# Patient Record
Sex: Female | Born: 1950
Health system: Southern US, Community
[De-identification: ages and names within clinical notes are randomized; demographics above are authoritative.]

## PROBLEM LIST (undated history)

## (undated) DIAGNOSIS — Z8051 Family history of malignant neoplasm of kidney: Secondary | ICD-10-CM

## (undated) DIAGNOSIS — F329 Major depressive disorder, single episode, unspecified: Secondary | ICD-10-CM

## (undated) DIAGNOSIS — Z803 Family history of malignant neoplasm of breast: Secondary | ICD-10-CM

## (undated) DIAGNOSIS — R945 Abnormal results of liver function studies: Secondary | ICD-10-CM

## (undated) DIAGNOSIS — R600 Localized edema: Secondary | ICD-10-CM

## (undated) DIAGNOSIS — R7303 Prediabetes: Secondary | ICD-10-CM

## (undated) DIAGNOSIS — K829 Disease of gallbladder, unspecified: Secondary | ICD-10-CM

## (undated) DIAGNOSIS — F419 Anxiety disorder, unspecified: Secondary | ICD-10-CM

## (undated) DIAGNOSIS — I10 Essential (primary) hypertension: Secondary | ICD-10-CM

## (undated) DIAGNOSIS — E785 Hyperlipidemia, unspecified: Secondary | ICD-10-CM

## (undated) DIAGNOSIS — R519 Headache, unspecified: Secondary | ICD-10-CM

## (undated) DIAGNOSIS — F32A Depression, unspecified: Secondary | ICD-10-CM

## (undated) DIAGNOSIS — K5900 Constipation, unspecified: Secondary | ICD-10-CM

## (undated) DIAGNOSIS — R413 Other amnesia: Secondary | ICD-10-CM

## (undated) DIAGNOSIS — H269 Unspecified cataract: Secondary | ICD-10-CM

## (undated) DIAGNOSIS — M170 Bilateral primary osteoarthritis of knee: Secondary | ICD-10-CM

## (undated) DIAGNOSIS — Z8042 Family history of malignant neoplasm of prostate: Secondary | ICD-10-CM

## (undated) DIAGNOSIS — R7989 Other specified abnormal findings of blood chemistry: Secondary | ICD-10-CM

## (undated) DIAGNOSIS — Z87898 Personal history of other specified conditions: Secondary | ICD-10-CM

## (undated) DIAGNOSIS — M8580 Other specified disorders of bone density and structure, unspecified site: Secondary | ICD-10-CM

## (undated) DIAGNOSIS — E669 Obesity, unspecified: Secondary | ICD-10-CM

## (undated) HISTORY — DX: Other specified disorders of bone density and structure, unspecified site: M85.80

## (undated) HISTORY — DX: Family history of malignant neoplasm of breast: Z80.3

## (undated) HISTORY — DX: Abnormal results of liver function studies: R94.5

## (undated) HISTORY — DX: Essential (primary) hypertension: I10

## (undated) HISTORY — DX: Family history of malignant neoplasm of kidney: Z80.51

## (undated) HISTORY — DX: Bilateral primary osteoarthritis of knee: M17.0

## (undated) HISTORY — DX: Other specified abnormal findings of blood chemistry: R79.89

## (undated) HISTORY — DX: Family history of malignant neoplasm of prostate: Z80.42

## (undated) HISTORY — DX: Personal history of other specified conditions: Z87.898

## (undated) HISTORY — DX: Localized edema: R60.0

## (undated) HISTORY — DX: Hyperlipidemia, unspecified: E78.5

## (undated) HISTORY — DX: Constipation, unspecified: K59.00

## (undated) HISTORY — DX: Obesity, unspecified: E66.9

## (undated) HISTORY — DX: Prediabetes: R73.03

## (undated) HISTORY — DX: Disease of gallbladder, unspecified: K82.9

## (undated) HISTORY — DX: Depression, unspecified: F32.A

---
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
## 1983-09-27 HISTORY — PX: ABDOMINAL HYSTERECTOMY: SHX81

## 1983-09-27 HISTORY — PX: CHOLECYSTECTOMY: SHX55

## 1983-09-27 HISTORY — PX: APPENDECTOMY: SHX54

## 1998-05-18 ENCOUNTER — Encounter: Admission: RE | Admit: 1998-05-18 | Discharge: 1998-05-18 | Payer: Self-pay | Admitting: *Deleted

## 2000-11-08 ENCOUNTER — Emergency Department (HOSPITAL_COMMUNITY): Admission: EM | Admit: 2000-11-08 | Discharge: 2000-11-08 | Payer: Self-pay | Admitting: Emergency Medicine

## 2000-11-20 ENCOUNTER — Encounter: Admission: RE | Admit: 2000-11-20 | Discharge: 2000-11-20 | Payer: Self-pay | Admitting: Internal Medicine

## 2000-12-19 ENCOUNTER — Ambulatory Visit (HOSPITAL_COMMUNITY): Admission: RE | Admit: 2000-12-19 | Discharge: 2000-12-19 | Payer: Self-pay | Admitting: Orthopedic Surgery

## 2000-12-26 ENCOUNTER — Encounter: Admission: RE | Admit: 2000-12-26 | Discharge: 2000-12-26 | Payer: Self-pay | Admitting: Orthopedic Surgery

## 2003-01-13 ENCOUNTER — Ambulatory Visit (HOSPITAL_COMMUNITY): Admission: RE | Admit: 2003-01-13 | Discharge: 2003-01-13 | Payer: Self-pay | Admitting: Obstetrics and Gynecology

## 2003-01-13 ENCOUNTER — Encounter: Payer: Self-pay | Admitting: Obstetrics and Gynecology

## 2004-09-08 ENCOUNTER — Ambulatory Visit: Payer: Self-pay | Admitting: Internal Medicine

## 2004-09-23 ENCOUNTER — Ambulatory Visit: Payer: Self-pay | Admitting: Internal Medicine

## 2004-09-29 ENCOUNTER — Ambulatory Visit: Payer: Self-pay | Admitting: *Deleted

## 2004-10-05 ENCOUNTER — Ambulatory Visit: Payer: Self-pay | Admitting: Internal Medicine

## 2004-10-11 ENCOUNTER — Ambulatory Visit: Payer: Self-pay | Admitting: Internal Medicine

## 2004-10-14 ENCOUNTER — Ambulatory Visit (HOSPITAL_COMMUNITY): Admission: RE | Admit: 2004-10-14 | Discharge: 2004-10-14 | Payer: Self-pay | Admitting: Family Medicine

## 2005-08-22 ENCOUNTER — Ambulatory Visit: Payer: Self-pay | Admitting: Internal Medicine

## 2005-08-29 ENCOUNTER — Ambulatory Visit: Payer: Self-pay | Admitting: Internal Medicine

## 2005-09-12 ENCOUNTER — Ambulatory Visit: Payer: Self-pay | Admitting: Internal Medicine

## 2005-10-03 ENCOUNTER — Ambulatory Visit: Payer: Self-pay | Admitting: Internal Medicine

## 2005-12-05 ENCOUNTER — Ambulatory Visit: Payer: Self-pay | Admitting: Internal Medicine

## 2006-01-09 ENCOUNTER — Ambulatory Visit (HOSPITAL_COMMUNITY): Admission: RE | Admit: 2006-01-09 | Discharge: 2006-01-09 | Payer: Self-pay | Admitting: Internal Medicine

## 2006-03-02 ENCOUNTER — Ambulatory Visit: Payer: Self-pay | Admitting: Internal Medicine

## 2006-07-31 DIAGNOSIS — I10 Essential (primary) hypertension: Secondary | ICD-10-CM | POA: Insufficient documentation

## 2006-09-15 ENCOUNTER — Encounter (INDEPENDENT_AMBULATORY_CARE_PROVIDER_SITE_OTHER): Payer: Self-pay | Admitting: Ophthalmology

## 2006-09-15 ENCOUNTER — Ambulatory Visit: Payer: Self-pay | Admitting: Internal Medicine

## 2006-09-15 LAB — CONVERTED CEMR LAB
BUN: 14 mg/dL (ref 6–23)
Chloride: 102 meq/L (ref 96–112)
Creatinine, Ser: 0.82 mg/dL (ref 0.40–1.20)

## 2006-09-30 DIAGNOSIS — E669 Obesity, unspecified: Secondary | ICD-10-CM | POA: Insufficient documentation

## 2006-09-30 DIAGNOSIS — E785 Hyperlipidemia, unspecified: Secondary | ICD-10-CM | POA: Insufficient documentation

## 2006-10-18 ENCOUNTER — Telehealth (INDEPENDENT_AMBULATORY_CARE_PROVIDER_SITE_OTHER): Payer: Self-pay | Admitting: *Deleted

## 2006-11-21 ENCOUNTER — Encounter (INDEPENDENT_AMBULATORY_CARE_PROVIDER_SITE_OTHER): Payer: Self-pay | Admitting: Ophthalmology

## 2006-11-21 ENCOUNTER — Ambulatory Visit (HOSPITAL_COMMUNITY): Admission: RE | Admit: 2006-11-21 | Discharge: 2006-11-21 | Payer: Self-pay | Admitting: Hospitalist

## 2006-11-21 ENCOUNTER — Ambulatory Visit: Payer: Self-pay | Admitting: Hospitalist

## 2006-12-12 ENCOUNTER — Encounter (INDEPENDENT_AMBULATORY_CARE_PROVIDER_SITE_OTHER): Payer: Self-pay | Admitting: Ophthalmology

## 2006-12-12 ENCOUNTER — Ambulatory Visit: Payer: Self-pay | Admitting: Internal Medicine

## 2006-12-12 LAB — CONVERTED CEMR LAB
BUN: 13 mg/dL (ref 6–23)
Calcium: 9.8 mg/dL (ref 8.4–10.5)
Cholesterol: 191 mg/dL (ref 0–200)
Potassium: 3.8 meq/L (ref 3.5–5.3)
Sodium: 141 meq/L (ref 135–145)
Triglycerides: 112 mg/dL (ref <150)
VLDL: 22 mg/dL (ref 0–40)

## 2007-01-18 ENCOUNTER — Telehealth (INDEPENDENT_AMBULATORY_CARE_PROVIDER_SITE_OTHER): Payer: Self-pay | Admitting: *Deleted

## 2007-01-28 ENCOUNTER — Inpatient Hospital Stay (HOSPITAL_COMMUNITY): Admission: EM | Admit: 2007-01-28 | Discharge: 2007-01-29 | Payer: Self-pay | Admitting: Emergency Medicine

## 2007-01-28 ENCOUNTER — Ambulatory Visit: Payer: Self-pay | Admitting: Internal Medicine

## 2007-01-29 ENCOUNTER — Encounter (INDEPENDENT_AMBULATORY_CARE_PROVIDER_SITE_OTHER): Payer: Self-pay | Admitting: Cardiology

## 2007-01-30 ENCOUNTER — Encounter (INDEPENDENT_AMBULATORY_CARE_PROVIDER_SITE_OTHER): Payer: Self-pay | Admitting: Internal Medicine

## 2007-01-30 DIAGNOSIS — R945 Abnormal results of liver function studies: Secondary | ICD-10-CM | POA: Insufficient documentation

## 2007-02-12 ENCOUNTER — Encounter (INDEPENDENT_AMBULATORY_CARE_PROVIDER_SITE_OTHER): Payer: Self-pay | Admitting: Ophthalmology

## 2007-02-12 ENCOUNTER — Ambulatory Visit: Payer: Self-pay | Admitting: Hospitalist

## 2007-02-12 DIAGNOSIS — R079 Chest pain, unspecified: Secondary | ICD-10-CM | POA: Insufficient documentation

## 2007-02-12 DIAGNOSIS — R1013 Epigastric pain: Secondary | ICD-10-CM | POA: Insufficient documentation

## 2007-02-12 DIAGNOSIS — D649 Anemia, unspecified: Secondary | ICD-10-CM | POA: Insufficient documentation

## 2007-02-12 LAB — CONVERTED CEMR LAB
AST: 18 units/L (ref 0–37)
BUN: 17 mg/dL (ref 6–23)
CO2: 28 meq/L (ref 19–32)
Calcium: 9.2 mg/dL (ref 8.4–10.5)
Chloride: 101 meq/L (ref 96–112)
Creatinine, Ser: 0.9 mg/dL (ref 0.40–1.20)
Glucose, Bld: 96 mg/dL (ref 70–99)

## 2007-02-13 ENCOUNTER — Encounter (INDEPENDENT_AMBULATORY_CARE_PROVIDER_SITE_OTHER): Payer: Self-pay | Admitting: Ophthalmology

## 2007-05-30 ENCOUNTER — Emergency Department (HOSPITAL_COMMUNITY): Admission: EM | Admit: 2007-05-30 | Discharge: 2007-05-31 | Payer: Self-pay | Admitting: Emergency Medicine

## 2007-07-17 ENCOUNTER — Telehealth (INDEPENDENT_AMBULATORY_CARE_PROVIDER_SITE_OTHER): Payer: Self-pay | Admitting: *Deleted

## 2007-11-06 ENCOUNTER — Ambulatory Visit: Payer: Self-pay | Admitting: Internal Medicine

## 2007-11-20 ENCOUNTER — Ambulatory Visit (HOSPITAL_COMMUNITY): Admission: RE | Admit: 2007-11-20 | Discharge: 2007-11-20 | Payer: Self-pay | Admitting: *Deleted

## 2007-12-26 DIAGNOSIS — M170 Bilateral primary osteoarthritis of knee: Secondary | ICD-10-CM

## 2007-12-26 HISTORY — DX: Bilateral primary osteoarthritis of knee: M17.0

## 2008-02-13 ENCOUNTER — Ambulatory Visit: Payer: Self-pay | Admitting: *Deleted

## 2008-02-27 ENCOUNTER — Encounter: Admission: RE | Admit: 2008-02-27 | Discharge: 2008-03-16 | Payer: Self-pay | Admitting: *Deleted

## 2008-02-27 ENCOUNTER — Encounter (INDEPENDENT_AMBULATORY_CARE_PROVIDER_SITE_OTHER): Payer: Self-pay | Admitting: *Deleted

## 2008-02-27 ENCOUNTER — Ambulatory Visit: Payer: Self-pay | Admitting: *Deleted

## 2008-02-28 LAB — CONVERTED CEMR LAB
ALT: 11 units/L (ref 0–35)
AST: 14 units/L (ref 0–37)
Albumin: 4.4 g/dL (ref 3.5–5.2)
CO2: 26 meq/L (ref 19–32)
Calcium: 9.6 mg/dL (ref 8.4–10.5)
Chloride: 104 meq/L (ref 96–112)
Cholesterol: 193 mg/dL (ref 0–200)
Potassium: 4 meq/L (ref 3.5–5.3)
Sodium: 143 meq/L (ref 135–145)
Total Protein: 7.4 g/dL (ref 6.0–8.3)

## 2008-03-05 ENCOUNTER — Ambulatory Visit: Payer: Self-pay | Admitting: Internal Medicine

## 2008-03-05 ENCOUNTER — Encounter (INDEPENDENT_AMBULATORY_CARE_PROVIDER_SITE_OTHER): Payer: Self-pay | Admitting: *Deleted

## 2008-04-07 ENCOUNTER — Ambulatory Visit (HOSPITAL_COMMUNITY): Admission: RE | Admit: 2008-04-07 | Discharge: 2008-04-07 | Payer: Self-pay | Admitting: *Deleted

## 2008-04-07 LAB — HM MAMMOGRAPHY: HM Mammogram: NEGATIVE

## 2008-08-01 ENCOUNTER — Telehealth (INDEPENDENT_AMBULATORY_CARE_PROVIDER_SITE_OTHER): Payer: Self-pay | Admitting: *Deleted

## 2009-08-14 ENCOUNTER — Telehealth (INDEPENDENT_AMBULATORY_CARE_PROVIDER_SITE_OTHER): Payer: Self-pay | Admitting: *Deleted

## 2009-09-16 ENCOUNTER — Telehealth (INDEPENDENT_AMBULATORY_CARE_PROVIDER_SITE_OTHER): Payer: Self-pay | Admitting: *Deleted

## 2009-09-29 ENCOUNTER — Ambulatory Visit: Payer: Self-pay | Admitting: Internal Medicine

## 2009-09-29 DIAGNOSIS — B372 Candidiasis of skin and nail: Secondary | ICD-10-CM | POA: Insufficient documentation

## 2009-09-29 LAB — CONVERTED CEMR LAB
Albumin: 4.4 g/dL (ref 3.5–5.2)
Alkaline Phosphatase: 110 units/L (ref 39–117)
BUN: 13 mg/dL (ref 6–23)
CO2: 25 meq/L (ref 19–32)
Cholesterol: 209 mg/dL — ABNORMAL HIGH (ref 0–200)
Glucose, Bld: 90 mg/dL (ref 70–99)
HDL: 67 mg/dL (ref >39)
Hemoglobin: 12.4 g/dL (ref 12.0–15.0)
LDL Cholesterol: 118 mg/dL — ABNORMAL HIGH (ref 0–99)
MCHC: 30.7 g/dL (ref 30.0–36.0)
MCV: 86 fL (ref >=78.0)
RBC: 4.7 M/uL (ref 3.87–5.11)
Total Bilirubin: 0.4 mg/dL (ref 0.3–1.2)
Total Protein: 7.1 g/dL (ref 6.0–8.3)
Triglycerides: 120 mg/dL (ref <150)
VLDL: 24 mg/dL (ref 0–40)
WBC: 6 10*3/uL (ref 4.0–10.5)

## 2010-10-14 ENCOUNTER — Telehealth: Payer: Self-pay | Admitting: Internal Medicine

## 2010-10-17 ENCOUNTER — Encounter: Payer: Self-pay | Admitting: Dermatology

## 2010-10-21 ENCOUNTER — Ambulatory Visit: Admission: RE | Admit: 2010-10-21 | Discharge: 2010-10-21 | Payer: Self-pay | Source: Home / Self Care

## 2010-10-21 DIAGNOSIS — L659 Nonscarring hair loss, unspecified: Secondary | ICD-10-CM | POA: Insufficient documentation

## 2010-10-21 LAB — CONVERTED CEMR LAB
Albumin: 4.3 g/dL (ref 3.5–5.2)
Alkaline Phosphatase: 109 units/L (ref 39–117)
BUN: 18 mg/dL (ref 6–23)
Basophils Relative: 1 % (ref 0–1)
CO2: 27 meq/L (ref 19–32)
Glucose, Bld: 90 mg/dL (ref 70–99)
Hemoglobin: 12.7 g/dL (ref 12.0–15.0)
Lymphocytes Relative: 45 % (ref 12–46)
Lymphs Abs: 3.6 10*3/uL (ref 0.7–4.0)
Monocytes Absolute: 0.7 10*3/uL (ref 0.1–1.0)
Monocytes Relative: 9 % (ref 3–12)
Neutro Abs: 3.5 10*3/uL (ref 1.7–7.7)
Neutrophils Relative %: 44 % (ref 43–77)
Potassium: 3.9 meq/L (ref 3.5–5.3)
RBC: 4.75 M/uL (ref 3.87–5.11)
Sodium: 140 meq/L (ref 135–145)
Total Protein: 7.3 g/dL (ref 6.0–8.3)
WBC: 8 10*3/uL (ref 4.0–10.5)

## 2010-10-25 ENCOUNTER — Telehealth: Payer: Self-pay | Admitting: Internal Medicine

## 2010-10-26 ENCOUNTER — Other Ambulatory Visit: Payer: Self-pay | Admitting: Internal Medicine

## 2010-10-26 DIAGNOSIS — Z1231 Encounter for screening mammogram for malignant neoplasm of breast: Secondary | ICD-10-CM

## 2010-10-26 DIAGNOSIS — Z1239 Encounter for other screening for malignant neoplasm of breast: Secondary | ICD-10-CM
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
## 2010-10-28 NOTE — Assessment & Plan Note (Signed)
Summary: CHECKUP/SB.   Vital Signs:  Patient profile:   60 year old female Height:      60 inches Weight:      205.4 pounds BMI:     40.26 Temp:     97.2 degrees F oral Pulse rate:   78 / minute BP sitting:   132 / 72  (right arm)  Vitals Entered By: Filomena Jungling NT II (September 29, 2009 9:41 AM) CC: rash on back of legs, under breast Is Patient Diabetic? No Pain Assessment Patient in pain? no      Nutritional Status BMI of > 30 = obese  Have you ever been in a relationship where you felt threatened, hurt or afraid?No   Does patient need assistance? Functional Status Self care Ambulation Normal   Primary Care Provider:  Olene Craven MD  CC:  rash on back of legs and under breast.  History of Present Illness: 60 yo woman with PMH as listed below who presents for regular followup:   HTN: On HCTZ. No complaints. Taking daily as prescribed.  Hx increased LFTs: Patient had one episode in May 2008 where AST was 189 ALT 131. This was of unclear etiology and has subsequently been normal.  Obesity: Patient reports having a healthier lifestyle and losing weight.  Hx Normocytic Anemia: Patient had a mild normocytic anemia [Hb  ~11] in May 2008 during a hospitalization. At a subsequent clinic visit she was referred to GI but never made it. She has had no problems with anemia since then and did not even remember being told she was anemic.  Preventive Health: Is S/P hysterectomy. Would like mammo referral.  Thinks her last tetatnus was within last 10 years. Refuses flu vaccine.  Is fasting today.  Patient also complains of a rash under her right breast and behind both knees. It seems to be worse when the weather is hot. She has tried OTC hydrocortisone without resolution.   No other complaints today.   Preventive Screening-Counseling & Management  Alcohol-Tobacco     Smoking Status: never  Caffeine-Diet-Exercise     Does Patient Exercise: yes     Type of exercise:  walking     Times/week: 3  Problems Prior to Update: 1)  Osteoarthritis, Knees, Bilateral  (ICD-715.96) 2)  Abdominal Pain, Epigastric  (ICD-789.06) 3)  Health Screening  (ICD-V70.0) 4)  Chest Pain  (ICD-786.50) 5)  Anemia, Normocytic  (ICD-285.9) 6)  Liver Function Tests, Abnormal  (ICD-794.8) 7)  Obesity Nos  (ICD-278.00) 8)  Hyperlipidemia  (ICD-272.4) 9)  Hypertension  (ICD-401.9)  Current Medications (verified): 1)  Hydrochlorothiazide 25 Mg Tabs (Hydrochlorothiazide) .... Take One Tablet By Mouth Once Daily  Allergies (verified): 1)  ! Darvocet-N 100 2)  ! Codeine  Review of Systems  The patient denies anorexia, fever, weight gain, vision loss, decreased hearing, hoarseness, chest pain, syncope, dyspnea on exertion, peripheral edema, prolonged cough, headaches, hemoptysis, abdominal pain, melena, hematochezia, severe indigestion/heartburn, hematuria, incontinence, genital sores, muscle weakness, transient blindness, difficulty walking, depression, abnormal bleeding, enlarged lymph nodes, and breast masses.         Has had weight loss intentionally from diet. See HPI regarding rash.   Physical Exam  General:  alert, well-developed, well-nourished, and well-hydrated.   Head:  normocephalic and atraumatic.   Eyes:  vision grossly intact, pupils equal, pupils round, and pupils reactive to light.   Ears:  R ear normal and L ear normal.   Nose:  no external deformity.   Lungs:  normal respiratory effort, no intercostal retractions, normal breath sounds, no crackles, and no wheezes.   Heart:  normal rate, regular rhythm, and no murmur.   Abdomen:  soft, non-tender, and normal bowel sounds.   Neurologic:  alert & oriented X3, cranial nerves II-XII intact, and strength normal in all extremities.   Skin:  Patient has well-demarcated but slightly scaling erythematous rash under right breast and behind both knees.  Psych:  Oriented X3, memory intact for recent and remote, normally  interactive, good eye contact, not anxious appearing, and not depressed appearing.     Impression & Recommendations:  Problem # 1:  HYPERTENSION (ICD-401.9) At goal today. Continue current regimen. Checking Bmet.  **Bmet WNL  Her updated medication list for this problem includes:    Hydrochlorothiazide 25 Mg Tabs (Hydrochlorothiazide) .Marland Kitchen... Take one tablet by mouth once daily  Orders: T-Comprehensive Metabolic Panel ([id number])  Problem # 2:  ANEMIA, NORMOCYTIC (ICD-285.9) Patient had very mild normocytic anemia in 2008 for which she was referred to GI for colonoscopy. SHe never made it to GI. She has had no GI bleeding that she knows of. WIll check CBC today. Am also referring to GI as she is ready for regular screening colonoscopy.   **Of note, CBC shows that patient is not anemic today.  Orders: T-CBC No Diff ([id number])  Problem # 3:  LIVER FUNCTION TESTS, ABNORMAL (ICD-794.8) As per HPI did have episode of elevated LFTs in past of unclear etilogy that since normalized. Checking Cmet today.  **LFTs WNL.   Problem # 4:  OBESITY NOS (ICD-278.00) PAtient has lost  ~30 pounds since 2008 through diet intentionally. This is great and I have encouraged her to keep up the good work.   Problem # 5:  HYPERLIPIDEMIA (ICD-272.4) Fasting today. Checking lipid panel and LFTs. LDLwas 115 in 02/2008 and is not on any lipid lowering therapy currently.  **LDL 118, at goal for her. No therapy needed at this time.   Orders: T-Lipid Profile [id number]) T-Comprehensive Metabolic Panel [phone])  Problem # 6:  Preventive Health Care (ICD-V70.0) Is S/P hysterectomy. Pelvic exam from 2009 documents that she does not have cervix. Does not need yearly PAP smear anymore. Would like mammo referral. Referred. Referred for colonoscopy. Thinks her last tetatnus was within last 10 years.  Refuses flu vaccine.  Problem # 7:  CANDIDIASIS, SKIN (ICD-112.3) Patient has rash that appears to  be fungal in nature under crease of right breast and behind both knees. I have given 4 weeks of Lotrisone and she will followup if the rash does not resolve in this time.   Complete Medication List: 1)  Hydrochlorothiazide 25 Mg Tabs (Hydrochlorothiazide) .... Take one tablet by mouth once daily 2)  Lotrisone 1-0.05 % Crea (Clotrimazole-betamethasone) .... Please apply to areas of rash twice daily for a total of 4 weeks as needed.  Other Orders: Gastroenterology Referral (GI) Mammogram (Screening) (Mammo)  Patient Instructions: 1)  Please followup in 6 months or sooner as needed. 2)  Please being all your medications to your next appointment as you did today. 3)  Please use the cream we supplied for 2-4 weeks. If the rash does not go away in 4 weeks call the clinic to be seen again.  Prescriptions: HYDROCHLOROTHIAZIDE 25 MG TABS (HYDROCHLOROTHIAZIDE) Take one tablet by mouth once daily  #31 x 11   Entered and Authorized by:   Aris Lot MD   Signed by:   Aris Lot MD on 09/29/2009   Method used:  Print then Give to Patient   RxID:   [id number] LOTRISONE 1-0.05 % CREA (CLOTRIMAZOLE-BETAMETHASONE) Please apply to areas of rash twice daily for a total of 4 weeks as needed.  #30 grams x 3   Entered and Authorized by:   Aris Lot MD   Signed by:   Aris Lot MD on 09/29/2009   Method used:   Print then Give to Patient   RxID:   [id number]   Prevention & Chronic Care Immunizations   Influenza vaccine: Not documented   Influenza vaccine deferral: Refused  (09/29/2009)    Tetanus booster: Not documented   Td booster deferral: Deferred  (09/29/2009)    Pneumococcal vaccine: Not documented  Colorectal Screening   Hemoccult: Not documented   Hemoccult action/deferral: Deferred  (09/29/2009)    Colonoscopy: Not documented   Colonoscopy action/deferral: GI referral  (09/29/2009)  Other Screening   Pap smear:  Specimen Adequacy: Satisfactory for  evaluation.   Interpretation/Result:Negative for intraepithelial Lesion or Malignancy.   Location: [facility].    (03/05/2008)   Pap smear action/deferral: Not indicated S/P hysterectomy  (09/29/2009)    Mammogram: Scattered fibroglandular densities. No specific mammographic evidence of malignancy.  Assessment: BIRADS 1.   (04/07/2008)   Mammogram action/deferral: Ordered  (09/29/2009)   Smoking status: never  (09/29/2009)  Lipids   Total Cholesterol: 193  (02/27/2008)   Lipid panel action/deferral: Lipid Panel ordered   LDL: 115  (02/27/2008)   LDL Direct: Not documented   HDL: 57  (02/27/2008)   Triglycerides: 106  (02/27/2008)    SGOT (AST): 14  (02/27/2008)   SGPT (ALT): 11  (02/27/2008) CMP ordered    Alkaline phosphatase: 114  (02/27/2008)   Total bilirubin: 0.4  (02/27/2008)    Lipid flowsheet reviewed?: Yes   Progress toward LDL goal: Unchanged  Hypertension   Last Blood Pressure: 132 / 72  (09/29/2009)   Serum creatinine: 0.84  (02/27/2008)   Serum potassium 4.0  (02/27/2008) CMP ordered     Hypertension flowsheet reviewed?: Yes   Progress toward BP goal: At goal  Self-Management Support :    Patient will work on the following items until the next clinic visit to reach self-care goals:     Medications and monitoring: take my medicines every day, check my blood pressure, bring all of my medications to every visit  (09/29/2009)     Eating: drink diet soda or water instead of juice or soda, eat more vegetables, use fresh or frozen vegetables, eat foods that are low in salt, eat baked foods instead of fried foods, eat fruit for snacks and desserts  (09/29/2009)     Activity: take a 30 minute walk every day  (09/29/2009)    Hypertension self-management support: Written self-care plan  (09/29/2009)   Hypertension self-care plan printed.    Lipid self-management support: Written self-care plan  (09/29/2009)   Lipid self-care plan  printed.   Nursing Instructions: Schedule screening mammogram (see order)    Process Orders Check Orders Results:     [facility]: ABN not required for this insurance Tests Sent for requisitioning (September 30, 2009 1:39 PM):     09/29/2009: [facility] -- T-Lipid Profile [phone] (signed)     09/29/2009: [facility] -- T-Comprehensive Metabolic Panel [[id number]] (signed)     09/29/2009: [facility] -- T-CBC No Diff [[id number]] (signed)

## 2010-10-28 NOTE — Progress Notes (Signed)
Summary: refill/ hla  Phone Note Refill Request Message from:  Patient on October 14, 2010 5:03 PM  Refills Requested: Medication #1:  HYDROCHLOROTHIAZIDE 25 MG TABS Take one tablet by mouth once daily   Dosage confirmed as above?Dosage Confirmed pt has not been seen in 1 yr, needs appt, sent flag to [facility] for appt  Initial call taken by: Marin Roberts RN,  October 14, 2010 5:03 PM  Follow-up for Phone Call        Needs appt Follow-up by: Julaine Fusi  DO,  October 15, 2010 12:43 PM    Prescriptions: HYDROCHLOROTHIAZIDE 25 MG TABS (HYDROCHLOROTHIAZIDE) Take one tablet by mouth once daily  #31 x 0   Entered and Authorized by:   Julaine Fusi  DO   Signed by:   Julaine Fusi  DO on 10/15/2010   Method used:   Electronically to        [facility]  [phone]* (retail)       [address]       [place], [place]  [address]       Ph: [phone] or [phone]       Fax: [phone]   RxID:   [id number]

## 2010-11-02 ENCOUNTER — Ambulatory Visit (HOSPITAL_COMMUNITY): Payer: Self-pay

## 2010-11-03 NOTE — Progress Notes (Signed)
Summary: refill/ hla  Phone Note Refill Request Message from:  Patient on October 25, 2010 9:56 AM  Refills Requested: Medication #1:  HYDROCHLOROTHIAZIDE 25 MG TABS Take one tablet by mouth once daily   Dosage confirmed as above?Dosage Confirmed saw dr Arvilla Market 1/26, states she ask for refills at appt  Initial call taken by: Marin Roberts RN,  October 25, 2010 9:56 AM  Follow-up for Phone Call        Rx sent to pharmacy Follow-up by: Nelda Bucks DO,  October 25, 2010 7:38 PM    Prescriptions: HYDROCHLOROTHIAZIDE 25 MG TABS (HYDROCHLOROTHIAZIDE) Take one tablet by mouth once daily  #30 x 0   Entered and Authorized by:   Nelda Bucks DO   Signed by:   Nelda Bucks DO on 10/25/2010   Method used:   Electronically to        [facility]  [phone]* (retail)       [address]       [place], [place]  [address]       Ph: [phone] or [phone]       Fax: [phone]   RxID:   [id number]

## 2010-11-15 ENCOUNTER — Ambulatory Visit (HOSPITAL_COMMUNITY)
Admission: RE | Admit: 2010-11-15 | Payer: Self-pay | Source: Ambulatory Visit | Attending: *Deleted | Admitting: *Deleted

## 2010-11-23 ENCOUNTER — Ambulatory Visit (HOSPITAL_COMMUNITY): Payer: Self-pay | Attending: *Deleted

## 2010-12-05 ENCOUNTER — Encounter: Payer: Self-pay | Admitting: Internal Medicine

## 2010-12-28 ENCOUNTER — Other Ambulatory Visit: Payer: Self-pay | Admitting: *Deleted

## 2010-12-29 MED ORDER — HYDROCHLOROTHIAZIDE 25 MG PO TABS: 25.0000 mg | ORAL_TABLET | Freq: Every day | ORAL | Status: DC

## 2011-02-08 NOTE — Discharge Summary (Signed)
Darlene [name], Darlene [name]               ACCOUNT NO.:  [id number]   MEDICAL RECORD NO.:  [id number]          PATIENT TYPE:  INP   LOCATION:  5501                         FACILITY:  [facility]   PHYSICIAN:  Duncan Dull, M.D.     DATE OF BIRTH:  Mar 14, 1951   DATE OF ADMISSION:  01/29/2007  DATE OF DISCHARGE:  01/29/2007                               DISCHARGE SUMMARY   DISCHARGE DIAGNOSES:  1. Epigastric and chest pain, likely related to gastroesophageal      reflux disease.  2. Elevated liver enzymes of unknown etiology.  3. Hypertension.  4. Anemia normocytic, likely iron deficiency.  5. Obesity.   LIST OF MEDICATION AT DISCHARGE:  1. Hydrochlorothiazide 25 mg p.o. q.daily.  2. Nitroglycerin 0.4 mg sublingual q. every five minutes x3.  3. Prevacid 30 mg po q daily for 6 weeks and prn afterwards.   INSTRUCTIONS:  The patient will have an appointment with Dr. Randon Goldsmith in  the outpatient clinic on Feb 12, 2007 at 4:00 p.m.  Patient was  instructed to come an hour earlier for a blood draw for C-Met.  At the  time of the appointment, Dr. Randon Goldsmith should check her liver function tests  and he should also refer the patient for a colonoscopy since she is 60  years old and for cardiac stress test or Cardiolite since she has  several coronary artery disease risk factors.   CONDITION AT DISCHARGE:  Improved.    The patient was instructed to follow a low sodium, heart-healthy diet.   There were no consultants during this admission.   PROCEDURES:  1. CXR: no active disease, no obstruction or free air, moderate feces      throughout colon and also showed no active lung disease and      cholecystectomy.  2. Abdominal U/S: prior cholecystectomy, portions of the pancreas not      well seen due to overlying bowel gas, otherwise unremarkable exam.   HISTORY OF PRESENT ILLNESS:  Pt is a 60 year old African-American woman  with a past medical history significant for hypertension and obesity who  presented  with a one-day history of chest and epigastric pain.  It  started at 9:30 p.m. the day prior to admission.  It was sharp, 8/10,  located in the central chest and epigastric area.  It was non-radiating,  associated with nausea.  Denied vomiting, diarrhea, fever or chills or  shortness of breath and denies previous episodes of chest pain or  epigastric pain.  Of note, she had a heavy meal at a restaurant on the  day of admission.  She took Alka-Seltzer and Pepto-Bismol without relief  and presented to the ED. Pt received nitroglycerin and aspirin which  relieved the chest pain, but the epigastric pain persisted.  SHE IS ALLERGIC TO DARVOCET.   VITAL SIGNS:  Temperature 97, blood pressure 126/58, pulse 78,  respiration rate 16, oxygen saturation 96% on room air, weight 225  pounds.  GENERAL EXAM:  She was obese, alert in mild respiratory distress.  EYES:  PERRLA/EOMI.  ENT:  Clear.  Well hydrated.  NECK:  Supple.  No JVD.  RESPIRATION:  Clear to auscultation bilaterally.  CARDIOVASCULAR:  Regular rate and rhythm.  S1-S2 positive.  GI:  Bowel sounds positive.  Mild epigastric tenderness without rebound.  EXTREMITIES:  No edema.  SKIN:  Normal exam.  NEURO EXAM:  Cranial nerves II-XII intact.  Motor and sensory intact.  Cerebellar function intact.  PSYCH:  Alert and oriented x3.   LABS:  Sodium 138, potassium 3.2, chloride 103, bicarb 31, BUN 16,  creatinine 0.9 and glucose 120.  Anion gap 3, bilirubin 0.4, alk phos  114, SGOT 159, SGPT 89, protein 65, albumin 3.4.  White blood count 7.1  with an ANC of 5.2, hemoglobin 11.6, hematocrit 35, MCV 81.1,  thrombocytes 338.  Lipase 25, reticulocyte 1.2, iron 26, TIBC 313,  percent saturation 8, B12 799, folate 8, ferritin 49, hemoglobin A1c  6.3, TSH 1.74.  Cholesterol 180, triglycerides 76, HDL 51, LDL 114, VLDL  15.  Cardiac enzymes negative x3.  Acute hepatitis panel negative.  Alcohol level less than 5.   EKG showed nonspecific ST-T  changes.   ASSESSMENT/PLAN:  1. Chest pain/epigastric pain.  Patient has multiple risk factors for      CAD (hypertension, obesity, hyperlipidemia) and was therefore      admitted to a Telemetry Unit and ruled out for acute ischemia with      serial EKGs and cardiac enzymes.  Fasting lipid profile was normal.      She should have an outpatient stress test given her multiple risk      factors.  She had no evidence of pancreatitis, choledocholithiasis, or GI bleed by  exam, serology or ultrasound.  Her pain resolved with PPI therapy and  she was asymptomatic at time of discharge.   1. Transaminitis.  Etiology was unclear, and GGT was mildly elevated      at 67, but ALT/AST were trending down by day of discharge. Viral      hepatitis panel was negative.  We considered  hemochromatosis to be      unlikely given her ferritin of 49 .  The patient reported a history      of using valerian root, which has been reported to increase the      LFTs, but claims that she stopped it several months ago.  She      denied Tylenol or alcohol use.  SPEP was nonspecific in pattern.      If transaminases are still elevated at followup, workup for      autoimmune causes of hepatitis is recommended.  This workup can      include antinuclear antibodies (ANA) for autoimmune hepatitis,      antimitochondrial antibodies (AMA) for primary biliary cirrhosis.      Along these lines, we have checked an ESR before she left and it is      normal, 17.   Since the patient is completely asymptomatic, we discharged her today  with close followup in outpatient with her primary care physician.  At  that time, the patient will have a C-Met done to follow the trend in  LFTs.  Also, the patient will need outpatient referral for colonoscopy  and cardiac stress test  due to increased number of risk factors for  coronary artery disease.  1. Hypertension.  She was well controlled in the hospital and we will      continue the  HCTZ in outpatient.   1. Anemia.  This is normocytic (see anemia panel above).  The patient      needs colonoscopy since she is over 35 years old.   1. Prophylaxis.  The patient was here on Protonix, GI cocktail and      Lovenox.   LABS AT DISCHARGE:  T-max 97.8, systolic blood pressure 114, diastolic  blood pressure 68, pulse 81, respiration rate 16, oxygen saturation 96%  on room air.  White blood count 5.3, hemoglobin 11.7, hematocrit 36.5, thrombocytes  318, sodium 139, potassium 4.3, chloride 103, bicarb 28, BUN 11,  creatinine 0.8, glucose 93, total bilirubin 0.4, alk phos 125, AST 57,  ALT 85, protein 6.9, albumin 3.6, calcium 9.5. ESR=17.  SPEP:  nonspecific pattern (see full report).      Carlus Pavlov, M.D.  Electronically Signed      Duncan Dull, M.D.  Electronically Signed    CG/MEDQ  D:  01/29/2007  T:  01/29/2007  Job:  [id number]

## 2011-10-03 ENCOUNTER — Encounter: Payer: Self-pay | Admitting: Internal Medicine

## 2011-10-03 ENCOUNTER — Ambulatory Visit (INDEPENDENT_AMBULATORY_CARE_PROVIDER_SITE_OTHER): Payer: Self-pay | Admitting: Internal Medicine

## 2011-10-03 DIAGNOSIS — B372 Candidiasis of skin and nail: Secondary | ICD-10-CM

## 2011-10-03 DIAGNOSIS — I10 Essential (primary) hypertension: Secondary | ICD-10-CM

## 2011-10-03 DIAGNOSIS — Z23 Encounter for immunization: Secondary | ICD-10-CM

## 2011-10-03 DIAGNOSIS — IMO0002 Reserved for concepts with insufficient information to code with codable children: Secondary | ICD-10-CM

## 2011-10-03 DIAGNOSIS — Z299 Encounter for prophylactic measures, unspecified: Secondary | ICD-10-CM

## 2011-10-03 DIAGNOSIS — M1710 Unilateral primary osteoarthritis, unspecified knee: Secondary | ICD-10-CM

## 2011-10-03 DIAGNOSIS — E785 Hyperlipidemia, unspecified: Secondary | ICD-10-CM

## 2011-10-03 DIAGNOSIS — R945 Abnormal results of liver function studies: Secondary | ICD-10-CM

## 2011-10-03 LAB — GLUCOSE, CAPILLARY
Glucose-Capillary: 96 mg/dL (ref 70–99)
Glucose-Capillary: 96 mg/dL (ref 70–99)

## 2011-10-03 MED ORDER — HYDROCHLOROTHIAZIDE 25 MG PO TABS: 25.0000 mg | ORAL_TABLET | Freq: Every day | ORAL | Status: DC

## 2011-10-03 MED ORDER — CLOTRIMAZOLE-BETAMETHASONE 1-0.05 % EX CREA: TOPICAL_CREAM | Freq: Two times a day (BID) | CUTANEOUS | Status: DC

## 2011-10-04 DIAGNOSIS — Z0000 Encounter for general adult medical examination without abnormal findings: Secondary | ICD-10-CM | POA: Insufficient documentation

## 2011-10-04 LAB — COMPREHENSIVE METABOLIC PANEL
ALT: 22 U/L (ref 0–35)
AST: 25 U/L (ref 0–37)
Albumin: 4.3 g/dL (ref 3.5–5.2)
Alkaline Phosphatase: 117 U/L (ref 39–117)
BUN: 18 mg/dL (ref 6–23)
CO2: 27 mEq/L (ref 19–32)
Calcium: 9.6 mg/dL (ref 8.4–10.5)
Chloride: 100 mEq/L (ref 96–112)
Creat: 0.75 mg/dL (ref 0.50–1.10)
Glucose, Bld: 97 mg/dL (ref 70–99)
Potassium: 3.8 mEq/L (ref 3.5–5.3)
Sodium: 139 mEq/L (ref 135–145)
Total Bilirubin: 0.3 mg/dL (ref 0.3–1.2)
Total Protein: 7.1 g/dL (ref 6.0–8.3)

## 2011-10-04 LAB — LIPID PANEL
Cholesterol: 190 mg/dL (ref 0–200)
HDL: 65 mg/dL (ref >39)
LDL Cholesterol: 104 mg/dL — ABNORMAL HIGH (ref 0–99)
Total CHOL/HDL Ratio: 2.9 Ratio
Triglycerides: 104 mg/dL (ref <150)
VLDL: 21 mg/dL (ref 0–40)

## 2011-10-04 NOTE — Assessment & Plan Note (Addendum)
Lipid panel was checked in 2011. We will recheck lipid panel today and make changes in therapy accordingly.  Update: Lipids at goal with no history of CAD or Diabetes. Patient is in not need any statins at this point. Lab Results  Component Value Date   CHOL 190 10/03/2011   CHOL 209* 09/29/2009   CHOL 193 02/27/2008   Lab Results  Component Value Date   HDL 65 10/03/2011   HDL 67 09/29/2009   HDL 57 02/27/2008   Lab Results  Component Value Date   LDLCALC 104* 10/03/2011   LDLCALC 118* 09/29/2009   LDLCALC 115* 02/27/2008   Lab Results  Component Value Date   TRIG 104 10/03/2011   TRIG 120 09/29/2009   TRIG 106 02/27/2008   Lab Results  Component Value Date   CHOLHDL 2.9 10/03/2011   CHOLHDL 3.1 Ratio 09/29/2009   CHOLHDL 3.4 Ratio 02/27/2008   No results found for this basename: LDLDIRECT

## 2011-10-04 NOTE — Assessment & Plan Note (Signed)
Tetanus vaccination given today Flu vaccine patient declined today Patient is due for mammogram and colonoscopy has no insurance. Patient may be eligible for a scholarship to receive mammogram for free. Was referred this to the next office visit with PCP.

## 2011-10-04 NOTE — Assessment & Plan Note (Signed)
Patient has history of osteoarthritis in the past. It is the most likely worsening. Recommended weight reduction and exercise. Patient would benefit physical therapy the patient has no insurance. She is trying to discussed with Jaynee Eagles if she would be eligible for an orange card.

## 2011-10-04 NOTE — Assessment & Plan Note (Addendum)
Blood pressure well controlled. Continue current regimen at this point. For risk stratification Will obtain hemoglobin A1c and lipid panel. Furthermore we'll obtain renal function for possible changes in management. Emphasized patient the importance of exercise and diet control. Patient has gained 4 pounds since her last office visit.  BP Readings from Last 3 Encounters:  10/03/11 127/73  10/21/10 127/78  09/29/09 132/72   Update 10/04/11: Hemoglobin A1c 5.7. Lipid at goal. Renal function and electrolytes for within normal limits.

## 2011-10-04 NOTE — Progress Notes (Signed)
  Subjective:   Patient ID: Darlene [name] female   DOB: 23-Nov-1950 61 y.o.   MRN: [id number]  HPI: [name] is a 61 y.o. female with possible medical history significant as outlined below who presented to the clinic for rectal office visit and refill on her medication. She has not been seen in the clinic for more than one year. Patient reports that she has been experiencing left knee pain which has been present for a while but now is worsening in character. The pain is worse with ambulating and in the evening time. It does not keep her awake during the night. She does take occasionally nonsteroidal which relieves the pain. She denies any trauma, redness, lesions but reports about swelling.  Otherwise patient's feels fine. Has no complaints. No DD visits or hospitalizations since last office visit. She reports that she joined the gym and is trying to do some exercise.    Past Medical History  Diagnosis Date  . Hypertension   . Hyperlipidemia   . Obesity   . History of chest pain     rulre out MI in 12/2006.  Marland Kitchen Elevated LFTs     history of, normal abdominal ultrasound, LFT's normal 01/2007   Current Outpatient Prescriptions  Medication Sig Dispense Refill  . clotrimazole-betamethasone (LOTRISONE) cream Apply topically 2 (two) times daily. Please apply to the areas of rash for a total of 4 weeks.  30 g  1  . hydrochlorothiazide (HYDRODIURIL) 25 MG tablet Take 1 tablet (25 mg total) by mouth daily.  90 tablet  1  Review of Systems: Constitutional: Denies fever, chills, diaphoresis, appetite change and fatigue.  HEENT: Denies photophobia, eye pain, redness, hearing loss, ear pain, congestion, sore throat, rhinorrhea, sneezing, mouth sores, trouble swallowing, neck pain, neck stiffness and tinnitus.   Respiratory: Denies SOB, DOE, cough, chest tightness,  and wheezing.   Cardiovascular: Denies chest pain, palpitations and leg swelling.  Gastrointestinal: Denies nausea, vomiting,  abdominal pain, diarrhea, constipation, blood in stool and abdominal distention.  Genitourinary: Denies dysuria, urgency, frequency, hematuria, flank pain and difficulty urinating.   Skin: Denies pallor, rash and wound.  Neurological: Denies dizziness,weakness, light-headedness, numbness and headaches.  Hematological: Denies adenopathy. Easy bruising, personal or family bleeding history    Objective:  Physical Exam: Filed Vitals:   10/03/11 1528  BP: 127/73  Pulse: 80  Temp: 97.7 F (36.5 C)  TempSrc: Oral  Height: 5' (1.524 m)  Weight: 225 lb 6.4 oz (102.241 kg)   Constitutional: Vital signs reviewed.  Patient is a well-developed and well-nourished woman  acute distress and cooperative with exam. Alert and oriented x3.  Ear: TM normal bilaterally Mouth: no erythema or exudates, MMM Eyes: PERRL, EOMI, conjunctivae normal, No scleral icterus.  Neck: Supple,  Cardiovascular: RRR, S1 normal, S2 normal,  pulses symmetric and intact bilaterally Pulmonary/Chest: CTAB, no wheezes, rales, or rhonchi Abdominal: Soft. Non-tender, non-distended, bowel sounds are normal,  GU: no CVA tenderness Musculoskeletal: No joint deformities, erythema, or stiffness, ROM full but mild tenderness on palpation on the anterior aspect of the shin just below the patella.  Neuro: Alert and orientedx3,  no focal motor deficit, sensory intact to light touch bilaterally.  Skin: Warm, dry and intact. No rash, cyanosis, or clubbing.

## 2011-10-04 NOTE — Assessment & Plan Note (Signed)
History of abnormal liver enzymes. Last year it was within normal range. We will recheck today.  Update 10/03/10: Liver Function within normal limits.

## 2011-12-12 ENCOUNTER — Encounter: Payer: Self-pay | Admitting: Internal Medicine

## 2012-01-23 ENCOUNTER — Encounter: Payer: Self-pay | Admitting: Internal Medicine

## 2012-01-23 ENCOUNTER — Ambulatory Visit (INDEPENDENT_AMBULATORY_CARE_PROVIDER_SITE_OTHER): Payer: Self-pay | Admitting: Internal Medicine

## 2012-01-23 VITALS — BP 120/78 | HR 97 | Temp 98.5°F | Ht 60.0 in | Wt 221.3 lb

## 2012-01-23 DIAGNOSIS — M1710 Unilateral primary osteoarthritis, unspecified knee: Secondary | ICD-10-CM

## 2012-01-23 DIAGNOSIS — L309 Dermatitis, unspecified: Secondary | ICD-10-CM

## 2012-01-23 DIAGNOSIS — Z0000 Encounter for general adult medical examination without abnormal findings: Secondary | ICD-10-CM

## 2012-01-23 DIAGNOSIS — B372 Candidiasis of skin and nail: Secondary | ICD-10-CM

## 2012-01-23 DIAGNOSIS — IMO0002 Reserved for concepts with insufficient information to code with codable children: Secondary | ICD-10-CM

## 2012-01-23 DIAGNOSIS — I10 Essential (primary) hypertension: Secondary | ICD-10-CM

## 2012-01-23 DIAGNOSIS — Z299 Encounter for prophylactic measures, unspecified: Secondary | ICD-10-CM

## 2012-01-23 DIAGNOSIS — Z1231 Encounter for screening mammogram for malignant neoplasm of breast: Secondary | ICD-10-CM

## 2012-01-23 MED ORDER — HYDROCORTISONE 0.5 % EX OINT: TOPICAL_OINTMENT | Freq: Two times a day (BID) | CUTANEOUS | Status: AC

## 2012-01-23 NOTE — Patient Instructions (Signed)
Please schedule a follow up appointment in 3-4 months. Please bring your medication bottles with your next appointment. Please take your medicines as prescribed.  

## 2012-01-23 NOTE — Progress Notes (Signed)
Application for mammogram scholarship faxed to [facility] - pt aware. Stanton Kidney Kayden Amend RN 01/23/12 3:50PM

## 2012-01-23 NOTE — Progress Notes (Signed)
  Subjective:    Patient ID: Darlene [name], female    DOB: Feb 16, 1951, 61 y.o.   MRN: [id number]  HPI: 61 year old woman with past medical history significant for hypertension, osteoarthritis comes to the clinic for a follow up visit.  States that she has constant knee pain. She was anticipating that it would hurt with the rain but fortunately that got better! States that she has been trying to lose weight and participates in aerobic exercises.   She has some skin breakdown in the posterior aspect of her left knee between the skin folds  and states she has been prescribed a cream in the past that works pretty well for her but was very expensive. Was wondering if he could substitute it with them cheaper formulations.    Review of Systems  Constitutional: Negative for fever, chills and fatigue.  Respiratory: Negative for cough, choking and stridor.   Cardiovascular: Negative for chest pain, palpitations and leg swelling.  Gastrointestinal: Negative for abdominal pain, diarrhea and blood in stool.  Genitourinary: Negative for dysuria, urgency, frequency and dyspareunia.  Musculoskeletal: Positive for arthralgias.  Neurological: Negative for headaches.       Objective:   Physical Exam  Constitutional: She is oriented to person, place, and time. She appears well-developed and well-nourished. No distress.  HENT:  Head: Normocephalic and atraumatic.  Mouth/Throat: No oropharyngeal exudate.  Eyes: Conjunctivae and EOM are normal. Pupils are equal, round, and reactive to light.  Neck: Normal range of motion. Neck supple. No JVD present. No tracheal deviation present. No thyromegaly present.  Cardiovascular: Normal rate, regular rhythm, normal heart sounds and intact distal pulses.  Exam reveals no gallop and no friction rub.   No murmur heard. Pulmonary/Chest: Effort normal and breath sounds normal. No stridor. No respiratory distress. She has no wheezes. She has no rales.  Abdominal: Soft.  Bowel sounds are normal. She exhibits no distension. There is no tenderness. There is no rebound.  Musculoskeletal: Normal range of motion. She exhibits no edema and no tenderness.       B/l knee crepitus. Skin maceration on the posterior aspect of the left knee  Lymphadenopathy:    She has no cervical adenopathy.  Neurological: She is alert and oriented to person, place, and time. She has normal reflexes. No cranial nerve deficit. Coordination normal.  Skin: Skin is warm. She is not diaphoretic.          Assessment & Plan:

## 2012-01-28 NOTE — Assessment & Plan Note (Signed)
Encouraged weight reduction. Tylenol for pain relief.

## 2012-01-28 NOTE — Assessment & Plan Note (Signed)
She states that Lotrisone cream was expensive for her but was wondering if we could prescribe something cheaper for her. - Gave her prescription for generic hydrocortisone to see if that is cheaper. She was advised to call us back after discussing with her pharmacy if there are any other cheaper alternatives.

## 2012-01-28 NOTE — Assessment & Plan Note (Signed)
Lab Results  Component Value Date   NA 139 10/03/2011   K 3.8 10/03/2011   CL 100 10/03/2011   CO2 27 10/03/2011   BUN 18 10/03/2011   CREATININE 0.75 10/03/2011   CREATININE 0.89 10/21/2010    BP Readings from Last 3 Encounters:  01/23/12 120/78  10/03/11 127/73  10/21/10 127/78    Assessment: Hypertension control:  controlled  Progress toward goals:  at goal Barriers to meeting goals:  no barriers identified  Plan: Hypertension treatment:  continue current medications

## 2012-01-28 NOTE — Assessment & Plan Note (Signed)
Mammogram scholarship form was given.

## 2012-02-21 ENCOUNTER — Ambulatory Visit (HOSPITAL_COMMUNITY)
Admission: RE | Admit: 2012-02-21 | Discharge: 2012-02-21 | Disposition: A | Discharge status: Home / Self Care | Payer: No Typology Code available for payment source | Source: Ambulatory Visit | Attending: Internal Medicine | Admitting: Internal Medicine

## 2012-02-21 DIAGNOSIS — Z1231 Encounter for screening mammogram for malignant neoplasm of breast: Secondary | ICD-10-CM

## 2012-04-09 ENCOUNTER — Other Ambulatory Visit: Payer: Self-pay | Admitting: *Deleted

## 2012-04-09 DIAGNOSIS — I10 Essential (primary) hypertension: Secondary | ICD-10-CM

## 2012-04-09 MED ORDER — HYDROCHLOROTHIAZIDE 25 MG PO TABS: 25.0000 mg | ORAL_TABLET | Freq: Every day | ORAL | Status: DC

## 2012-05-19 ENCOUNTER — Emergency Department (HOSPITAL_COMMUNITY)
Admission: EM | Admit: 2012-05-19 | Discharge: 2012-05-19 | Disposition: A | Discharge status: Home / Self Care | Payer: No Typology Code available for payment source | Attending: Emergency Medicine | Admitting: Emergency Medicine

## 2012-05-19 ENCOUNTER — Emergency Department (HOSPITAL_COMMUNITY): Payer: No Typology Code available for payment source

## 2012-05-19 ENCOUNTER — Encounter (HOSPITAL_COMMUNITY): Payer: Self-pay | Admitting: Emergency Medicine

## 2012-05-19 DIAGNOSIS — I10 Essential (primary) hypertension: Secondary | ICD-10-CM | POA: Insufficient documentation

## 2012-05-19 DIAGNOSIS — E669 Obesity, unspecified: Secondary | ICD-10-CM | POA: Insufficient documentation

## 2012-05-19 DIAGNOSIS — E785 Hyperlipidemia, unspecified: Secondary | ICD-10-CM | POA: Insufficient documentation

## 2012-05-19 DIAGNOSIS — Y92410 Unspecified street and highway as the place of occurrence of the external cause: Secondary | ICD-10-CM | POA: Insufficient documentation

## 2012-05-19 DIAGNOSIS — R51 Headache: Secondary | ICD-10-CM | POA: Insufficient documentation

## 2012-05-19 DIAGNOSIS — S8010XA Contusion of unspecified lower leg, initial encounter: Secondary | ICD-10-CM | POA: Insufficient documentation

## 2012-05-19 DIAGNOSIS — M79609 Pain in unspecified limb: Secondary | ICD-10-CM | POA: Insufficient documentation

## 2012-05-19 DIAGNOSIS — S8012XA Contusion of left lower leg, initial encounter: Secondary | ICD-10-CM

## 2012-05-19 DIAGNOSIS — S161XXA Strain of muscle, fascia and tendon at neck level, initial encounter: Secondary | ICD-10-CM

## 2012-05-19 DIAGNOSIS — Z9089 Acquired absence of other organs: Secondary | ICD-10-CM | POA: Insufficient documentation

## 2012-05-19 DIAGNOSIS — S139XXA Sprain of joints and ligaments of unspecified parts of neck, initial encounter: Secondary | ICD-10-CM | POA: Insufficient documentation

## 2012-05-19 DIAGNOSIS — M542 Cervicalgia: Secondary | ICD-10-CM | POA: Insufficient documentation

## 2012-05-19 MED ORDER — OXYCODONE-ACETAMINOPHEN 5-325 MG PO TABS: 1.0000 | ORAL_TABLET | ORAL | Status: AC | PRN

## 2012-05-19 MED ORDER — KETOROLAC TROMETHAMINE 60 MG/2ML IM SOLN
60.0000 mg | Freq: Once | INTRAMUSCULAR | Status: AC
  Administered 2012-05-19: 60 mg via INTRAMUSCULAR
  Filled 2012-05-19: qty 2

## 2012-05-19 MED ORDER — MORPHINE SULFATE 4 MG/ML IJ SOLN
6.0000 mg | Freq: Once | INTRAMUSCULAR | Status: DC
  Filled 2012-05-19: qty 2

## 2012-05-19 NOTE — ED Notes (Signed)
Pt encouraged to lay at a 30 degree angle and has denied. Patient also informed not to take her c collar off.
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
## 2012-05-19 NOTE — ED Notes (Signed)
Patient given discharge instructions, information, prescriptions, and diet order. Patient states that they adequately understand discharge information given and to return to ED if symptoms return or worsen.     
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
## 2012-05-22 ENCOUNTER — Ambulatory Visit (INDEPENDENT_AMBULATORY_CARE_PROVIDER_SITE_OTHER): Payer: Self-pay | Admitting: Internal Medicine

## 2012-05-22 ENCOUNTER — Encounter: Payer: Self-pay | Admitting: Internal Medicine

## 2012-05-22 DIAGNOSIS — M542 Cervicalgia: Secondary | ICD-10-CM
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
## 2012-10-11 ENCOUNTER — Other Ambulatory Visit: Payer: Self-pay | Admitting: *Deleted

## 2012-10-11 DIAGNOSIS — I10 Essential (primary) hypertension: Secondary | ICD-10-CM

## 2012-10-11 MED ORDER — HYDROCHLOROTHIAZIDE 25 MG PO TABS: 25.0000 mg | ORAL_TABLET | Freq: Every day | ORAL | Status: DC

## 2012-11-01 ENCOUNTER — Encounter: Payer: Self-pay | Admitting: Internal Medicine

## 2012-11-07 ENCOUNTER — Encounter: Payer: Self-pay | Admitting: Internal Medicine

## 2012-11-22 ENCOUNTER — Encounter: Payer: Self-pay | Admitting: Internal Medicine

## 2012-11-22 ENCOUNTER — Ambulatory Visit (INDEPENDENT_AMBULATORY_CARE_PROVIDER_SITE_OTHER): Payer: Self-pay | Admitting: Internal Medicine

## 2012-11-22 VITALS — BP 130/78 | HR 77 | Temp 97.0°F | Ht 60.0 in | Wt 218.7 lb

## 2012-11-22 DIAGNOSIS — M1710 Unilateral primary osteoarthritis, unspecified knee: Secondary | ICD-10-CM

## 2012-11-22 DIAGNOSIS — I10 Essential (primary) hypertension: Secondary | ICD-10-CM

## 2012-11-22 DIAGNOSIS — M1990 Unspecified osteoarthritis, unspecified site: Secondary | ICD-10-CM

## 2012-11-22 DIAGNOSIS — Z0000 Encounter for general adult medical examination without abnormal findings: Secondary | ICD-10-CM

## 2012-11-22 DIAGNOSIS — Z299 Encounter for prophylactic measures, unspecified: Secondary | ICD-10-CM

## 2012-11-22 DIAGNOSIS — IMO0002 Reserved for concepts with insufficient information to code with codable children: Secondary | ICD-10-CM

## 2012-11-22 LAB — LIPID PANEL
Cholesterol: 213 mg/dL — ABNORMAL HIGH (ref 0–200)
HDL: 71 mg/dL (ref >39)
LDL Cholesterol: 122 mg/dL — ABNORMAL HIGH (ref 0–99)
Triglycerides: 101 mg/dL (ref <150)

## 2012-11-22 LAB — CBC
MCHC: 33.2 g/dL (ref 30.0–36.0)
Platelets: 370 10*3/uL (ref 150–400)
RDW: 14.7 % (ref 11.5–15.5)
WBC: 7.4 10*3/uL (ref 4.0–10.5)

## 2012-11-22 MED ORDER — HYDROCHLOROTHIAZIDE 25 MG PO TABS: 25.0000 mg | ORAL_TABLET | Freq: Every day | ORAL | Status: DC

## 2012-11-22 NOTE — Assessment & Plan Note (Signed)
She does not have any insurance and is not interested in colonoscopy at this time. Would give her stool cards.

## 2012-11-22 NOTE — Assessment & Plan Note (Signed)
Patient has chronic knee pain but the left knee pain has been worse for last few weeks. On exam she had left knee crepitus but no swelling, redness or warmth. I think her pain is likely secondary to osteoarthritis. -Encouraged her on weight reduction. -Refer her to physical therapy. -She was educated that stretching exercises and water aerobics could be helpful for her. Encouraged her to enroll herself into some exercise program. -Tylenol/NSAIDs for pain relief

## 2012-11-22 NOTE — Patient Instructions (Addendum)
General Instructions: Please schedule a follow up appointment in 6 months or sooner if needed . Please bring your medication bottles with your next appointment. Please take your medicines as prescribed. I will call you with your lab results if anything will be abnormal.    Treatment Goals:  Goals (1 Years of Data) as of 11/22/12         As of Today 05/22/12 05/22/12 05/22/12 05/22/12     Blood Pressure    . Blood Pressure < 140/90  130/78 140/89 124/77 130/76 132/81      Progress Toward Treatment Goals:  Treatment Goal 11/22/2012  Blood pressure at goal    Self Care Goals & Plans:  Self Care Goal 11/22/2012  Manage my medications take my medicines as prescribed; bring my medications to every visit; refill my medications on time; follow the sick day instructions if I am sick  Monitor my health keep track of my blood pressure; keep track of my weight  Eat healthy foods eat more vegetables; eat fruit for snacks and desserts; eat smaller portions; eat baked foods instead of fried foods; drink diet soda or water instead of juice or soda  Be physically active take a walk every day; find an activity I enjoy       Care Management & Community Referrals:  Referral 11/22/2012  Referrals made for care management support none needed

## 2012-11-22 NOTE — Progress Notes (Signed)
Subjective:   Patient ID: Darlene [name] female   DOB: 31-Aug-1951 62 y.o.   MRN: [id number]  HPI: 62 year old woman with past medical history significant for hypertension, morbid obesity, osteoarthritis presents to the clinic for a followup visit.  Patient reports that she has been having increased pain in her left knee for last few weeks. She currently rates her pain 7/10, describes it as throbbing pain,  that gets worse when she has to stand up from sitting position( joint gets really stiff). Not associated with any swelling or redness. She states that she has mentioned about this pain to different healthcare providers at every visit but nobody pays attention to it.     Past Medical History  Diagnosis Date  . Hypertension   . Hyperlipidemia   . Obesity   . History of chest pain     rulre out MI in 12/2006.  Marland Kitchen Elevated LFTs     history of, normal abdominal ultrasound, LFT's normal 01/2007   No family history on file. History   Social History  . Marital Status: Single    Spouse Name: N/A    Number of Children: N/A  . Years of Education: N/A   Occupational History  . Not on file.   Social History Main Topics  . Smoking status: Never Smoker   . Smokeless tobacco: Not on file  . Alcohol Use: No  . Drug Use: No  . Sexually Active: Not on file   Other Topics Concern  . Not on file   Social History Narrative   Lives by herself.   Review of Systems: General: Denies fever, chills, diaphoresis, appetite change and fatigue. HEENT: Denies photophobia, eye pain, redness, hearing loss, ear pain, congestion, sore throat, rhinorrhea, sneezing, mouth sores, trouble swallowing, neck pain, neck stiffness and tinnitus. Respiratory: Denies SOB, DOE, cough, chest tightness, and wheezing. Cardiovascular: Denies to chest pain, palpitations and leg swelling. Gastrointestinal: Denies nausea, vomiting, abdominal pain, diarrhea, constipation, blood in stool and abdominal  distention. Genitourinary: Denies dysuria, urgency, frequency, hematuria, flank pain and difficulty urinating. Musculoskeletal: Denies myalgias, back pain, joint swelling, arthralgias and gait problem.  Skin: Denies pallor, rash and wound. Neurological: Denies dizziness, seizures, syncope, weakness, light-headedness, numbness and headaches. Hematological: Denies adenopathy, easy bruising, personal or family bleeding history. Psychiatric/Behavioral: Denies suicidal ideation, mood changes, confusion, nervousness, sleep disturbance and agitation.    Current Outpatient Medications: Current Outpatient Prescriptions  Medication Sig Dispense Refill  . clotrimazole-betamethasone (LOTRISONE) cream Apply topically 2 (two) times daily. Please apply to the areas of rash for a total of 4 weeks.  30 g  1  . hydrochlorothiazide (HYDRODIURIL) 25 MG tablet Take 1 tablet (25 mg total) by mouth daily.  90 tablet  1  . hydrocortisone ointment 0.5 % Apply topically 2 (two) times daily.  30 g  0   No current facility-administered medications for this visit.    Allergies: Allergies  Allergen Reactions  . Codeine   . Propoxyphene-Acetaminophen     REACTION: "makes me sick"      Objective:   Physical Exam: Filed Vitals:   11/22/12 1514  BP: 130/78  Pulse: 77  Temp: 97 F (36.1 C)    General: Vital signs reviewed and noted. Well-developed, well-nourished, in no acute distress; alert, appropriate and cooperative throughout examination. Head: Normocephalic, atraumatic Lungs: Normal respiratory effort. Clear to auscultation BL without crackles or wheezes. Heart: RRR. S1 and S2 normal without gallop, murmur, or rubs. Abdomen:BS normoactive. Soft, Nondistended, non-tender.  No masses  or organomegaly. Extremities: No pretibial edema. Left knee did not have any erythema, swelling , neither warm or tender to touch. Knee crepitus present.      Assessment & Plan:

## 2012-11-22 NOTE — Assessment & Plan Note (Signed)
Blood pressure at goal. Continue current medications. Check CMET.  

## 2012-11-23 LAB — COMPLETE METABOLIC PANEL WITH GFR
Albumin: 4.3 g/dL (ref 3.5–5.2)
BUN: 14 mg/dL (ref 6–23)
CO2: 28 mEq/L (ref 19–32)
Calcium: 9.8 mg/dL (ref 8.4–10.5)
GFR, Est African American: >89 mL/min
GFR, Est Non African American: >89 mL/min
Glucose, Bld: 90 mg/dL (ref 70–99)
Potassium: 3.9 mEq/L (ref 3.5–5.3)
Sodium: 140 mEq/L (ref 135–145)
Total Protein: 7.4 g/dL (ref 6.0–8.3)

## 2012-12-04 ENCOUNTER — Ambulatory Visit: Payer: Self-pay | Admitting: Physical Therapy

## 2012-12-19 NOTE — Addendum Note (Signed)
Addended by: Neomia Dear on: 12/19/2012 06:18 PM   Modules accepted: Orders

## 2013-01-01 ENCOUNTER — Other Ambulatory Visit: Payer: Self-pay | Admitting: *Deleted

## 2013-01-01 DIAGNOSIS — B372 Candidiasis of skin and nail: Secondary | ICD-10-CM

## 2013-01-01 MED ORDER — CLOTRIMAZOLE-BETAMETHASONE 1-0.05 % EX CREA: TOPICAL_CREAM | Freq: Two times a day (BID) | CUTANEOUS | Status: DC

## 2013-01-01 NOTE — Telephone Encounter (Signed)
Call pt when ready [phone]

## 2013-01-01 NOTE — Telephone Encounter (Signed)
Rx called in and pt informed 

## 2013-04-22 ENCOUNTER — Encounter: Payer: Self-pay | Admitting: Internal Medicine

## 2013-04-22 ENCOUNTER — Ambulatory Visit (INDEPENDENT_AMBULATORY_CARE_PROVIDER_SITE_OTHER): Payer: BC Managed Care – PPO | Admitting: Internal Medicine

## 2013-04-22 VITALS — BP 122/74 | HR 81 | Temp 98.4°F | Ht 60.0 in | Wt 220.4 lb

## 2013-04-22 DIAGNOSIS — I10 Essential (primary) hypertension: Secondary | ICD-10-CM

## 2013-04-22 DIAGNOSIS — R42 Dizziness and giddiness: Secondary | ICD-10-CM

## 2013-04-22 MED ORDER — HYDROCHLOROTHIAZIDE 25 MG PO TABS: 12.5000 mg | ORAL_TABLET | Freq: Every day | ORAL | Status: DC

## 2013-04-22 NOTE — Patient Instructions (Signed)
General Instructions:  Please decrease your blood pressure medicine to HCTZ 12.5mg  daily and monitor your blood pressure at home  If your symptoms continue or get worse, call the clinic and let us know [phone]  Remember to stand slowly and take a few seconds before taking your step  Treatment Goals:  Goals (1 Years of Data) as of 04/22/13         As of Today As of Today As of Today 11/22/12 05/22/12     Blood Pressure    . Blood Pressure < 140/90  122/74 114/80 126/82 130/78 140/89      Progress Toward Treatment Goals:  Treatment Goal 11/22/2012  Blood pressure at goal    Self Care Goals & Plans:  Self Care Goal 04/22/2013  Manage my medications take my medicines as prescribed; bring my medications to every visit  Monitor my health keep track of my blood pressure  Eat healthy foods -  Be physically active -    Care Management & Community Referrals:  Referral 11/22/2012  Referrals made for care management support none needed

## 2013-04-23 DIAGNOSIS — R42 Dizziness and giddiness: Secondary | ICD-10-CM | POA: Insufficient documentation

## 2013-04-23 NOTE — Assessment & Plan Note (Addendum)
x2 days, now improving.  Denies spinning sensation, vision changes, syncope, or tinnitus.  Does have mild headache.  Notices it most when she stood up from lying position.  Possibly secondary to mild orthostatic hypotension vs. Taking herbal supplements for weight loss and ?decreased po intake.  May also be mild viral etiology as she does admit to eating a sausage biscuit from [employer] when she usually doesn't eat any pork and had some nausea which has also since then resolved.   -stop herbal supplements, weight is actually up 2 pounds today -decrease hctz to 12.5mg  qd -monitor bp and follow up with pcp -stand up slowly from seated position and take your time before taking further steps -cautioned to monitor for worsening light-headedness, or presence of dizziness, near-syncope or syncope, chest pain, SOB, worsening headache, fever, chills, N/V/D, or abdominal pain and to call the clinic right away

## 2013-04-23 NOTE — Progress Notes (Signed)
Case discussed with Dr. Qureshi soon after the resident saw the patient.  We reviewed the resident's history and exam and pertinent patient test results.  I agree with the assessment, diagnosis, and plan of care documented in the resident's note. 

## 2013-04-23 NOTE — Assessment & Plan Note (Signed)
BP Readings from Last 3 Encounters:  04/22/13 122/74  11/22/12 130/78  05/22/12 140/89   Lab Results  Component Value Date   NA 140 11/22/2012   K 3.9 11/22/2012   CREATININE 0.72 11/22/2012   Assessment: Blood pressure control: controlled Progress toward BP goal:    Comments: noted to have mild orthostatic hypotension today  Plan: Medications:  decrease hctz to 12.5mg   Educational resources provided:   Self management tools provided:   Other plans: follow up in opc with pcp

## 2013-04-23 NOTE — Progress Notes (Signed)
Subjective:   Patient ID: Darlene [name] female   DOB: 06-Oct-1950 62 y.o.   MRN: [id number]  HPI: [name] is a 62 y.o. obese African American female with PMH of HTN and HL presenting to [facility] for aucte visit.  Ms. Darlene [name] reports feeling dizzy since yesterday but is much improved today.  She claims she woke up yesterday feeling "off balance".  She denied actual sensation of spinning or room spinning but said she just felt light-headed and off balance.  She denies any falls and had mild nausea which resolved after eating.  She noticed it more when she stood up from lying down.  Ms. Darlene [name] believes it may be due to multiple factors including sleeping under the fan the night before and she has also been taking herbal life supplements lately for weight loss, however, she has actually gained 2 pounds since her last visit.  She endorses drinking plenty of water daily and frequent urination and has been eating regularly and often "slipping up" on her diet and eating a high carb meal as well.  In [facility], her orthostatic vital signs were lying 122/76 with HR 81 and standing 114/80 with HR 105, therefore likely mild orthostasis is present and possibly secondary to her HCTZ.  She denies prior similar episodes, feels much improved today but still has a slight "different' feeling.  She also has a mild headache and has not taken any medication for her headache.  She denies any chest pain, SOB, abdominal pain, syncope or near syncope, falls, vertigo, tinnitus, changes in vision, N/V/D, or any urinary complaints.  Ms. Darlene [name] usually does not eat pork but does admit to eating a sausage biscuit from [employer] the day before which may also account for her feeling this way she believes and she had smile mild nausea which has since then resolved as well.  We discusses strategies for healthy weight loss instead of using herbal supplements that do not appear to be working.  Since she generally tries to follow a high protein  diet and drinks protein shakes at times during meals, it may be more helpful to try to cut down on her supplements and have stricter adherence to her low carb meals balancing her protein intake and also making sure she is drinking enough water.  She also admits to not exercising often, and I recommended trying to incorporate at least 30 minutes of exercise into her routine daily.    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  I advised her to take her time while standing and walking and if she notices worsening of her symptoms to let her pcp know right away which she says she understands.   Past Medical History  Diagnosis Date  . Hypertension   . Hyperlipidemia   . Obesity   . History of chest pain     rulre out MI in 12/2006.  Marland Kitchen Elevated LFTs     history of, normal abdominal ultrasound, LFT's normal 01/2007   Current Outpatient Prescriptions  Medication Sig Dispense Refill  . clotrimazole-betamethasone (LOTRISONE) cream Apply topically 2 (two) times daily. Please apply to the areas of rash for a total of 4 weeks.  30 g  1  . hydrochlorothiazide (HYDRODIURIL) 25 MG tablet Take 0.5 tablets (12.5 mg total) by mouth daily.  90 tablet  1  No current facility-administered medications for this visit.   No family history on file. History   Social History  . Marital Status: Single    Spouse Name: N/A    Number of Children: N/A  . Years of Education: N/A   Social History Main Topics  . Smoking status: Never Smoker   . Smokeless tobacco: None  . Alcohol Use: No  . Drug Use: No  . Sexually Active: None   Other Topics Concern  . None   Social History Narrative   Lives by herself.   Review of Systems:  Constitutional:  Denies fever, chills, diaphoresis, appetite change and fatigue.   HEENT:  Denies congestion, sore throat, rhinorrhea, sneezing, mouth sores, trouble  swallowing, neck pain   Respiratory:  Denies SOB, DOE, cough, and wheezing.   Cardiovascular:  Denies chest pain and leg swelling.   Gastrointestinal:  Nausea.  Denies vomiting, abdominal pain, diarrhea, constipation, blood in stool and abdominal distention.   Genitourinary:  Denies dysuria, urgency, frequency, hematuria, flank pain and difficulty urinating.   Musculoskeletal:  L knee and neck pain.  Denies back pain, joint swelling, and gait problem.   Skin:  Denies pallor, rash and wound.   Neurological:  Light-headedness, dizziness, mild headache.  Denies seizures, syncope, weakness, numbness.   Objective:  Physical Exam: Filed Vitals:   04/22/13 1519 04/22/13 1531 04/22/13 1533 04/22/13 1538  BP:  126/82 114/80 122/74  Pulse:  86 105 81  Temp:    98.4 F (36.9 C)  TempSrc:    Oral  Height: 5' (1.524 m)   5' (1.524 m)  Weight: 220 lb 1.6 oz (99.837 kg)   220 lb 6.4 oz (99.973 kg)   Vitals reviewed. General: sitting in chair, NAD HEENT: PERRL, EOMI, no scleral icterus Cardiac: RRR, no rubs, murmurs or gallops Pulm: clear to auscultation bilaterally, no wheezes, rales, or rhonchi Abd: soft, nontender, obese, nondistended, BS present Ext: warm and well perfused, no pedal edema, +2DP B/L Neuro: alert and oriented X3, cranial nerves II-XII grossly intact, strength and sensation to light touch equal in bilateral upper and lower extremities  Assessment & Plan:  Discussed with Dr. Meredith Pel Orthostatic hypotension--decreased hctz to 12.5mg

## 2013-07-29 ENCOUNTER — Encounter: Payer: Self-pay | Admitting: Internal Medicine

## 2013-09-02 ENCOUNTER — Encounter: Payer: Self-pay | Admitting: Internal Medicine

## 2013-10-15 ENCOUNTER — Encounter: Payer: Self-pay | Admitting: Internal Medicine

## 2013-10-15 ENCOUNTER — Ambulatory Visit (INDEPENDENT_AMBULATORY_CARE_PROVIDER_SITE_OTHER): Payer: BC Managed Care – PPO | Admitting: Internal Medicine

## 2013-10-15 VITALS — BP 120/80 | HR 80 | Temp 97.8°F | Ht 60.0 in | Wt 220.3 lb

## 2013-10-15 DIAGNOSIS — I10 Essential (primary) hypertension: Secondary | ICD-10-CM

## 2013-10-15 DIAGNOSIS — Z299 Encounter for prophylactic measures, unspecified: Secondary | ICD-10-CM

## 2013-10-15 DIAGNOSIS — J069 Acute upper respiratory infection, unspecified: Secondary | ICD-10-CM

## 2013-10-15 DIAGNOSIS — IMO0002 Reserved for concepts with insufficient information to code with codable children: Secondary | ICD-10-CM

## 2013-10-15 DIAGNOSIS — E785 Hyperlipidemia, unspecified: Secondary | ICD-10-CM

## 2013-10-15 DIAGNOSIS — B9789 Other viral agents as the cause of diseases classified elsewhere: Secondary | ICD-10-CM

## 2013-10-15 DIAGNOSIS — M1710 Unilateral primary osteoarthritis, unspecified knee: Secondary | ICD-10-CM

## 2013-10-15 LAB — CBC WITH DIFFERENTIAL/PLATELET
BASOS ABS: 0 10*3/uL (ref 0.0–0.1)
Basophils Relative: 1 % (ref 0–1)
EOS PCT: 2 % (ref 0–5)
Eosinophils Absolute: 0.1 10*3/uL (ref 0.0–0.7)
HEMATOCRIT: 38 % (ref 36.0–46.0)
HEMOGLOBIN: 12.5 g/dL (ref 12.0–15.0)
LYMPHS PCT: 45 % (ref 12–46)
Lymphs Abs: 3.1 10*3/uL (ref 0.7–4.0)
MCH: 26.7 pg (ref 26.0–34.0)
MCHC: 32.9 g/dL (ref 30.0–36.0)
MCV: 81 fL (ref 78.0–100.0)
MONO ABS: 0.6 10*3/uL (ref 0.1–1.0)
MONOS PCT: 9 % (ref 3–12)
Neutro Abs: 3 10*3/uL (ref 1.7–7.7)
Neutrophils Relative %: 43 % (ref 43–77)
Platelets: 397 10*3/uL (ref 150–400)
RBC: 4.69 MIL/uL (ref 3.87–5.11)
RDW: 14.6 % (ref 11.5–15.5)
WBC: 6.8 10*3/uL (ref 4.0–10.5)

## 2013-10-15 LAB — HEMOGLOBIN A1C
Hgb A1c MFr Bld: 5.9 % — ABNORMAL HIGH (ref <5.7)
Mean Plasma Glucose: 123 mg/dL — ABNORMAL HIGH (ref <117)

## 2013-10-15 MED ORDER — BENZONATATE 100 MG PO CAPS: 100.0000 mg | ORAL_CAPSULE | Freq: Three times a day (TID) | ORAL | Status: AC | PRN

## 2013-10-15 MED ORDER — HYDROCHLOROTHIAZIDE 25 MG PO TABS: 12.5000 mg | ORAL_TABLET | Freq: Every day | ORAL | Status: DC

## 2013-10-15 MED ORDER — BENZONATATE 100 MG PO CAPS: 100.0000 mg | ORAL_CAPSULE | Freq: Three times a day (TID) | ORAL | Status: DC | PRN

## 2013-10-15 NOTE — Assessment & Plan Note (Addendum)
Lab Results  Component Value Date   CHOL 213* 11/22/2012   HDL 71 11/22/2012   LDLCALC 122* 11/22/2012   TRIG 101 11/22/2012   CHOLHDL 3.0 11/22/2012   -check lipid panel -schedule follow-up as needed

## 2013-10-15 NOTE — Assessment & Plan Note (Addendum)
BP Readings from Last 3 Encounters:  10/15/13 120/80  04/22/13 122/74  11/22/12 130/78    Lab Results  Component Value Date   NA 140 11/22/2012   K 3.9 11/22/2012   CREATININE 0.72 11/22/2012    Assessment: Blood pressure control: controlled Progress toward BP goal:  at goal  Plan: Medications:  continue current medications Other plans: check CMP, cbc/diff, HA1c, lipid panel; schedule follow-up as needed

## 2013-10-15 NOTE — Assessment & Plan Note (Addendum)
Pt needs colonoscopy, zostavax, and pap smear.  She declined influenza vaccine.  -given prescription for zostavax, referral for colonoscopy -inquire about pap smear during next visit (may discontinue at age 63)- last pap wnl on 02/2008; no known h/o abnormal pap smears

## 2013-10-15 NOTE — Assessment & Plan Note (Addendum)
Pt has experienced URI symptoms that began 2 weeks ago and she was out of work.  She was feeling better and went back to work but her symptoms of cough, pharyngitis, returned after she was "in a room where there was a lot of smoke."  She has been afebrile however, and reports seasonal allergies.  She has difficulty sleeping when she is coughing.  On physical exam, TM's were wnl.  Posterior pharynx was mildly erythematous without exudates.  She had no cervical adenopathy and no sinus tenderness.    -continue symptomatic treatment -prescribed tessalon pearles (allergic to codeine) and naproxen for continued cough -advised to take benadryl that will help with allergic symptoms and also with sleep

## 2013-10-15 NOTE — Patient Instructions (Signed)
Please continue to take your medications as prescribed I have written you a prescription for a refill of your hydrochlorothiazide Please take naproxen along with tessalon pearles for your cough Please call the clinic if you are not feeling better within 10 days

## 2013-10-15 NOTE — Progress Notes (Signed)
Patient ID: Darlene [name], female   DOB: Jan 27, 1951, 63 y.o.   MRN: [id number]    Subjective:   Patient ID: Darlene [name] female    DOB: 1951-01-31 63 y.o.    MRN: [id number]  _________________________________________________  HPI: [name] is a 63 y.o. female here for an acute visit.  Pt has a PMH outlined below.  Please see problem-based assessment and plan for further details of medical issues addressed at today's visit.   PMH: Past Medical History  Diagnosis Date  . Hypertension   . Hyperlipidemia   . Obesity   . History of chest pain     rulre out MI in 12/2006.  Marland Kitchen Elevated LFTs     history of, normal abdominal ultrasound, LFT's normal 01/2007    Medications: Current Outpatient Prescriptions  Medication Sig Dispense Refill  . clotrimazole-betamethasone (LOTRISONE) cream Apply topically 2 (two) times daily. Please apply to the areas of rash for a total of 4 weeks.  30 g  1  . hydrochlorothiazide (HYDRODIURIL) 25 MG tablet Take 0.5 tablets (12.5 mg total) by mouth daily.  90 tablet  1   No current facility-administered medications for this visit.    Allergies: Allergies  Allergen Reactions  . Codeine   . Propoxyphene N-Acetaminophen     REACTION: "makes me sick"    FH: No family history on file.  SH: History   Social History  . Marital Status: Single    Spouse Name: N/A    Number of Children: N/A  . Years of Education: N/A   Social History Main Topics  . Smoking status: Never Smoker   . Smokeless tobacco: None  . Alcohol Use: No  . Drug Use: No  . Sexual Activity: None   Other Topics Concern  . None   Social History Narrative   Lives by herself.    Review of Systems: Constitutional: Denies fever/chills Respiratory: Denies SOB Cardiovascular: Denies CP, palpitations and leg swelling.  Gastrointestinal: Denies N/V/D/C, abdominal pain, and blood in stool. Genitourinary: Denies dysuria, frequency, hematuria, and difficulty urinating.    Endocrine: Denies: hot or cold intolerance, sweats, changes in hair or nails, polyuria, polydipsia. Musculoskeletal: Denies myalgias, back pain, joint swelling, arthralgias.  Skin: Denies pallor, rash and wound.  Neurological: Denies dizziness Hematological: Denies adenopathy. Easy bruising, personal or family bleeding history  Psychiatric/Behavioral: +sleep disturbance    Objective:  Physical Exam: Filed Vitals:   10/15/13 1415  BP: 120/80  Pulse: 80  Temp: 97.8 F (36.6 C)  TempSrc: Oral  Height: 5' (1.524 m)  Weight: 220 lb 4.8 oz (99.927 kg)  SpO2: 100%      BP Readings from Last 3 Encounters:  10/15/13 120/80  04/22/13 122/74  11/22/12 130/78   Physical Exam: Constitutional: Vital signs reviewed.  Patient is well-developed and well-nourished in no acute distress and cooperative with exam.  Head: Normocephalic and atraumatic. Eyes: PERRL, EOMI, conjunctivae normal, no scleral icterus.  Neck: Supple, Trachea midline, no JVD appreciated, or carotid bruit present.  Cardiovascular: RRR, no MRG, pulses symmetric and intact b/l. Pulmonary/Chest: normal respiratory effort, non-tender to palpation, CTAB, no wheezes, rales, or rhonchi. Abdominal: Obese/Thin. Soft. NT/ND +BS, no CVA tenderness. Neurological: A&O x3, cranial nerve II-XII are grossly intact, moving all extremities. Skin: Warm, dry and intact. No rash, cyanosis, or clubbing.  Psychiatric: Normal mood.   Recent Labs   HA1C Lab Results  Component Value Date   HGBA1C 5.7 10/03/2011    Lipid Panel Lab Results  Component Value Date   CHOL 213* 11/22/2012   HDL 71 11/22/2012   LDLCALC 122* 11/22/2012   TRIG 101 11/22/2012   CHOLHDL 3.0 11/22/2012    CMP     Component Value Date/Time   NA 140 11/22/2012 1606   K 3.9 11/22/2012 1606   CL 100 11/22/2012 1606   CO2 28 11/22/2012 1606   GLUCOSE 90 11/22/2012 1606   BUN 14 11/22/2012 1606   CREATININE 0.72 11/22/2012 1606   CREATININE 0.89 10/21/2010 2048   CALCIUM  9.8 11/22/2012 1606   PROT 7.4 11/22/2012 1606   ALBUMIN 4.3 11/22/2012 1606   AST 18 11/22/2012 1606   ALT 13 11/22/2012 1606   ALKPHOS 115 11/22/2012 1606   BILITOT 0.4 11/22/2012 1606    CBC    Component Value Date/Time   WBC 7.4 11/22/2012 1606   RBC 4.68 11/22/2012 1606   HGB 12.4 11/22/2012 1606   HCT 37.4 11/22/2012 1606   PLT 370 11/22/2012 1606   MCV 79.9 11/22/2012 1606   MCH 26.5 11/22/2012 1606   MCHC 33.2 11/22/2012 1606   RDW 14.7 11/22/2012 1606   LYMPHSABS 3.6 10/21/2010 2048   MONOABS 0.7 10/21/2010 2048   EOSABS 0.1 10/21/2010 2048   BASOSABS 0.1 10/21/2010 2048    Urinalysis No results found for this basename: colorurine, appearanceur, labspec, phurine, glucoseu, hgbur, bilirubinur, ketonesur, proteinur, urobilinogen, nitrite, leukocytesur    Imaging  Assessment & Plan:   Assessment and plan was discussed and formulated with my attending.

## 2013-10-15 NOTE — Assessment & Plan Note (Signed)
encouraged weight loss 

## 2013-10-16 ENCOUNTER — Encounter: Payer: Self-pay | Admitting: Internal Medicine

## 2013-10-16 LAB — COMPLETE METABOLIC PANEL WITH GFR
ALBUMIN: 4.2 g/dL (ref 3.5–5.2)
ALK PHOS: 108 U/L (ref 39–117)
ALT: 13 U/L (ref 0–35)
AST: 16 U/L (ref 0–37)
BILIRUBIN TOTAL: 0.3 mg/dL (ref 0.3–1.2)
BUN: 14 mg/dL (ref 6–23)
CO2: 29 mEq/L (ref 19–32)
Calcium: 9.7 mg/dL (ref 8.4–10.5)
Chloride: 102 mEq/L (ref 96–112)
Creat: 0.78 mg/dL (ref 0.50–1.10)
GFR, Est African American: >89 mL/min
GFR, Est Non African American: 83 mL/min
Glucose, Bld: 90 mg/dL (ref 70–99)
POTASSIUM: 4.3 meq/L (ref 3.5–5.3)
SODIUM: 137 meq/L (ref 135–145)
TOTAL PROTEIN: 7 g/dL (ref 6.0–8.3)

## 2013-10-16 LAB — LIPID PANEL
CHOLESTEROL: 187 mg/dL (ref 0–200)
HDL: 63 mg/dL (ref >39)
LDL CALC: 93 mg/dL (ref 0–99)
TRIGLYCERIDES: 153 mg/dL — AB (ref <150)
Total CHOL/HDL Ratio: 3 Ratio
VLDL: 31 mg/dL (ref 0–40)

## 2013-10-20 NOTE — Progress Notes (Signed)
Case discussed with Dr. Gill at time of visit.  We reviewed the resident's history and exam and pertinent patient test results.  I agree with the assessment, diagnosis, and plan of care documented in the resident's note. 

## 2014-02-24 ENCOUNTER — Encounter: Payer: BC Managed Care – PPO | Admitting: Internal Medicine

## 2014-11-03 ENCOUNTER — Other Ambulatory Visit: Payer: Self-pay | Admitting: Internal Medicine

## 2014-11-13 ENCOUNTER — Other Ambulatory Visit: Payer: Self-pay | Admitting: Internal Medicine

## 2014-11-13 DIAGNOSIS — B372 Candidiasis of skin and nail: Secondary | ICD-10-CM

## 2014-11-13 MED ORDER — CLOTRIMAZOLE-BETAMETHASONE 1-0.05 % EX CREA: TOPICAL_CREAM | Freq: Two times a day (BID) | CUTANEOUS | Status: DC

## 2014-11-13 NOTE — Telephone Encounter (Signed)
Refill lotrisone X 1, however, this needs to be addressed at next visit.  She has been on this for years without being addressed very thoroughly in clinic notes.

## 2014-12-22 ENCOUNTER — Ambulatory Visit (INDEPENDENT_AMBULATORY_CARE_PROVIDER_SITE_OTHER): Payer: Self-pay | Admitting: Internal Medicine

## 2014-12-22 ENCOUNTER — Encounter: Payer: Self-pay | Admitting: Internal Medicine

## 2014-12-22 VITALS — BP 120/80 | HR 77

## 2014-12-22 DIAGNOSIS — IMO0002 Reserved for concepts with insufficient information to code with codable children: Secondary | ICD-10-CM

## 2014-12-22 DIAGNOSIS — M170 Bilateral primary osteoarthritis of knee: Secondary | ICD-10-CM

## 2014-12-22 DIAGNOSIS — I10 Essential (primary) hypertension: Secondary | ICD-10-CM

## 2014-12-22 DIAGNOSIS — M545 Low back pain: Secondary | ICD-10-CM

## 2014-12-22 DIAGNOSIS — M1710 Unilateral primary osteoarthritis, unspecified knee: Secondary | ICD-10-CM

## 2014-12-22 DIAGNOSIS — Z299 Encounter for prophylactic measures, unspecified: Secondary | ICD-10-CM

## 2014-12-22 DIAGNOSIS — E785 Hyperlipidemia, unspecified: Secondary | ICD-10-CM

## 2014-12-22 LAB — CBC WITH DIFFERENTIAL/PLATELET
Basophils Absolute: 0.1 10*3/uL (ref 0.0–0.1)
Basophils Relative: 1 % (ref 0–1)
EOS ABS: 0.2 10*3/uL (ref 0.0–0.7)
EOS PCT: 2 % (ref 0–5)
HCT: 40.5 % (ref 36.0–46.0)
HEMOGLOBIN: 13.3 g/dL (ref 12.0–15.0)
LYMPHS ABS: 3.9 10*3/uL (ref 0.7–4.0)
Lymphocytes Relative: 48 % — ABNORMAL HIGH (ref 12–46)
MCH: 27 pg (ref 26.0–34.0)
MCHC: 32.8 g/dL (ref 30.0–36.0)
MCV: 82.2 fL (ref 78.0–100.0)
MONO ABS: 0.6 10*3/uL (ref 0.1–1.0)
MPV: 9.5 fL (ref 8.6–12.4)
Monocytes Relative: 7 % (ref 3–12)
Neutro Abs: 3.4 10*3/uL (ref 1.7–7.7)
Neutrophils Relative %: 42 % — ABNORMAL LOW (ref 43–77)
PLATELETS: 361 10*3/uL (ref 150–400)
RBC: 4.93 MIL/uL (ref 3.87–5.11)
RDW: 14.6 % (ref 11.5–15.5)
WBC: 8.1 10*3/uL (ref 4.0–10.5)

## 2014-12-22 LAB — COMPLETE METABOLIC PANEL WITH GFR
ALBUMIN: 4.3 g/dL (ref 3.5–5.2)
ALT: 11 U/L (ref 0–35)
AST: 17 U/L (ref 0–37)
Alkaline Phosphatase: 109 U/L (ref 39–117)
BUN: 18 mg/dL (ref 6–23)
CALCIUM: 10 mg/dL (ref 8.4–10.5)
CHLORIDE: 103 meq/L (ref 96–112)
CO2: 27 meq/L (ref 19–32)
Creat: 0.87 mg/dL (ref 0.50–1.10)
GFR, EST AFRICAN AMERICAN: 83 mL/min
GFR, EST NON AFRICAN AMERICAN: 72 mL/min
GLUCOSE: 91 mg/dL (ref 70–99)
POTASSIUM: 4.5 meq/L (ref 3.5–5.3)
Sodium: 142 mEq/L (ref 135–145)
Total Bilirubin: 0.3 mg/dL (ref 0.2–1.2)
Total Protein: 7.3 g/dL (ref 6.0–8.3)

## 2014-12-22 LAB — LIPID PANEL
CHOL/HDL RATIO: 2.8 ratio
Cholesterol: 198 mg/dL (ref 0–200)
HDL: 71 mg/dL (ref >=46)
LDL Cholesterol: 106 mg/dL — ABNORMAL HIGH (ref 0–99)
Triglycerides: 103 mg/dL (ref <150)
VLDL: 21 mg/dL (ref 0–40)

## 2014-12-22 NOTE — Patient Instructions (Addendum)
Thank you for your visit today.   Please return to the internal medicine clinic in  6 month(s) or sooner if needed.     Your current medical regimen is effective;  continue present plan and take all medications as prescribed.    I may start you on a cholesterol medication pending your lab results.  I will let you know.   I have made the following referrals for you: Physical Therapy for your knees.   You need the following test(s) for regular health maintenance: Will send you for a mammogram, bone density, and colonoscopy.    Please be sure to bring all of your medications with you to every visit; this includes herbal supplements, vitamins, eye drops, and any over-the-counter medications.   Should you have any questions regarding your medications and/or any new or worsening symptoms, please be sure to call the clinic at [phone].   If you believe that you are suffering from a life threatening condition or one that may result in the loss of limb or function, then you should call 911 or proceed to the nearest Emergency Department.     A healthy lifestyle and preventative care can promote health and wellness.   Maintain regular health, dental, and eye exams.  Eat a healthy diet. Foods like vegetables, fruits, whole grains, low-fat dairy products, and lean protein foods contain the nutrients you need without too many calories. Decrease your intake of foods high in solid fats, added sugars, and salt. Get information about a proper diet from your caregiver, if necessary.  Regular physical exercise is one of the most important things you can do for your health. Most adults should get at least 150 minutes of moderate-intensity exercise (any activity that increases your heart rate and causes you to sweat) each week. In addition, most adults need muscle-strengthening exercises on 2 or more days a week.   Maintain a healthy weight. The body mass index (BMI) is a screening tool to identify  possible weight problems. It provides an estimate of body fat based on height and weight. Your caregiver can help determine your BMI, and can help you achieve or maintain a healthy weight. For adults 20 years and older:  A BMI below 18.5 is considered underweight.  A BMI of 18.5 to 24.9 is normal.  A BMI of 25 to 29.9 is considered overweight.  A BMI of 30 and above is considered obese.  Osteoarthritis Osteoarthritis is a disease that causes soreness and inflammation of a joint. It occurs when the cartilage at the affected joint wears down. Cartilage acts as a cushion, covering the ends of bones where they meet to form a joint. Osteoarthritis is the most common form of arthritis. It often occurs in older people. The joints affected most often by this condition include those in the:  Ends of the fingers.  Thumbs.  Neck.  Lower back.  Knees.  Hips. CAUSES  Over time, the cartilage that covers the ends of bones begins to wear away. This causes bone to rub on bone, producing pain and stiffness in the affected joints.  RISK FACTORS Certain factors can increase your chances of having osteoarthritis, including:  Older age.  Excessive body weight.  Overuse of joints.  Previous joint injury. SIGNS AND SYMPTOMS   Pain, swelling, and stiffness in the joint.  Over time, the joint may lose its normal shape.  Small deposits of bone (osteophytes) may grow on the edges of the joint.  Bits of bone or cartilage  can break off and float inside the joint space. This may cause more pain and damage. DIAGNOSIS  Your health care provider will do a physical exam and ask about your symptoms. Various tests may be ordered, such as:  X-rays of the affected joint.  An MRI scan.  Blood tests to rule out other types of arthritis.  Joint fluid tests. This involves using a needle to draw fluid from the joint and examining the fluid under a microscope. TREATMENT  Goals of treatment are to control  pain and improve joint function. Treatment plans may include:  A prescribed exercise program that allows for rest and joint relief.  A weight control plan.  Pain relief techniques, such as:  Properly applied heat and cold.  Electric pulses delivered to nerve endings under the skin (transcutaneous electrical nerve stimulation [TENS]).  Massage.  Certain nutritional supplements.  Medicines to control pain, such as:  Acetaminophen.  Nonsteroidal anti-inflammatory drugs (NSAIDs), such as naproxen.  Narcotic or central-acting agents, such as tramadol.  Corticosteroids. These can be given orally or as an injection.  Surgery to reposition the bones and relieve pain (osteotomy) or to remove loose pieces of bone and cartilage. Joint replacement may be needed in advanced states of osteoarthritis. HOME CARE INSTRUCTIONS   Take medicines only as directed by your health care provider.  Maintain a healthy weight. Follow your health care provider's instructions for weight control. This may include dietary instructions.  Exercise as directed. Your health care provider can recommend specific types of exercise. These may include:  Strengthening exercises. These are done to strengthen the muscles that support joints affected by arthritis. They can be performed with weights or with exercise bands to add resistance.  Aerobic activities. These are exercises, such as brisk walking or low-impact aerobics, that get your heart pumping.  Range-of-motion activities. These keep your joints limber.  Balance and agility exercises. These help you maintain daily living skills.  Rest your affected joints as directed by your health care provider.  Keep all follow-up visits as directed by your health care provider. SEEK MEDICAL CARE IF:   Your skin turns red.  You develop a rash in addition to your joint pain.  You have worsening joint pain.  You have a fever along with joint or muscle aches. SEEK  IMMEDIATE MEDICAL CARE IF:  You have a significant loss of weight or appetite.  You have night sweats. FOR MORE INFORMATION   National Institute of Arthritis and Musculoskeletal and Skin Diseases: www.niams.http://www.myers.net/  [employer] on Aging: https://walker.com/  American College of Rheumatology: www.rheumatology.org Document Released: 09/12/2005 Document Revised: 01/27/2014 Document Reviewed: 05/20/2013 [facility] Patient Information 2015 [place], [place]. This information is not intended to replace advice given to you by your health care provider. Make sure you discuss any questions you have with your health care provider.

## 2014-12-23 ENCOUNTER — Encounter: Payer: Self-pay | Admitting: Internal Medicine

## 2014-12-24 LAB — HEMOGLOBIN A1C
Hgb A1c MFr Bld: 6.2 % — ABNORMAL HIGH (ref <5.7)
MEAN PLASMA GLUCOSE: 131 mg/dL — AB (ref <117)

## 2014-12-26 NOTE — Progress Notes (Signed)
Patient ID: Darlene [name], female   DOB: April 02, 1951, 64 y.o.   MRN: [id number]     Subjective:   Patient ID: Darlene [name] female    DOB: April 02, 1951 64 y.o.    MRN: [id number] Health Maintenance Due: Health Maintenance Due  Topic Date Due  . HIV Screening  11/16/1967  . COLONOSCOPY  11/15/2002  . PAP SMEAR  03/06/2011  . MAMMOGRAM  02/20/2014    _________________________________________________  HPI: Darlene [name] is a 64 y.o. female here for a routine visit.  Pt has a PMH outlined below.  Please see problem-based charting assessment and plan note for further details of medical issues addressed at today's visit.  PMH: Past Medical History  Diagnosis Date  . Hypertension   . Hyperlipidemia   . Obesity   . History of chest pain     rulre out MI in 12/2006.  Marland Kitchen. Elevated LFTs     history of, normal abdominal ultrasound, LFT's normal 01/2007    Medications: Current Outpatient Prescriptions on File Prior to Visit  Medication Sig Dispense Refill  . hydrochlorothiazide (HYDRODIURIL) 25 MG tablet TAKE ONE-HALF TABLET BY MOUTH ONCE DAILY 90 tablet 0   No current facility-administered medications on file prior to visit.    Allergies: Allergies  Allergen Reactions  . Codeine   . Propoxyphene N-Acetaminophen     REACTION: "makes me sick"    FH: No family history on file.  SH: History   Social History  . Marital Status: Single    Spouse Name: N/A  . Number of Children: N/A  . Years of Education: N/A   Social History Main Topics  . Smoking status: Never Smoker   . Smokeless tobacco: Not on file  . Alcohol Use: No  . Drug Use: No  . Sexual Activity: Not on file   Other Topics Concern  . None   Social History Narrative   Lives by herself.    Review of Systems: Constitutional: Negative for fever, chills and weight loss.  Eyes: Negative for blurred vision.  Respiratory: Negative for cough and shortness of breath.  Cardiovascular: Negative for chest  pain, palpitations and leg swelling.  Gastrointestinal: Negative for nausea, vomiting, abdominal pain, diarrhea, constipation and blood in stool.  Genitourinary: Negative for dysuria, urgency and frequency.  Musculoskeletal: Negative for myalgias and +back pain.  Neurological: Negative for dizziness, weakness and headaches.     Objective:   Vital Signs: Filed Vitals:   12/22/14 1723  BP: 120/80  Pulse: 77     BP Readings from Last 3 Encounters:  12/22/14 120/80  10/15/13 120/80  04/22/13 122/74    Physical Exam: Constitutional: Vital signs reviewed.  Patient is in NAD and cooperative with exam.  Head: Normocephalic and atraumatic. Eyes: EOMI, conjunctivae nl, no scleral icterus.  Neck: Supple. Cardiovascular: RRR, no MRG. Pulmonary/Chest: normal effort, CTAB, no wheezes, rales, or rhonchi. Abdominal: Soft. NT/ND +BS. Musculoskeletal: knees: no erythema, deformity, or swelling; mild crepitus upon ROM.   Neurological: A&O x3, cranial nerves II-XII are grossly intact, moving all extremities. Extremities: 2+DP b/l; no pitting edema. Skin: Warm, dry and intact. No rash.   Assessment & Plan:   Assessment and plan was discussed and formulated with my attending.

## 2014-12-26 NOTE — Assessment & Plan Note (Signed)
BP Readings from Last 3 Encounters:  12/22/14 120/80  10/15/13 120/80  04/22/13 122/74    Lab Results  Component Value Date   NA 142 12/22/2014   K 4.5 12/22/2014   CREATININE 0.87 12/22/2014    Assessment: Blood pressure control:  at goal  Comments: Pt wonders if HCTZ is still working; BP initially elevated but on recheck was wnl  Plan: Medications:  continue current medications Self management tools provided:  requested she log her BP daily for about 2 weeks  Other plans: check labs, including LP and HA1c

## 2014-12-26 NOTE — Assessment & Plan Note (Addendum)
-  mammogram ordered -refer to GI for colonoscopy  -will need to schedule for pap smear (will ask Mamie to send info for pap smear clinic) -DEXA ordered

## 2014-12-26 NOTE — Assessment & Plan Note (Addendum)
Lab Results  Component Value Date   CHOL 198 12/22/2014   HDL 71 12/22/2014   LDLCALC 106* 12/22/2014   TRIG 103 12/22/2014   CHOLHDL 2.8 12/22/2014   Pt 10 yr ASCVD is 5.7%.  Moderate statin could be initiated but would favor weight loss, exercise, and nutrition.  Her HDL is also offering protection.   -will discuss exercise, weight loss, and nutrition -would not begin statin therapy at this point

## 2014-12-26 NOTE — Assessment & Plan Note (Signed)
Pt c/o lower back and knee pain.  Has h/o OA of the knees.  No red flags.   -ordered PT -symptomatic tx with APAP

## 2014-12-30 NOTE — Progress Notes (Signed)
Internal Medicine Clinic Attending Date of visit: 12/22/2014  Case discussed with Dr. Gill soon after the resident saw the patient on the day of the visit.  We reviewed the resident's history and exam and pertinent patient test results.  I agree with the assessment, diagnosis, and plan of care documented in the resident's note. 

## 2014-12-31 ENCOUNTER — Ambulatory Visit (HOSPITAL_COMMUNITY): Payer: 59

## 2015-01-01 ENCOUNTER — Ambulatory Visit (HOSPITAL_COMMUNITY)
Admission: RE | Admit: 2015-01-01 | Discharge: 2015-01-01 | Disposition: A | Discharge status: Home / Self Care | Payer: 59 | Source: Ambulatory Visit | Attending: Internal Medicine | Admitting: Internal Medicine

## 2015-01-01 DIAGNOSIS — Z13820 Encounter for screening for osteoporosis: Secondary | ICD-10-CM | POA: Diagnosis present

## 2015-01-01 DIAGNOSIS — M1710 Unilateral primary osteoarthritis, unspecified knee: Secondary | ICD-10-CM

## 2015-01-01 DIAGNOSIS — Z299 Encounter for prophylactic measures, unspecified: Secondary | ICD-10-CM

## 2015-01-01 DIAGNOSIS — M179 Osteoarthritis of knee, unspecified: Secondary | ICD-10-CM | POA: Insufficient documentation

## 2015-01-01 DIAGNOSIS — IMO0002 Reserved for concepts with insufficient information to code with codable children: Secondary | ICD-10-CM

## 2015-02-19 ENCOUNTER — Other Ambulatory Visit: Payer: Self-pay | Admitting: Internal Medicine

## 2015-03-02 ENCOUNTER — Ambulatory Visit: Payer: 59 | Attending: Internal Medicine

## 2015-03-02 DIAGNOSIS — M25662 Stiffness of left knee, not elsewhere classified: Secondary | ICD-10-CM | POA: Diagnosis not present

## 2015-03-02 DIAGNOSIS — M25562 Pain in left knee: Secondary | ICD-10-CM | POA: Diagnosis not present

## 2015-03-02 DIAGNOSIS — M6289 Other specified disorders of muscle: Secondary | ICD-10-CM | POA: Diagnosis not present

## 2015-03-02 DIAGNOSIS — M25661 Stiffness of right knee, not elsewhere classified: Secondary | ICD-10-CM | POA: Diagnosis not present

## 2015-03-02 DIAGNOSIS — R29898 Other symptoms and signs involving the musculoskeletal system: Secondary | ICD-10-CM

## 2015-03-02 DIAGNOSIS — M25561 Pain in right knee: Secondary | ICD-10-CM | POA: Diagnosis present

## 2015-03-02 DIAGNOSIS — R262 Difficulty in walking, not elsewhere classified: Secondary | ICD-10-CM | POA: Diagnosis present
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
## 2015-03-05 NOTE — Addendum Note (Signed)
Addended by: Neomia Dear on: 03/05/2015 07:10 AM   Modules accepted: Orders

## 2015-03-09 ENCOUNTER — Ambulatory Visit: Payer: 59

## 2015-03-09 DIAGNOSIS — M25561 Pain in right knee: Secondary | ICD-10-CM | POA: Diagnosis not present

## 2015-03-09 DIAGNOSIS — M25562 Pain in left knee: Principal | ICD-10-CM

## 2015-03-09 DIAGNOSIS — M25661 Stiffness of right knee, not elsewhere classified: Secondary | ICD-10-CM

## 2015-03-09 DIAGNOSIS — R29898 Other symptoms and signs involving the musculoskeletal system: Secondary | ICD-10-CM

## 2015-03-09 DIAGNOSIS — M25662 Stiffness of left knee, not elsewhere classified: Secondary | ICD-10-CM

## 2015-03-09 NOTE — Therapy (Signed)
[facility] [address] [place], [place], [address] Phone: [phone]   Fax:  [phone]  Physical Therapy Treatment  Patient Details  Name: Darlene [name] MRN: [id number] Date of Birth: [birth year] Referring Provider:  Marrian Salvage, MD  Encounter Date: 03/09/2015      PT End of Session - 03/09/15 1503    Visit Number 2   Number of Visits 12   PT Start Time 0215   PT Stop Time 0300   PT Time Calculation (min) 45 min   Activity Tolerance Patient tolerated treatment well   Behavior During Therapy [facility] for tasks assessed/performed      Past Medical History  Diagnosis Date  . Hypertension   . Hyperlipidemia   . Obesity   . History of chest pain     rulre out MI in 12/2006.  Marland Kitchen Elevated LFTs     history of, normal abdominal ultrasound, LFT's normal 01/2007    Past Surgical History  Procedure Laterality Date  . Abdominal hysterectomy    . Cholecystectomy      There were no vitals filed for this visit.  Visit Diagnosis:  Arthralgia of both knees  Weakness of both hips  Stiffness of both knees                       [facility] Adult PT Treatment/Exercise - 03/09/15 1416    Exercises   Exercises Knee/Hip   Knee/Hip Exercises: Aerobic   Stationary Bike Nustep L4 6 min   Knee/Hip Exercises: Standing   Wall Squat 1 set;15 reps;3 seconds   Knee/Hip Exercises: Supine   Short Arc Quad Sets Strengthening;Right;Left;1 set;15 reps   Short Arc Quad Sets Limitations 5 sec hold   Straight Leg Raises AROM;Right;Left;1 set;10 reps   Knee/Hip Exercises: Sidelying   Hip ABduction AROM;Right;Left;1 set;15 reps   Knee/Hip Exercises: Prone   Hamstring Curl 1 set;15 reps  RT and LT   Modalities   Modalities Ultrasound;Iontophoresis   Iontophoresis   Type of Iontophoresis Dexamethasone   Location medial RT and LT knee   Dose 1cc   Time 4 hour patch                PT Education - 03/09/15 1442    Education provided Yes   Education Details HEP, and removal criteria for ionto   Person(s) Educated Patient   Methods Tactile cues;Verbal cues;Handout   Comprehension Returned demonstration          PT Short Term Goals - 03/02/15 1610    PT SHORT TERM GOAL #1   Title Pain improved 20% with walking .    Time 3   Period Weeks   Status New   PT SHORT TERM GOAL #2   Title Independent with inital HEP    Time 3   Period Weeks   Status New           PT Long Term Goals - 03/02/15 1611    PT LONG TERM GOAL #1   Title She will be independent with all HEP issued as of last visit   Time 6   Period Weeks   Status New   PT LONG TERM GOAL #2   Title she will report pain improved 50% with walking in community   Time 6   Period Weeks   Status New   PT LONG TERM GOAL #3   Title She will improve active LT knee extension tpo -5 degrees  to ease pain with walking   Time 6   Period Weeks   Status New               Plan - 03/09/15 1503    Clinical Impression Statement She did well with minor cues for position and each exercies. Continue ionto and exercise if no pain increase   PT Next Visit Plan Review HEP , modalities , STM with tool, tape, ionto , [place]   PT Home Exercise Plan HEP issued today   Consulted and Agree with Plan of Care Patient        Problem List Patient Active Problem List   Diagnosis Date Noted  . Light headedness 04/23/2013  . Preventive measure 10/04/2011  . Osteoarthrosis, unspecified whether generalized or localized, involving lower leg 12/26/2007  . Dyslipidemia 09/30/2006  . OBESITY NOS 09/30/2006  . Essential hypertension 07/31/2006    Caprice Red PT 03/09/2015, 3:05 PM  [facility] [address] [place], [place], [address] Phone: [phone]   Fax:  [phone]

## 2015-03-09 NOTE — Patient Instructions (Addendum)
Hip Extension (Prone) Quadriceps Set (Prone)   With toes supporting lower legs, tighten thigh muscles to straighten knees. Hold _5___ seconds. Relax. Repeat __15-20__ times per set. Do __1-2__ sets per session. Do __1-2__ sessions per day.  Knee Flexion   Bend knee as far as possible. Use ____ lbs on ankle. Repeat with other leg. Repeat _15-25___ times. Do __1-2__ sessions per day.  RT and LT knee   Copyright  VHI. All rights reserved.  Abduction: Side Leg Lift (Eccentric) - Side-Lying   Lie on side. Lift top leg slightly higher than shoulder level. Keep top leg straight with body, toes pointing forward. Slowly lower for 3-5 seconds. ___ reps per set, ___ sets per day, ___ days per week. Add ___ lbs when you achieve ___ repetitions.  Copyright  VHI. All rights reserved.  Strengthening: Straight Leg Raise (Phase 1)   Tighten muscles on front of right thigh, then lift leg ____ inches from surface, keeping knee locked.  Repeat ____ times per set. Do ____ sets per session. Do ____ sessions per day.  http://orth.exer.us/614   Copyright  VHI. All rights reserved.    Copyright  VHI. All rights reserved.  HIP: Flexion / KNEE: Extension, Straight Leg Raise  Copyright  VHI. All rights reserved.  Heel Slide   Bend knee and pull heel toward buttocks. Hold _15-30___ seconds. Return. Repeat with other knee. Repeat __2__ times. Do __1-2__ sessions per day.  http://gt2.exer.us/372   Copyright  VHI. All rights reserved.     Raise leg until knee is straight. 10--25___ reps per set, ___1-2 sets per day, ___ days per week  Copyright  VHI. All rights reserved.  Short Arc [employer] a large can or rolled towel under leg. Straighten knee and leg. Hold _5___ seconds. Repeat with other leg. Repeat 15-25____ times. Do _1-2___ sessions per day.  http://gt2.exer.us/365   Copyright  VHI. All rights reserved.  Quad Set   Slowly tighten muscles on thigh of straight leg  while counting out loud to 5-10 ____. Repeat with other leg. Repeat __10-20__ times. Do _many ___ sessions per day.  http://gt2.exer.us/361   Copyright  VHI. All rights reserved.  Asked  Pt to remove patch  if irritating or in 4 hours if not. Also educated on how ionto works with medication delevery.

## 2015-03-10 ENCOUNTER — Ambulatory Visit: Payer: 59

## 2015-03-10 DIAGNOSIS — M25561 Pain in right knee: Secondary | ICD-10-CM

## 2015-03-10 DIAGNOSIS — M25562 Pain in left knee: Principal | ICD-10-CM

## 2015-03-10 DIAGNOSIS — R29898 Other symptoms and signs involving the musculoskeletal system: Secondary | ICD-10-CM

## 2015-03-10 NOTE — Patient Instructions (Signed)
Remove patches in 4 hours

## 2015-03-10 NOTE — Therapy (Signed)
[facility] [address] [place], [place], [address] Phone: [phone]   Fax:  [phone]  Physical Therapy Treatment  Patient Details  Name: Darlene [name] MRN: [id number] Date of Birth: 1951/01/18 Referring Provider:  Marrian Salvage, MD  Encounter Date: 03/10/2015      PT End of Session - 03/10/15 1446    Visit Number 3   Number of Visits 12   PT Start Time 0205   PT Stop Time 0250   PT Time Calculation (min) 45 min   Activity Tolerance Patient tolerated treatment well   Behavior During Therapy [facility] for tasks assessed/performed      Past Medical History  Diagnosis Date  . Hypertension   . Hyperlipidemia   . Obesity   . History of chest pain     rulre out MI in 12/2006.  Marland Kitchen Elevated LFTs     history of, normal abdominal ultrasound, LFT's normal 01/2007    Past Surgical History  Procedure Laterality Date  . Abdominal hysterectomy    . Cholecystectomy      There were no vitals filed for this visit.  Visit Diagnosis:  Arthralgia of both knees  Weakness of both hips      Subjective Assessment - 03/10/15 1444    Subjective Did well with patches. Felt better after removal but after workng pain returned. Very sensitive to touch in medial RT knee   Currently in Pain? Yes   Pain Score 5                          [facility] Adult PT Treatment/Exercise - 03/10/15 1412    Knee/Hip Exercises: Aerobic   Stationary Bike Nustep L5 6 min   Knee/Hip Exercises: Seated   Long Arc Quad Strengthening;Right;Left;1 set   [employer] Weight 3 lbs.  12 reps   Knee/Hip Exercises: Supine   Straight Leg Raises AROM;Right;Left;1 set;10 reps   Knee/Hip Exercises: Sidelying   Hip ABduction AROM;Right;Left;1 set;10 reps   Knee/Hip Exercises: Prone   Hamstring Curl 1 set;10 reps  3 pounds   Ultrasound   Ultrasound Location 100% 3 MHz 1.2 Wcm2   Ultrasound Parameters medical RT and Lt knee   Ultrasound Goals  Pain   Iontophoresis   Type of Iontophoresis Dexamethasone   Location medial RT and LT knee   Dose 1cc   Time 4 hour patch                PT Education - 03/09/15 1442    Education provided Yes   Education Details HEP, and removal criteria for ionto   Person(s) Educated Patient   Methods Tactile cues;Verbal cues;Handout   Comprehension Returned demonstration          PT Short Term Goals - 03/02/15 1610    PT SHORT TERM GOAL #1   Title Pain improved 20% with walking .    Time 3   Period Weeks   Status New   PT SHORT TERM GOAL #2   Title Independent with inital HEP    Time 3   Period Weeks   Status New           PT Long Term Goals - 03/02/15 1611    PT LONG TERM GOAL #1   Title She will be independent with all HEP issued as of last visit   Time 6   Period Weeks   Status New   PT LONG TERM  GOAL #2   Title she will report pain improved 50% with walking in community   Time 6   Period Weeks   Status New   PT LONG TERM GOAL #3   Title She will improve active LT knee extension tpo -5 degrees to ease pain with walking   Time 6   Period Weeks   Status New               Plan - 03/10/15 1453    Clinical Impression Statement She did the HEP well .She was tender to pressure from [place] head medial Rt knee. Tolerated ionto so reapplied today. She understands t remove in 4 hours.  No skin irritation.    PT Next Visit Plan [place] and ionto, STW with blade trial light to medial knees, possible kineseotape , ice massage   Consulted and Agree with Plan of Care Patient        Problem List Patient Active Problem List   Diagnosis Date Noted  . Light headedness 04/23/2013  . Preventive measure 10/04/2011  . Osteoarthrosis, unspecified whether generalized or localized, involving lower leg 12/26/2007  . Dyslipidemia 09/30/2006  . OBESITY NOS 09/30/2006  . Essential hypertension 07/31/2006    Caprice Red PT 03/10/2015, 2:57 PM  [facility] [address] [place], [place], [address] Phone: [phone]   Fax:  [phone]

## 2015-03-17 ENCOUNTER — Ambulatory Visit: Payer: 59

## 2015-03-17 DIAGNOSIS — M25562 Pain in left knee: Principal | ICD-10-CM

## 2015-03-17 DIAGNOSIS — R262 Difficulty in walking, not elsewhere classified: Secondary | ICD-10-CM

## 2015-03-17 DIAGNOSIS — R29898 Other symptoms and signs involving the musculoskeletal system: Secondary | ICD-10-CM

## 2015-03-17 DIAGNOSIS — M25662 Stiffness of left knee, not elsewhere classified: Secondary | ICD-10-CM

## 2015-03-17 DIAGNOSIS — M25561 Pain in right knee: Secondary | ICD-10-CM

## 2015-03-17 DIAGNOSIS — M25661 Stiffness of right knee, not elsewhere classified: Secondary | ICD-10-CM
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
## 2015-03-25 ENCOUNTER — Ambulatory Visit: Payer: 59

## 2015-03-31 ENCOUNTER — Ambulatory Visit: Payer: 59 | Attending: Internal Medicine

## 2015-04-01 ENCOUNTER — Ambulatory Visit: Payer: 59

## 2015-04-06 ENCOUNTER — Ambulatory Visit: Payer: 59

## 2015-04-07 ENCOUNTER — Ambulatory Visit: Payer: 59

## 2015-04-13 ENCOUNTER — Ambulatory Visit: Payer: 59

## 2015-04-21 ENCOUNTER — Encounter: Payer: 59 | Admitting: Physical Therapy

## 2015-04-23 ENCOUNTER — Encounter: Payer: 59 | Admitting: Physical Therapy

## 2015-05-20 ENCOUNTER — Other Ambulatory Visit: Payer: Self-pay | Admitting: Internal Medicine

## 2015-05-20 MED ORDER — HYDROCHLOROTHIAZIDE 25 MG PO TABS: 12.5000 mg | ORAL_TABLET | Freq: Every day | ORAL | Status: DC

## 2015-05-20 NOTE — Telephone Encounter (Signed)
Pt called requesting bp med to be filled @ [facility] on [address].

## 2015-07-05 ENCOUNTER — Encounter: Payer: Self-pay | Admitting: Internal Medicine

## 2015-07-05 DIAGNOSIS — M8580 Other specified disorders of bone density and structure, unspecified site: Secondary | ICD-10-CM

## 2015-07-05 HISTORY — DX: Other specified disorders of bone density and structure, unspecified site: M85.80

## 2015-07-06 ENCOUNTER — Encounter: Payer: 59 | Admitting: Internal Medicine

## 2015-07-06 ENCOUNTER — Encounter: Payer: Self-pay | Admitting: Internal Medicine

## 2015-11-20 ENCOUNTER — Telehealth: Payer: Self-pay | Admitting: Internal Medicine

## 2015-11-20 NOTE — Telephone Encounter (Signed)
APPT REMINDER CALL, LMTCB IF SHE NEEDS TO CANCEL °

## 2015-11-23 ENCOUNTER — Encounter: Payer: Self-pay | Admitting: Internal Medicine

## 2015-11-29 ENCOUNTER — Encounter: Payer: Self-pay | Admitting: Internal Medicine

## 2015-11-30 ENCOUNTER — Ambulatory Visit: Payer: Self-pay | Admitting: Internal Medicine

## 2015-11-30 DIAGNOSIS — R7303 Prediabetes: Secondary | ICD-10-CM

## 2015-11-30 HISTORY — DX: Prediabetes: R73.03

## 2015-11-30 NOTE — Assessment & Plan Note (Deleted)
A: Dyslipidemia: 6.2% risk of CVD, no indication for statin. P: -no indication for statin -lipid panel today
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
## 2015-12-01 ENCOUNTER — Other Ambulatory Visit: Payer: Self-pay | Admitting: Internal Medicine

## 2015-12-01 ENCOUNTER — Encounter: Payer: Self-pay | Admitting: Internal Medicine

## 2015-12-01 MED ORDER — HYDROCHLOROTHIAZIDE 12.5 MG PO TABS: 12.5000 mg | ORAL_TABLET | Freq: Every day | ORAL | Status: DC

## 2015-12-01 NOTE — Progress Notes (Signed)
Patient ID: Darlene [name], female   DOB: August 31, 1951, 65 y.o.   MRN: [id number]  A user error has taken place: encounter opened in error, closed for administrative reasons.

## 2015-12-03 ENCOUNTER — Other Ambulatory Visit: Payer: Self-pay | Admitting: *Deleted

## 2015-12-03 ENCOUNTER — Other Ambulatory Visit: Payer: Self-pay | Admitting: Internal Medicine

## 2015-12-03 NOTE — Telephone Encounter (Signed)
Pt was upset, she does not use [facility], please send a new script asap

## 2015-12-03 NOTE — Telephone Encounter (Signed)
Pt requesting hydrochlorothiazide to be filled @ [facility] on [address]. Please call pt back.

## 2015-12-03 NOTE — Telephone Encounter (Signed)
Have sent request through a new request

## 2015-12-05 NOTE — Telephone Encounter (Signed)
Patient cancelled her appointment with me on Monday.  Since she has not been seen since 2016 and I agreed to refill her BP medication for 1 month.  [facility].  Please call in her HCTZ 12.5mg  to the pharmacy of her choosing.  Thanks.

## 2015-12-30 ENCOUNTER — Ambulatory Visit (INDEPENDENT_AMBULATORY_CARE_PROVIDER_SITE_OTHER): Payer: Self-pay | Admitting: Internal Medicine

## 2015-12-30 ENCOUNTER — Encounter: Payer: Self-pay | Admitting: Internal Medicine

## 2015-12-30 VITALS — BP 124/58 | HR 89 | Temp 98.4°F | Wt 199.6 lb

## 2015-12-30 DIAGNOSIS — Z111 Encounter for screening for respiratory tuberculosis: Secondary | ICD-10-CM

## 2015-12-30 DIAGNOSIS — M8580 Other specified disorders of bone density and structure, unspecified site: Secondary | ICD-10-CM

## 2015-12-30 DIAGNOSIS — R7303 Prediabetes: Secondary | ICD-10-CM

## 2015-12-30 DIAGNOSIS — E669 Obesity, unspecified: Secondary | ICD-10-CM

## 2015-12-30 DIAGNOSIS — I10 Essential (primary) hypertension: Secondary | ICD-10-CM

## 2015-12-30 DIAGNOSIS — M170 Bilateral primary osteoarthritis of knee: Secondary | ICD-10-CM

## 2015-12-30 DIAGNOSIS — Z299 Encounter for prophylactic measures, unspecified: Secondary | ICD-10-CM

## 2015-12-30 DIAGNOSIS — E785 Hyperlipidemia, unspecified: Secondary | ICD-10-CM

## 2015-12-30 DIAGNOSIS — Z6838 Body mass index (BMI) 38.0-38.9, adult: Secondary | ICD-10-CM

## 2015-12-30 LAB — POCT GLYCOSYLATED HEMOGLOBIN (HGB A1C): Hemoglobin A1C: 5.7

## 2015-12-30 LAB — GLUCOSE, CAPILLARY: Glucose-Capillary: 85 mg/dL (ref 65–99)

## 2015-12-30 NOTE — Assessment & Plan Note (Addendum)
She still c/o b/l knee pain. On exam she has crepitus upon passive movement.  She has been using ibuprofen for pain but I suggested to try tylenol instead.  I offered injections but she declined.  She has also been using an OTC gel for her knees that seems to help. She has also been to PT in the past.  -cont to monitor -d/c ibuprofen and try tylenol  -cont exercises

## 2015-12-30 NOTE — Progress Notes (Signed)
Patient ID: Darlene [name], female   DOB: Nov 08, 1950, 65 y.o.   MRN: [id number]     Subjective:   Patient ID: Darlene EeBarbara I [name] female    DOB: Nov 08, 1950 65 y.o.    MRN: [id number] Health Maintenance Due: Health Maintenance Due  Topic Date Due  . Hepatitis C Screening  0Feb 13, 1952  . HIV Screening  11/16/1967  . COLONOSCOPY  11/15/2002  . PAP SMEAR  03/06/2011  . HEMOGLOBIN A1C  06/26/2015    _________________________________________________  HPI: Darlene [name] is a 65 y.o. female here for a routine f/u visit for HTN.  Pt has a PMH outlined below.  Please see problem-based charting assessment and plan for further status of patient's chronic medical problems addressed at today's visit.  PMH: Past Medical History  Diagnosis Date  . Hypertension   . Hyperlipidemia   . Obesity   . History of chest pain     rulre out MI in 12/2006.  Marland Kitchen. Elevated LFTs     history of, normal abdominal ultrasound, LFT's normal 01/2007    Medications: Current Outpatient Prescriptions on File Prior to Visit  Medication Sig Dispense Refill  . hydrochlorothiazide (HYDRODIURIL) 12.5 MG tablet Take 1 tablet (12.5 mg total) by mouth daily. 30 tablet 0   No current facility-administered medications on file prior to visit.    Allergies: Allergies  Allergen Reactions  . Codeine   . Propoxyphene N-Acetaminophen     REACTION: "makes me sick"    FH: No family history on file.  SH: Social History   Social History  . Marital Status: Single    Spouse Name: N/A  . Number of Children: N/A  . Years of Education: N/A   Social History Main Topics  . Smoking status: Never Smoker   . Smokeless tobacco: Not on file  . Alcohol Use: No  . Drug Use: No  . Sexual Activity: Not on file   Other Topics Concern  . Not on file   Social History Narrative   Lives by herself.    Review of Systems: Constitutional: Negative for fever, chills and weight loss.  Eyes: Negative for blurred vision.    Respiratory: Negative for cough and shortness of breath.  Cardiovascular: Negative for chest pain, palpitations and leg swelling.  Gastrointestinal: Negative for nausea, vomiting, abdominal pain, diarrhea, constipation and blood in stool.  Genitourinary: Negative for dysuria, urgency and frequency.  Musculoskeletal: Negative for myalgias and back pain.  Neurological: Negative for dizziness, weakness and headaches.     Objective:   Vital Signs: There were no vitals filed for this visit.    BP Readings from Last 3 Encounters:  12/22/14 120/80  10/15/13 120/80  04/22/13 122/74    Physical Exam: Constitutional: Vital signs reviewed.  Patient is in NAD and cooperative with exam.  Head: Normocephalic and atraumatic. Eyes: EOMI, conjunctivae nl, no scleral icterus.  Neck: Supple. Cardiovascular: RRR, no MRG. Pulmonary/Chest: normal effort, CTAB, no wheezes, rales, or rhonchi. Abdominal: Soft. NT/ND +BS. Neurological: A&O x3, cranial nerves II-XII are grossly intact, moving all extremities. Extremities: 2+DP b/l; no pitting edema. Skin: Warm, dry and intact. No rash.   Assessment & Plan:   Assessment and plan was discussed and formulated with my attending.

## 2015-12-30 NOTE — Patient Instructions (Signed)
Thank you for your visit today.   Please return to the internal medicine clinic in 6 month(s) or sooner if needed.     I have made the following additions/changes to your medications:  Add a calcium/Vit D supplement   You need the following test(s) for regular health maintenance:  We will give you stool cards.    Please be sure to bring all of your medications with you to every visit; this includes herbal supplements, vitamins, eye drops, and any over-the-counter medications.   Should you have any questions regarding your medications and/or any new or worsening symptoms, please be sure to call the clinic at [phone].   If you believe that you are suffering from a life threatening condition or one that may result in the loss of limb or function, then you should call 911 and proceed to the nearest Emergency Department.   A healthy lifestyle and preventative care can promote health and wellness.   Maintain regular health, dental, and eye exams.  Eat a healthy diet. Foods like vegetables, fruits, whole grains, low-fat dairy products, and lean protein foods contain the nutrients you need without too many calories. Decrease your intake of foods high in solid fats, added sugars, and salt. Get information about a proper diet from your caregiver, if necessary.  Regular physical exercise is one of the most important things you can do for your health. Most adults should get at least 150 minutes of moderate-intensity exercise (any activity that increases your heart rate and causes you to sweat) each week. In addition, most adults need muscle-strengthening exercises on 2 or more days a week.   Maintain a healthy weight. The body mass index (BMI) is a screening tool to identify possible weight problems. It provides an estimate of body fat based on height and weight. Your caregiver can help determine your BMI, and can help you achieve or maintain a healthy weight. For adults 20 years and older:  A  BMI below 18.5 is considered underweight.  A BMI of 18.5 to 24.9 is normal.  A BMI of 25 to 29.9 is considered overweight.  A BMI of 30 and above is considered obese.

## 2015-12-30 NOTE — Assessment & Plan Note (Signed)
-  check lipid panel  

## 2015-12-30 NOTE — Assessment & Plan Note (Signed)
BP 124/58. -cont HCTZ

## 2015-12-30 NOTE — Assessment & Plan Note (Signed)
advised calcium vit D

## 2015-12-30 NOTE — Assessment & Plan Note (Signed)
-  stool cards mailed -mammogram ordered

## 2015-12-30 NOTE — Assessment & Plan Note (Signed)
-  check HA1c  

## 2015-12-30 NOTE — Assessment & Plan Note (Signed)
She has actually lost ~20lbs since her last OV.  I congratulated her on this! -cont weight loss/exercise

## 2015-12-31 ENCOUNTER — Other Ambulatory Visit: Payer: Self-pay | Admitting: Internal Medicine

## 2015-12-31 DIAGNOSIS — Z1231 Encounter for screening mammogram for malignant neoplasm of breast: Secondary | ICD-10-CM

## 2015-12-31 LAB — LIPID PANEL
CHOLESTEROL TOTAL: 206 mg/dL — AB (ref 100–199)
Chol/HDL Ratio: 3.2 ratio units (ref 0.0–4.4)
HDL: 64 mg/dL (ref >39)
LDL CALC: 123 mg/dL — AB (ref 0–99)
Triglycerides: 97 mg/dL (ref 0–149)
VLDL CHOLESTEROL CAL: 19 mg/dL (ref 5–40)

## 2015-12-31 LAB — CBC
Hematocrit: 38.6 % (ref 34.0–46.6)
Hemoglobin: 12.4 g/dL (ref 11.1–15.9)
MCH: 26.9 pg (ref 26.6–33.0)
MCHC: 32.1 g/dL (ref 31.5–35.7)
MCV: 84 fL (ref 79–97)
PLATELETS: 352 10*3/uL (ref 150–379)
RBC: 4.61 x10E6/uL (ref 3.77–5.28)
RDW: 14.1 % (ref 12.3–15.4)
WBC: 6.5 10*3/uL (ref 3.4–10.8)

## 2015-12-31 LAB — BMP8+ANION GAP
Anion Gap: 21 mmol/L — ABNORMAL HIGH (ref 10.0–18.0)
BUN / CREAT RATIO: 21 (ref 12–28)
BUN: 15 mg/dL (ref 8–27)
CALCIUM: 9.9 mg/dL (ref 8.7–10.3)
CO2: 23 mmol/L (ref 18–29)
CREATININE: 0.73 mg/dL (ref 0.57–1.00)
Chloride: 99 mmol/L (ref 96–106)
GFR calc Af Amer: 101 mL/min/{1.73_m2} (ref >59)
GFR calc non Af Amer: 88 mL/min/{1.73_m2} (ref >59)
Glucose: 91 mg/dL (ref 65–99)
Potassium: 4.4 mmol/L (ref 3.5–5.2)
Sodium: 143 mmol/L (ref 134–144)

## 2016-01-01 NOTE — Progress Notes (Signed)
Internal Medicine Clinic Attending  Case discussed with Dr. Gill at the time of the visit.  We reviewed the resident's history and exam and pertinent patient test results.  I agree with the assessment, diagnosis, and plan of care documented in the resident's note.  

## 2016-01-07 ENCOUNTER — Other Ambulatory Visit: Payer: Self-pay | Admitting: Internal Medicine

## 2016-01-07 NOTE — Telephone Encounter (Signed)
   Reason for call:   I received a call from Ms. Darlene [name] I [name] at 7:17PM PM indicating she needs refill of her HCTZ.Marland Kitchen.   Pertinent Data:   She has run out of her prescription of HCTZ.    Assessment / Plan / Recommendations:   [facility] [address]  As always, pt is advised that if symptoms worsen or new symptoms arise, they should go to an urgent care facility or to to ER for further evaluation.   Darlene PaulaJennifer T Jaliyah Fotheringham, MD   01/07/2016, 7:17 PM

## 2016-01-21 ENCOUNTER — Other Ambulatory Visit: Payer: Self-pay

## 2016-01-21 MED ORDER — HYDROCHLOROTHIAZIDE 12.5 MG PO TABS: 12.5000 mg | ORAL_TABLET | Freq: Every day | ORAL | Status: DC

## 2016-01-21 NOTE — Telephone Encounter (Signed)
Pt requesting hydrochlorothiazide to be filled @ [facility].

## 2016-03-07 ENCOUNTER — Encounter: Payer: Self-pay | Admitting: *Deleted

## 2016-09-09 ENCOUNTER — Other Ambulatory Visit: Payer: Self-pay | Admitting: *Deleted

## 2016-09-09 NOTE — Telephone Encounter (Signed)
appt 10/28/16 with pcp

## 2016-09-10 MED ORDER — HYDROCHLOROTHIAZIDE 12.5 MG PO TABS: 12.5000 mg | ORAL_TABLET | Freq: Every day | ORAL | 6 refills | Status: DC

## 2016-10-28 ENCOUNTER — Encounter: Payer: Self-pay | Admitting: Internal Medicine

## 2016-10-28 ENCOUNTER — Ambulatory Visit (INDEPENDENT_AMBULATORY_CARE_PROVIDER_SITE_OTHER): Payer: BLUE CROSS/BLUE SHIELD | Admitting: Internal Medicine

## 2016-10-28 VITALS — BP 132/80 | HR 85 | Temp 98.4°F | Wt 209.9 lb

## 2016-10-28 DIAGNOSIS — M170 Bilateral primary osteoarthritis of knee: Secondary | ICD-10-CM

## 2016-10-28 DIAGNOSIS — R7303 Prediabetes: Secondary | ICD-10-CM

## 2016-10-28 DIAGNOSIS — Z79899 Other long term (current) drug therapy: Secondary | ICD-10-CM

## 2016-10-28 DIAGNOSIS — E785 Hyperlipidemia, unspecified: Secondary | ICD-10-CM

## 2016-10-28 DIAGNOSIS — Z0000 Encounter for general adult medical examination without abnormal findings: Secondary | ICD-10-CM

## 2016-10-28 DIAGNOSIS — I10 Essential (primary) hypertension: Secondary | ICD-10-CM | POA: Diagnosis not present

## 2016-10-28 LAB — POCT GLYCOSYLATED HEMOGLOBIN (HGB A1C): Hemoglobin A1C: 5.5

## 2016-10-28 LAB — GLUCOSE, CAPILLARY: GLUCOSE-CAPILLARY: 85 mg/dL (ref 65–99)

## 2016-10-28 MED ORDER — HYDROCHLOROTHIAZIDE 12.5 MG PO TABS: 12.5000 mg | ORAL_TABLET | Freq: Every day | ORAL | 3 refills | Status: DC

## 2016-10-28 MED ORDER — DICLOFENAC SODIUM 1 % TD GEL: 2.0000 g | Freq: Four times a day (QID) | TRANSDERMAL | 1 refills | Status: DC | PRN

## 2016-10-28 NOTE — Assessment & Plan Note (Addendum)
She remained very concerned about her diabetes returning. Rechecked A1c and it was 5.5 today.  Encouraged weight loss

## 2016-10-28 NOTE — Assessment & Plan Note (Signed)
BP 132/80 today and has been taking her HCTZ, well controlled, at goal.  Plan: Continue HCTZ 12.5 mg daily, provided Rx for 90-day supply as requested

## 2016-10-28 NOTE — Progress Notes (Signed)
   CC: Bilateral knee pain  HPI:  Darlene [name] is a 66 y.o. female with PMHx detailed below presenting for healthcare maintenance visit with complaints of persistent arthritis pain in her knees.  See problem based assessment and plan below for additional details.  Past Medical History:  Diagnosis Date  . Elevated LFTs    history of, normal abdominal ultrasound, LFT's normal 01/2007  . History of chest pain    rulre out MI in 12/2006.  Marland Kitchen. Hyperlipidemia   . Hypertension   . Obesity   . Osteoarthritis of both knees 12/26/2007   Xray of knee 10/2007: Bilateral osteoarthritis, sent to PT but did not complete tx course.    . Osteopenia 07/05/2015  . Prediabetes 11/30/2015    Review of Systems: Review of Systems  Constitutional: Negative for chills, fever, malaise/fatigue and weight loss.  Respiratory: Negative for cough, sputum production and shortness of breath.   Cardiovascular: Negative for chest pain, palpitations and leg swelling.  Gastrointestinal: Negative for abdominal pain, constipation, diarrhea, nausea and vomiting.  Musculoskeletal: Positive for joint pain. Negative for back pain and neck pain.  Skin: Negative for rash.  All other systems reviewed and are negative.    Physical Exam: Vitals:   10/28/16 1418  BP: 132/80  Pulse: 85  Temp: 98.4 F (36.9 C)  TempSrc: Oral  SpO2: 100%  Weight: 209 lb 14.4 oz (95.2 kg)   Body mass index is 40.99 kg/m. GENERAL- Well-dressed elderly obese woman sitting comfortably in exam room chair, alert, in no distress HEENT- Atraumatic, PERRL, moist mucous membranes, adequate dentition CARDIAC- Regular rate and rhythm, no murmurs, rubs or gallops. RESP- Clear to ascultation bilaterally, normal work of breathing ABDOMEN- Obese, soft, nontender, nondistended BACK- Lordotic curvature, no spinal or paraspinal tenderness EXTREMITIES- Obese bulk and normal range of motion, trace ankle edema, 1+ peripheral pulses, left knee tender to  palpation along joint line SKIN- Warm, dry, intact, without visible rash PSYCH- Positive affect, clear speech, thoughts linear and goal-directed  Assessment & Plan:   See encounters tab for problem based medical decision making.  Patient discussed with Dr. Cleda DaubE. Hoffman

## 2016-10-28 NOTE — Assessment & Plan Note (Addendum)
Has tricompartmental knee OA bilaterally seen on XR in 2009. Endorses significant pain with ambulation L>R, especially with standing from sitting position. This impairs her walking and makes her feel unstable. No falls. Some relief with Ibuprofen, none with Tylenol. Does not like taking so many Ibuprofen or taking pills. Has tried PT in the past but not interested, feels she can do exercises/stretches on her own. Pain had improved previously with weight loss. Also had success previously with water aerobics. May benefit from injections in future or referral to ortho for knee replacement.  Plan: - Trial Rx Diclofenac gel 1%, if still uncontrolled try injections or refer to ortho - Can continue PRN Ibuprofen/Tylenol - Encouraged weight loss, diet/exercise - Filled out 6 month handicap placard

## 2016-10-28 NOTE — Assessment & Plan Note (Addendum)
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 POC hemoccult cards provided for colon cancer screening. Checking BMP, Hb A1c, lipid panel, HIV, HCV, pap smear, wet prep today and ordered screening mammogram (never had this done last year).  Plan: - Follow the above lab results, will call her with results and also provide letter of TB test results for her work

## 2016-10-28 NOTE — Assessment & Plan Note (Addendum)
Checking Lipid panel. ASCVD 10 year risk score 8.5% based on results from last April. She is hesitant to start statin medications but would be amenable to starting one if her cholesterol is again found to be elevated.  Plan: - Follow lipid panel, may Rx low dose statin

## 2016-10-28 NOTE — Patient Instructions (Addendum)
Please continue to take your medications as prescribed.  We have prescribed Diclofenac gel which you can apply to your knees up to 4 times daily as needed for arthritis pain. You can continue to use Ibuprofen and Tylenol as needed for this pain as well.   Continue to work on diet/exercise and weight loss, as this will improve the pressure your knees experience on a daily basis.  We will call with your lab results.   Please return your stool cards to our lab in the near future.  You should receive a call to schedule your mammogram.  You are clear to return to clinic for another check up in 6 months.

## 2016-10-29 LAB — LIPID PANEL
CHOLESTEROL TOTAL: 214 mg/dL — AB (ref 100–199)
Chol/HDL Ratio: 3 ratio units (ref 0.0–4.4)
HDL: 72 mg/dL (ref >39)
LDL CALC: 122 mg/dL — AB (ref 0–99)
TRIGLYCERIDES: 99 mg/dL (ref 0–149)
VLDL Cholesterol Cal: 20 mg/dL (ref 5–40)

## 2016-10-29 LAB — BMP8+ANION GAP
ANION GAP: 19 mmol/L — AB (ref 10.0–18.0)
BUN/Creatinine Ratio: 18 (ref 12–28)
BUN: 14 mg/dL (ref 8–27)
CALCIUM: 9.6 mg/dL (ref 8.7–10.3)
CO2: 24 mmol/L (ref 18–29)
CREATININE: 0.79 mg/dL (ref 0.57–1.00)
Chloride: 99 mmol/L (ref 96–106)
GFR calc Af Amer: 92 mL/min/{1.73_m2} (ref >59)
GFR calc non Af Amer: 80 mL/min/{1.73_m2} (ref >59)
Glucose: 85 mg/dL (ref 65–99)
Potassium: 4.1 mmol/L (ref 3.5–5.2)
Sodium: 142 mmol/L (ref 134–144)

## 2016-10-29 LAB — HEPATITIS C ANTIBODY: HEP C VIRUS AB: 0.1 {s_co_ratio} (ref 0.0–0.9)

## 2016-10-29 LAB — HIV ANTIBODY (ROUTINE TESTING W REFLEX): HIV SCREEN 4TH GENERATION: NONREACTIVE

## 2016-10-31 LAB — CERVICOVAGINAL ANCILLARY ONLY: WET PREP (BD AFFIRM): POSITIVE — AB

## 2016-11-01 NOTE — Progress Notes (Signed)
Internal Medicine Clinic Attending  Case discussed with Dr. Laural BenesJohnson at the time of the visit.  We reviewed the resident's history and exam and pertinent patient test results.  I agree with the assessment, diagnosis, and plan of care documented in the resident's note. Wet prep positive for BV will need course of flagyl.

## 2016-11-02 ENCOUNTER — Other Ambulatory Visit: Payer: Self-pay | Admitting: Internal Medicine

## 2016-11-02 DIAGNOSIS — Z1231 Encounter for screening mammogram for malignant neoplasm of breast: Secondary | ICD-10-CM

## 2016-11-02 LAB — QUANTIFERON IN TUBE
QFT TB AG MINUS NIL VALUE: 0.01 [IU]/mL
QUANTIFERON MITOGEN VALUE: 5.54 [IU]/mL
QUANTIFERON NIL VALUE: 0.04 [IU]/mL
QUANTIFERON TB AG VALUE: 0.05 [IU]/mL
QUANTIFERON TB GOLD: NEGATIVE

## 2016-11-02 LAB — CYTOLOGY - PAP: Diagnosis: NEGATIVE

## 2016-11-02 LAB — QUANTIFERON TB GOLD ASSAY (BLOOD)

## 2016-11-03 ENCOUNTER — Telehealth: Payer: Self-pay | Admitting: *Deleted

## 2016-11-03 ENCOUNTER — Telehealth: Payer: Self-pay

## 2016-11-03 NOTE — Telephone Encounter (Signed)
Pt calls angry, states dr Laural Benesjohnson was to call her with her results no matter if they were normal or abnormal and he has not done so, she finds him quite rude and this was not respectful Discussed TB, tracyf. Says 2 copies are in dr W[employer]son's mailbox, pt would like dr Laural Benesjohnson to call her and mail her 2 copies of TB results for her job, she states mail them to her home. Could you please call her this pm Thank you

## 2016-11-03 NOTE — Telephone Encounter (Signed)
Pt needs to speak with a nurse regarding personal problem. Please call back.

## 2016-11-03 NOTE — Telephone Encounter (Signed)
[facility] for [place] for Diclofenac Gel 1%.  Approved notification was sent 11/03/2016 thru 09/25/2018.  Angelina OkGladys Rocky Rishel, RN 11/03/2016 10:20 AM

## 2016-11-04 ENCOUNTER — Telehealth: Payer: Self-pay | Admitting: Internal Medicine

## 2016-11-04 ENCOUNTER — Encounter: Payer: Self-pay | Admitting: Internal Medicine

## 2016-11-04 MED ORDER — ROSUVASTATIN CALCIUM 5 MG PO TABS: 5.0000 mg | ORAL_TABLET | Freq: Every day | ORAL | 1 refills | Status: DC

## 2016-11-04 NOTE — Telephone Encounter (Signed)
I have now attempted to call pt to let her know that dr Laural Benesjohnson will try to call today and had tried to call this week, there was no answer and vmail has not been set up, so I could not leave message

## 2016-11-04 NOTE — Telephone Encounter (Signed)
Called Darlene [name] this morning to relay her recent lab test results. Discussed that her cholesterol levels remain elevated and that starting a statin medication would be prudent. Discussed that it is typically quite difficult to meaningfully impact with diet/exercise/weight loss alone. Agreeable to trial low dose medication - prescribed Rosuvastatin 5 mg, discussed possible side effects. Relayed the positive Gardnerella wet prep results, which she found disconcerting. Discussed this with her and she reports no symptoms. There is no indication for treating asymptomatic BV. She stated that should would do some research into this infection and may call to request an antibiotic regardless.

## 2016-12-05 ENCOUNTER — Ambulatory Visit: Payer: BLUE CROSS/BLUE SHIELD

## 2016-12-22 ENCOUNTER — Ambulatory Visit: Payer: BLUE CROSS/BLUE SHIELD

## 2017-01-12 ENCOUNTER — Ambulatory Visit: Payer: BLUE CROSS/BLUE SHIELD

## 2017-01-30 ENCOUNTER — Ambulatory Visit (INDEPENDENT_AMBULATORY_CARE_PROVIDER_SITE_OTHER): Payer: Self-pay | Admitting: Internal Medicine

## 2017-01-30 ENCOUNTER — Encounter (INDEPENDENT_AMBULATORY_CARE_PROVIDER_SITE_OTHER): Payer: Self-pay

## 2017-01-30 ENCOUNTER — Encounter: Payer: Self-pay | Admitting: Internal Medicine

## 2017-01-30 VITALS — BP 137/91 | HR 104 | Temp 98.1°F | Ht 60.0 in | Wt 215.3 lb

## 2017-01-30 DIAGNOSIS — H8111 Benign paroxysmal vertigo, right ear: Secondary | ICD-10-CM | POA: Insufficient documentation

## 2017-01-30 DIAGNOSIS — R42 Dizziness and giddiness: Secondary | ICD-10-CM

## 2017-01-30 DIAGNOSIS — R110 Nausea: Secondary | ICD-10-CM

## 2017-01-30 NOTE — Assessment & Plan Note (Signed)
The patient presents with a 72 hour history of dizziness and nausea. Patient states that she gets dizzy when moving her head and with standing from a seated position. Orthostatic vitals negative. Neurological examination grossly intact. Dix-Hallpike maneuver causes dizziness. Epley was performed with improvement in dizziness and nausea. It seems most likely diagnosis given the physical examination and history would be benign paroxysmal positional vertigo. However, the patient also endorses a recent sinus infection and her dizziness may also be secondary to acute labyrinthitis. For the treatment of acute labyrinthitis would require a steroid taper over 10 days. Given that the patient had significant improvement following Epley maneuver we will not start steroids at this time. Instructed the patient to call clinic if she is not better or her symptoms return in the next 2 days and we will provide steroids for her. Overall, I think her presentation is most likely secondary to benign paroxysmal positional vertigo although currently acute labyrinthitis secondary to viral infection remains in the differential. -- Improved with Epley

## 2017-01-30 NOTE — Patient Instructions (Signed)
It was a pleasure seeing you today. Thank you for choosing [facility] for your healthcare needs.   I think the most likely cause of your symptoms is BPPV. The maneuvers we completed in clinic today should help the problem go away. If it does not go away or returns please call the clinic and we will start steroids as we discussed in clinic. For moe on BPPV please see the information Benign Positional Vertigo Vertigo is the feeling that you or your surroundings are moving when they are not. Benign positional vertigo is the most common form of vertigo. The cause of this condition is not serious (is benign). This condition is triggered by certain movements and positions (is positional). This condition can be dangerous if it occurs while you are doing something that could endanger you or others, such as driving. What are the causes? In many cases, the cause of this condition is not known. It may be caused by a disturbance in an area of the inner ear that helps your brain to sense movement and balance. This disturbance can be caused by a viral infection (labyrinthitis), head injury, or repetitive motion. What increases the risk? This condition is more likely to develop in:  Women.  People who are 67 years of age or older. What are the signs or symptoms? Symptoms of this condition usually happen when you move your head or your eyes in different directions. Symptoms may start suddenly, and they usually last for less than a minute. Symptoms may include:  Loss of balance and falling.  Feeling like you are spinning or moving.  Feeling like your surroundings are spinning or moving.  Nausea and vomiting.  Blurred vision.  Dizziness.  Involuntary eye movement (nystagmus). Symptoms can be mild and cause only slight annoyance, or they can be severe and interfere with daily life. Episodes of benign positional vertigo may return (recur) over time, and they may be triggered by certain movements. Symptoms  may improve over time. How is this diagnosed? This condition is usually diagnosed by medical history and a physical exam of the head, neck, and ears. You may be referred to a health care provider who specializes in ear, nose, and throat (ENT) problems (otolaryngologist) or a provider who specializes in disorders of the nervous system (neurologist). You may have additional testing, including:  MRI.  A CT scan.  Eye movement tests. Your health care provider may ask you to change positions quickly while he or she watches you for symptoms of benign positional vertigo, such as nystagmus. Eye movement may be tested with an electronystagmogram (ENG), caloric stimulation, the Dix-Hallpike test, or the roll test.  An electroencephalogram (EEG). This records electrical activity in your brain.  Hearing tests. How is this treated? Usually, your health care provider will treat this by moving your head in specific positions to adjust your inner ear back to normal. Surgery may be needed in severe cases, but this is rare. In some cases, benign positional vertigo may resolve on its own in 2-4 weeks. Follow these instructions at home: Safety   Move slowly.Avoid sudden body or head movements.  Avoid driving.  Avoid operating heavy machinery.  Avoid doing any tasks that would be dangerous to you or others if a vertigo episode would occur.  If you have trouble walking or keeping your balance, try using a cane for stability. If you feel dizzy or unstable, sit down right away.  Return to your normal activities as told by your health care provider.  Ask your health care provider what activities are safe for you. General instructions   Take over-the-counter and prescription medicines only as told by your health care provider.  Avoid certain positions or movements as told by your health care provider.  Drink enough fluid to keep your urine clear or pale yellow.  Keep all follow-up visits as told by your  health care provider. This is important. Contact a health care provider if:  You have a fever.  Your condition gets worse or you develop new symptoms.  Your family or friends notice any behavioral changes.  Your nausea or vomiting gets worse.  You have numbness or a "pins and needles" sensation. Get help right away if:  You have difficulty speaking or moving.  You are always dizzy.  You faint.  You develop severe headaches.  You have weakness in your legs or arms.  You have changes in your hearing or vision.  You develop a stiff neck.  You develop sensitivity to light. This information is not intended to replace advice given to you by your health care provider. Make sure you discuss any questions you have with your health care provider. Document Released: 06/20/2006 Document Revised: 02/18/2016 Document Reviewed: 01/05/2015 Elsevier Interactive Patient Education  2017 [employer]  below.

## 2017-01-30 NOTE — Progress Notes (Signed)
   CC: Dizziness and left ear pain HPI: Ms. Darlene [name] is a 66 y.o. female with a h/o of hyperlipidemia, hypertension, obesity and osteoarthritis of both knees who presents with dizziness and left ear pain since Friday.   Review of Systems: Endorses nausea but no episodes of vomiting. Denies chest pain or shortness of breath. Denies polyuria.  Physical Exam: Vitals:   01/30/17 1602  Temp: 98.1 F (36.7 C)  TempSrc: Oral  SpO2: 97%  Weight: 215 lb 4.8 oz (97.7 kg)  Height: 5' (1.524 m)   Head: Normocephalic, without obvious abnormality, atraumatic Eyes: conjunctivae/corneas clear. PERRL, EOM's intact. Fundi benign. Lungs: clear to auscultation bilaterally Heart: regular rate and rhythm, S1, S2 normal, no murmur, click, rub or gallop Abdomen: soft, non-tender; bowel sounds normal; no masses,  no organomegaly Extremities: extremities normal, atraumatic, no cyanosis or edema Neurologic: Grossly normal  Assessment & Plan:  See encounters tab for problem based medical decision making. Patient seen with Dr. Cleda DaubE. Hoffman  Signed: Thomasene Lotaylor, Bryann Mcnealy, MD 01/30/2017, 4:14 PM  Pager: [phone]

## 2017-02-01 ENCOUNTER — Ambulatory Visit: Payer: BLUE CROSS/BLUE SHIELD

## 2017-02-01 NOTE — Progress Notes (Signed)
Internal Medicine Clinic Attending  I saw and evaluated the patient.  I personally confirmed the key portions of the history and exam documented by Dr. Ladona Ridgelaylor and I reviewed pertinent patient test results.  The assessment, diagnosis, and plan were formulated together and I agree with the documentation in the resident's note. Dix hallpike preformed, positive with turn to right, eply preformed with resolution of symptoms. Provided instructions for self eply at home.

## 2017-03-06 ENCOUNTER — Encounter: Payer: Self-pay | Admitting: *Deleted

## 2017-03-06 ENCOUNTER — Ambulatory Visit: Payer: BLUE CROSS/BLUE SHIELD

## 2017-03-07 ENCOUNTER — Ambulatory Visit: Payer: BLUE CROSS/BLUE SHIELD

## 2017-03-10 ENCOUNTER — Ambulatory Visit (INDEPENDENT_AMBULATORY_CARE_PROVIDER_SITE_OTHER): Payer: Self-pay | Admitting: Internal Medicine

## 2017-03-10 DIAGNOSIS — M170 Bilateral primary osteoarthritis of knee: Secondary | ICD-10-CM

## 2017-03-10 MED ORDER — DICLOFENAC SODIUM 1 % TD GEL: 2.0000 g | Freq: Four times a day (QID) | TRANSDERMAL | 2 refills | Status: DC | PRN

## 2017-03-10 NOTE — Patient Instructions (Signed)
Ms. Darlene DrainSides it was nice meeting you today.   For the pain in both of your knees:  -Continue taking over-the-counter Tylenol as needed  -Use Voltaren gel 4 times a day  -I encourage you to go for water aerobics. Weight loss will help with your chronic knee pain.

## 2017-03-10 NOTE — Assessment & Plan Note (Addendum)
History of present illness Patient has a history of tricompartmental knee osteoarthritis seen on x-ray done in February 2009. During her previous visit with her PCP in February 2018, she was prescribed Voltaren gel, advised to use ibuprofen/Tylenol as needed, and advised to lose weight. At present, patient continues to complain of bilateral knee pain. States she is taking Tylenol 650 mg twice daily and it is not helping. She is using Voltaren gel twice a day. Patient had last attended physical therapy in June 2016 and states it did not help her. She is not interested in going to physical therapy in the future. Does report engaging in water aerobics exercises.  Assessment Severe bilateral knee osteoarthritis. Patient has gained 5 pounds in the past 1 month. She is declining PT. She does not want to take NSAIDs as she is concerned about side effects. I discussed increasing the dose of Tylenol to 1000 mg 3 times a day (maximum 3000 mg per day) but patient declined stating she is concerned about side effects. She wants to continue her current dose of Tylenol. Discussed starting her on duloxetine but patient declined due to concern for side effects. Discussed intra-articular corticosteroid injections and patient declined.  Plan -Advised her to use Voltaren gel 4 times a day consistently -Continue her current dose of Tylenol 650 mg twice daily -Emphasized the importance of weight loss. Encouraged her to continue going for water aerobics. -Follow-up in 4 weeks

## 2017-03-10 NOTE — Progress Notes (Signed)
   CC: Patient is here to discuss her bilateral knee osteoarthritis.  HPI:  [name] is a 66 y.o. female with a past medical history of conditions listed below presenting to the clinic to discuss her bilateral knee osteoarthritis. Please see problem based charting for the status of the patient's current and chronic medical conditions.   Past Medical History:  Diagnosis Date  . Elevated LFTs    history of, normal abdominal ultrasound, LFT's normal 01/2007  . History of chest pain    rulre out MI in 12/2006.  Marland Kitchen. Hyperlipidemia   . Hypertension   . Obesity   . Osteoarthritis of both knees 12/26/2007   Xray of knee 10/2007: Bilateral osteoarthritis, sent to PT but did not complete tx course.    . Osteopenia 07/05/2015  . Prediabetes 11/30/2015    Review of Systems: Pertinent positives mentioned in HPI. Remainder of all ROS negative.   Physical Exam:  Vitals:   03/10/17 1510  BP: 128/63  Pulse: 88  Temp: 98.2 F (36.8 C)  TempSrc: Oral  SpO2: 99%  Weight: 220 lb 4.8 oz (99.9 kg)  Height: 5' (1.524 m)   Physical Exam  Constitutional: She is oriented to person, place, and time. She appears well-developed and well-nourished. No distress.  HENT:  Head: Normocephalic and atraumatic.  Eyes: Right eye exhibits no discharge. Left eye exhibits no discharge.  Cardiovascular: Normal rate, regular rhythm and intact distal pulses.   Pulmonary/Chest: Effort normal and breath sounds normal. No respiratory distress. She has no wheezes. She has no rales.  Abdominal: Soft. Bowel sounds are normal. She exhibits no distension. There is no tenderness.  Musculoskeletal: She exhibits no edema.  Knees bilaterally: No erythema or increased warmth noted. No joint effusion appreciated. Mildly reduced range of motion with crepitus.  Neurological: She is alert and oriented to person, place, and time.  Skin: Skin is warm and dry.    Assessment & Plan:   See Encounters Tab for problem based  charting.  Patient discussed with Dr. Rogelia BogaButcher

## 2017-03-13 NOTE — Progress Notes (Signed)
Internal Medicine Clinic Attending  Case discussed with Dr. Rathoreat the time of the visit. We reviewed the resident's history and exam and pertinent patient test results. I agree with the assessment, diagnosis, and plan of care documented in the resident's note.  

## 2017-03-19 LAB — GLUCOSE, POCT (MANUAL RESULT ENTRY): POC GLUCOSE: 105 mg/dL — AB (ref 70–99)

## 2017-05-08 ENCOUNTER — Encounter: Payer: Self-pay | Admitting: Internal Medicine

## 2017-05-08 ENCOUNTER — Ambulatory Visit (INDEPENDENT_AMBULATORY_CARE_PROVIDER_SITE_OTHER): Payer: Self-pay | Admitting: Internal Medicine

## 2017-05-08 VITALS — BP 144/71 | HR 75 | Temp 98.0°F | Ht 60.0 in | Wt 222.7 lb

## 2017-05-08 DIAGNOSIS — B9689 Other specified bacterial agents as the cause of diseases classified elsewhere: Secondary | ICD-10-CM

## 2017-05-08 DIAGNOSIS — E785 Hyperlipidemia, unspecified: Secondary | ICD-10-CM

## 2017-05-08 DIAGNOSIS — Z0000 Encounter for general adult medical examination without abnormal findings: Secondary | ICD-10-CM

## 2017-05-08 DIAGNOSIS — R7303 Prediabetes: Secondary | ICD-10-CM

## 2017-05-08 DIAGNOSIS — N760 Acute vaginitis: Secondary | ICD-10-CM

## 2017-05-08 DIAGNOSIS — Z79899 Other long term (current) drug therapy: Secondary | ICD-10-CM

## 2017-05-08 DIAGNOSIS — M170 Bilateral primary osteoarthritis of knee: Secondary | ICD-10-CM

## 2017-05-08 DIAGNOSIS — I10 Essential (primary) hypertension: Secondary | ICD-10-CM

## 2017-05-08 LAB — GLUCOSE, CAPILLARY: Glucose-Capillary: 81 mg/dL (ref 65–99)

## 2017-05-08 LAB — POCT GLYCOSYLATED HEMOGLOBIN (HGB A1C): Hemoglobin A1C: 5.8

## 2017-05-08 MED ORDER — DULOXETINE HCL 30 MG PO CPEP: 30.0000 mg | ORAL_CAPSULE | Freq: Every day | ORAL | 3 refills | Status: DC

## 2017-05-08 MED ORDER — ROSUVASTATIN CALCIUM 5 MG PO TABS: 5.0000 mg | ORAL_TABLET | Freq: Every day | ORAL | 2 refills | Status: DC

## 2017-05-08 MED ORDER — METRONIDAZOLE 500 MG PO TABS: 500.0000 mg | ORAL_TABLET | Freq: Two times a day (BID) | ORAL | 0 refills | Status: AC

## 2017-05-08 MED ORDER — OMEGA-3-ACID ETHYL ESTERS 1 G PO CAPS
1.0000 g | ORAL_CAPSULE | Freq: Every day | ORAL | 3 refills | Status: DC
Start: 2017-05-08 — End: 2018-02-26

## 2017-05-08 MED ORDER — LIDOCAINE 5 % EX OINT: 1.0000 "application " | TOPICAL_OINTMENT | Freq: Every day | CUTANEOUS | 3 refills | Status: DC | PRN

## 2017-05-08 NOTE — Assessment & Plan Note (Signed)
Screening mammogram ordered. Patient to calls scholarship program to set this up as she is self-pay.

## 2017-05-08 NOTE — Assessment & Plan Note (Signed)
POC A1c 5.8 today from 5.5 10/2016.   - No interventions at this time  - Encouraged patient to continue exercising  - Repeat A1c in 3 months. Consider starting metformin if >5.8 on repeat

## 2017-05-08 NOTE — Assessment & Plan Note (Signed)
Refilled rosuvastatin 5mg 

## 2017-05-08 NOTE — Assessment & Plan Note (Signed)
BP 144/71 today and 143/72 when rechecked. Denies HA, blurry vision, chest pain, SOB, and LE swelling. Patient currently on HCTZ 12.5mg  daily. Suggested increasing dose to 25mg  daily, but patient declined.   - Patient educated and aware of possible consequences of uncontrolled HTN  - F/u in 2 months for HTN f/u

## 2017-05-08 NOTE — Assessment & Plan Note (Signed)
Patient complaining of fishy odor. Denies discharge and urinary symptoms. Not sexually active currently.  States she was diagnosed with BV in 10/2016, but not treated for it. I do not see a prescription for Flagyl in her chart.   - Flagyl 500mg  BID x7 days

## 2017-05-08 NOTE — Patient Instructions (Addendum)
Please start using lidocaine cream on your knees once daily or more if needed. Please start taking Cymbalta 30mg  once daily (1 tablet daily).   Please start taking rosuvastatin 5mg  once a day and omega-3 1g once a day.   Please start taking metronidazole (Flagyl) 500mg  2 times a day for a total of 7 days (1 tablet in the morning and 1 tablet at night).   [facility] [place]   Please call [phone] to get in touch with scholarship program for your mammogram.   We checked your hemoglobin A1c today. I will call you if results of your test are abnormal. Please follow up with me (Dr. Evelene CroonSantos) in 2 months.

## 2017-05-08 NOTE — Progress Notes (Signed)
   CC: OA and HTN follow up  HPI:  Darlene [name] is a 66 y.o. female with PMH as described below who presents to clinic for OA and HTN follow up. Please see problem-based assessment and plan for further details.   Past Medical History:  Diagnosis Date  . Elevated LFTs    history of, normal abdominal ultrasound, LFT's normal 01/2007  . History of chest pain    rulre out MI in 12/2006.  Marland Kitchen. Hyperlipidemia   . Hypertension   . Obesity   . Osteoarthritis of both knees 12/26/2007   Xray of knee 10/2007: Bilateral osteoarthritis, sent to PT but did not complete tx course.    . Osteopenia 07/05/2015  . Prediabetes 11/30/2015   Review of Systems:   Review of Systems  Constitutional: Negative for chills and fever.  Eyes: Negative for blurred vision and double vision.  Respiratory: Negative for cough and shortness of breath.   Cardiovascular: Negative for chest pain, palpitations and leg swelling.  Gastrointestinal: Negative for abdominal pain, constipation, diarrhea, nausea and vomiting.  Genitourinary: Negative for dysuria, frequency and urgency.  Musculoskeletal: Positive for joint pain. Negative for back pain and myalgias.    Physical Exam:  Vitals:   05/08/17 1427  BP: (!) 144/71  Pulse: 75  Temp: 98 F (36.7 C)  TempSrc: Oral  SpO2: 100%  Weight: 222 lb 11.2 oz (101 kg)  Height: 5' (1.524 m)   General: overweight, well-developed female, in no acute distress HENT: NCAT, neck supple and FROM, MMM, OP clear without exudates or erythema  Eyes: anicteric sclera, PERRL Cardiac: regular rate and rhythm, nl S1/S2, no murmurs, rubs or gallops  Pulm: CTAB, no wheezes or crackles, no increased work of breathing  Abd: soft, NTND, bowel sounds present   Neuro: A&Ox3, no focal deficits noted  Ext: warm and well perfused, no peripheral edema     Assessment & Plan:   See Encounters Tab for problem based charting.  Patient seen with Dr. Rogelia BogaButcher

## 2017-05-08 NOTE — Assessment & Plan Note (Signed)
Patient complaining of worsening, bilateral  knee pain. States she is having difficulty standing for long periods of time, doing chores and walking from her bed to the bathroom. Has been using a lidocaine cream that a friend gave her with marked improvement in symptoms. Has also started exercising 3x/wk, though states this does not improve her pain. She is concerned about this worsening pain and would like to repeat XR of both knees.  She would also like to continue using the lidocaine cream and add an oral medication for management of OA. Declined PT.    - Repeating imaging not indicated at this time. Her worsening pain is likely due to weight gain. She gained 13 lbs from last visit.  - Encourage to continue exercising as weight loss will improve pain in the long term  - Lidocaine ointment 5% PRN  - Cymbalta 30mg  daily  - Consider intra-articular steroid injection if minimal improvement with lidocaine ointment and cymbalta

## 2017-05-10 NOTE — Progress Notes (Signed)
Internal Medicine Clinic Attending  I saw and evaluated the patient.  I personally confirmed the key portions of the history and exam documented by Dr. Santos-Sancheza and I reviewed pertinent patient test results.  The assessment, diagnosis, and plan were formulated together and I agree with the documentation in the resident's note. 

## 2017-06-14 ENCOUNTER — Other Ambulatory Visit: Payer: Self-pay | Admitting: Internal Medicine

## 2017-06-14 DIAGNOSIS — Z1231 Encounter for screening mammogram for malignant neoplasm of breast: Secondary | ICD-10-CM

## 2017-10-31 ENCOUNTER — Other Ambulatory Visit: Payer: Self-pay

## 2017-10-31 DIAGNOSIS — I10 Essential (primary) hypertension: Secondary | ICD-10-CM

## 2017-10-31 MED ORDER — HYDROCHLOROTHIAZIDE 12.5 MG PO TABS: 12.5000 mg | ORAL_TABLET | Freq: Every day | ORAL | 0 refills | Status: DC

## 2017-10-31 NOTE — Telephone Encounter (Signed)
Next appt scheduled  2/13 with PCP. 

## 2017-10-31 NOTE — Telephone Encounter (Signed)
hydrochlorothiazide (HYDRODIURIL) 12.5 MG tablet, REFILL REQUEST @ [facility] ON [address].

## 2017-10-31 NOTE — Telephone Encounter (Signed)
Patient was supposed to follow up with us 2 months ago for BP recheck. Will give 1 month supply of HCTZ as she might need adjustments in dose at next visit with me.

## 2017-11-08 ENCOUNTER — Encounter: Payer: Self-pay | Admitting: Internal Medicine

## 2017-12-06 ENCOUNTER — Ambulatory Visit (INDEPENDENT_AMBULATORY_CARE_PROVIDER_SITE_OTHER): Payer: PPO | Admitting: Internal Medicine

## 2017-12-06 ENCOUNTER — Encounter: Payer: Self-pay | Admitting: Internal Medicine

## 2017-12-06 VITALS — BP 149/69 | HR 77 | Temp 97.9°F | Ht 60.0 in | Wt 228.1 lb

## 2017-12-06 DIAGNOSIS — Z79899 Other long term (current) drug therapy: Secondary | ICD-10-CM

## 2017-12-06 DIAGNOSIS — I10 Essential (primary) hypertension: Secondary | ICD-10-CM | POA: Diagnosis not present

## 2017-12-06 DIAGNOSIS — Z0000 Encounter for general adult medical examination without abnormal findings: Secondary | ICD-10-CM

## 2017-12-06 DIAGNOSIS — M170 Bilateral primary osteoarthritis of knee: Secondary | ICD-10-CM

## 2017-12-06 MED ORDER — HYDROCHLOROTHIAZIDE 12.5 MG PO TABS: 12.5000 mg | ORAL_TABLET | Freq: Every day | ORAL | 2 refills | Status: DC

## 2017-12-06 NOTE — Assessment & Plan Note (Addendum)
Patient presenting with worsening of her bilateral knee pain.  Her Body mass index is 44.55 kg/m. She states that since January there has been increased pain in the left knee with the feeling like her knee may give out on her.  She has stopped using the lidocaine ointment prescribed in August and never took the Cymbalta.  She has gained weight since her last visit. She has been using Cannabinoid cream purchased OTC which provides about 30 minutes of relief at a time.  She also takes Tylenol that is not helpful.  She has previously declined steroid injections and NSAIDs.  She is interested in surgical evaluation if possible.  After discussion today, she was agreeable to steroid injection of the left knee.  Plan: - Refer to orthopedics - Steroid injection done.  See procedure note below - Return precautions given - Weight loss and exercise encouraged  Knee Injection Procedure Note  Diagnosis: left knee OA  Indications: Symptom relief from osteoarthritis  Anesthesia: Lidocaine 1% without epinephrine  Procedure Details   Point of care ultrasound was used to identify the joint space and plan needle trajectory and depth. Consent was obtained for the procedure. The joint was prepped with Betadine. A 21 gauge needle was inserted into the superior aspect of the joint from a medial approach to access the suprapatellar pouch. 2 ml 1% lidocaine and 1 ml of Triamcinolone was then injected into the joint through the same needle. The needle was removed and the area cleansed and dressed.  Complications:  None; patient tolerated the procedure well.  Addendum 5:01 PM 12/08/2017: - attempted to call patient to see how she was doing.  Phone went to voicemail but unable to leave message as mailbox had not been set up yet.

## 2017-12-06 NOTE — Progress Notes (Signed)
CC: follow up for knee OA  HPI:  [name] is a 67 y.o. woman with a past medical history listed below here today for follow up of her OA.  For details of today's visit and the status of her chronic medical issues please refer to the assessment and plan.   Past Medical History:  Diagnosis Date  . Elevated LFTs    history of, normal abdominal ultrasound, LFT's normal 01/2007  . History of chest pain    rulre out MI in 12/2006.  Marland Kitchen. Hyperlipidemia   . Hypertension   . Obesity   . Osteoarthritis of both knees 12/26/2007   Xray of knee 10/2007: Bilateral osteoarthritis, sent to PT but did not complete tx course.    . Osteopenia 07/05/2015  . Prediabetes 11/30/2015   Review of Systems:   Review of Systems  Constitutional: Negative for fever.  Respiratory: Negative for shortness of breath.   Cardiovascular: Negative for chest pain and leg swelling.  Musculoskeletal: Positive for joint pain. Negative for falls.     Physical Exam:  Vitals:   12/06/17 1456  BP: (!) 149/69  Pulse: 77  Temp: 97.9 F (36.6 C)  TempSrc: Oral  SpO2: 100%  Weight: 228 lb 1.6 oz (103.5 kg)  Height: 5' (1.524 m)   Physical Exam  Constitutional: She is well-developed, well-nourished, and in no distress.  HENT:  Head: Normocephalic and atraumatic.  Pulmonary/Chest: Effort normal.  Musculoskeletal: She exhibits tenderness and deformity.  She has tenderness of bilateral knees with decreased ROM with left moreso than right knee.  Physical landmarks were difficult to assess due to her habitus.   Skin: Skin is warm and dry.  Psychiatric: Mood and affect normal.    Assessment & Plan:   See Encounters Tab for problem based charting.  Patient seen with Dr. Oswaldo DoneVincent.  Essential hypertension BP Readings from Last 3 Encounters:  12/06/17 (!) 149/69  05/08/17 (!) 144/71  03/19/17 114/79   Her BP has been above goal the last 2 visits. She is requesting a refill today of her HCTZ.  PCP in August  had recommended increasing her HCTZ to 25 but patient declined.    Plan: - Continue HCTZ 12.5mg  daily - Will defer to PCP for further management of her BP - Refill provided today.   Healthcare maintenance She is asking for TB screening today for her employment that she needs annually.  Last checked in Feb 2018.  Plan: - TB Quant gold.  Will provide results to patient when available.   Osteoarthritis of both knees Patient presenting with worsening of her bilateral knee pain.  Her Body mass index is 44.55 kg/m. She states that since January there has been increased pain in the left knee with the feeling like her knee may give out on her.  She has stopped using the lidocaine ointment prescribed in August and never took the Cymbalta.  She has gained weight since her last visit. She has been using Cannabinoid cream purchased OTC which provides about 30 minutes of relief at a time.  She also takes Tylenol that is not helpful.  She has previously declined steroid injections and NSAIDs.  She is interested in surgical evaluation if possible.  After discussion today, she was agreeable to steroid injection of the left knee.  Plan: - Refer to orthopedics - Steroid injection done.  See procedure note below - Return precautions given - Weight loss and exercise encouraged  Knee Injection Procedure Note  Diagnosis: left knee  OA  Indications: Symptom relief from osteoarthritis  Anesthesia: Lidocaine 1% without epinephrine  Procedure Details   Point of care ultrasound was used to identify the joint space and plan needle trajectory and depth. Consent was obtained for the procedure. The joint was prepped with Betadine. A 21 gauge needle was inserted into the superior aspect of the joint from a medial approach to access the suprapatellar pouch. 2 ml 1% lidocaine and 1 ml of Triamcinolone was then injected into the joint through the same needle. The needle was removed and the area cleansed and  dressed.  Complications:  None; patient tolerated the procedure well.

## 2017-12-06 NOTE — Assessment & Plan Note (Signed)
BP Readings from Last 3 Encounters:  12/06/17 (!) 149/69  05/08/17 (!) 144/71  03/19/17 114/79   Her BP has been above goal the last 2 visits. She is requesting a refill today of her HCTZ.  PCP in August had recommended increasing her HCTZ to 25 but patient declined.    Plan: - Continue HCTZ 12.5mg  daily - Will defer to PCP for further management of her BP - Refill provided today.

## 2017-12-06 NOTE — Assessment & Plan Note (Signed)
She is asking for TB screening today for her employment that she needs annually.  Last checked in Feb 2018.  Plan: - TB Quant gold.  Will provide results to patient when available.

## 2017-12-07 NOTE — Progress Notes (Signed)
Internal Medicine Clinic Attending  I saw and evaluated the patient.  I personally confirmed the key portions of the history and exam documented by Dr. Earlene PlaterWallace and I reviewed pertinent patient test results.  The assessment, diagnosis, and plan were formulated together and I agree with the documentation in the resident's note. I was present for the entirety of the procedure.

## 2017-12-10 LAB — QUANTIFERON-TB GOLD PLUS
QUANTIFERON-TB GOLD PLUS: NEGATIVE
QuantiFERON Mitogen Value: >10 IU/mL
QuantiFERON Nil Value: 0.05 IU/mL
QuantiFERON TB1 Ag Value: 0.07 IU/mL
QuantiFERON TB2 Ag Value: 0.06 IU/mL

## 2017-12-11 ENCOUNTER — Other Ambulatory Visit: Payer: Self-pay | Admitting: Internal Medicine

## 2017-12-11 DIAGNOSIS — M170 Bilateral primary osteoarthritis of knee: Secondary | ICD-10-CM

## 2017-12-11 MED ORDER — LIDOCAINE 5 % EX OINT: 1.0000 "application " | TOPICAL_OINTMENT | Freq: Every day | CUTANEOUS | 3 refills | Status: DC | PRN

## 2017-12-15 ENCOUNTER — Ambulatory Visit: Payer: Self-pay | Admitting: Family Medicine

## 2017-12-18 ENCOUNTER — Ambulatory Visit (INDEPENDENT_AMBULATORY_CARE_PROVIDER_SITE_OTHER): Payer: PPO

## 2017-12-18 ENCOUNTER — Encounter (INDEPENDENT_AMBULATORY_CARE_PROVIDER_SITE_OTHER): Payer: Self-pay | Admitting: Orthopedic Surgery

## 2017-12-18 ENCOUNTER — Telehealth (INDEPENDENT_AMBULATORY_CARE_PROVIDER_SITE_OTHER): Payer: Self-pay

## 2017-12-18 ENCOUNTER — Ambulatory Visit (INDEPENDENT_AMBULATORY_CARE_PROVIDER_SITE_OTHER): Payer: PPO | Admitting: Orthopedic Surgery

## 2017-12-18 DIAGNOSIS — M25562 Pain in left knee: Secondary | ICD-10-CM | POA: Diagnosis not present

## 2017-12-18 DIAGNOSIS — M25561 Pain in right knee: Principal | ICD-10-CM

## 2017-12-18 DIAGNOSIS — M1712 Unilateral primary osteoarthritis, left knee: Secondary | ICD-10-CM | POA: Diagnosis not present

## 2017-12-18 DIAGNOSIS — M1711 Unilateral primary osteoarthritis, right knee: Secondary | ICD-10-CM

## 2017-12-18 DIAGNOSIS — G8929 Other chronic pain: Secondary | ICD-10-CM

## 2017-12-18 MED ORDER — METHYLPREDNISOLONE ACETATE 40 MG/ML IJ SUSP
40.0000 mg | INTRAMUSCULAR | Status: AC | PRN
  Administered 2017-12-18: 40 mg via INTRA_ARTICULAR

## 2017-12-18 MED ORDER — LIDOCAINE HCL 1 % IJ SOLN
5.0000 mL | INTRAMUSCULAR | Status: AC | PRN
  Administered 2017-12-18: 5 mL

## 2017-12-18 MED ORDER — BUPIVACAINE HCL 0.25 % IJ SOLN
4.0000 mL | INTRAMUSCULAR | Status: AC | PRN
  Administered 2017-12-18: 4 mL via INTRA_ARTICULAR

## 2017-12-18 NOTE — Progress Notes (Signed)
Office Visit Note   Patient: Darlene [name]           Date of Birth: 13-Oct-1950           MRN: [id number] Visit Date: 12/18/2017 Requested by: Gwynn BurlyWallace, Andrew, DO [address] [place], [place] [id number] PCP: Burna CashSantos-Sanchez, Idalys, MD  Subjective: Chief Complaint  Patient presents with  . Left Knee - Pain  . Right Knee - Pain    HPI: Darlene [name] is a patient with bilateral knee pain left worse than right.  Reports pain for many years but it has been worse recently.  She states that she cannot stand or wear heels for long period of time.  Her ambulatory endurance is less than one half of a city block.  Cortisone shots help a little bit.  She does sit down work.  Denies any history of injury and has had no prior surgeries in either knee.  Her [facility]  That has helped her diminish the pain some.              ROS: All systems reviewed are negative as they relate to the chief complaint within the history of present illness.  Patient denies  fevers or chills.   Assessment & Plan: Visit Diagnoses:  1. Chronic pain of both knees   2. Unilateral primary osteoarthritis, left knee   3. Unilateral primary osteoarthritis, right knee     Plan: Impression is bilateral knee pain left worse than right with end-stage osteoarthritis in a patient with BMI 44.3.  She would have to lose 27 pounds to get under that 40 BMI mark.  Today right knee is injected with cortisone and we will preapproved for for Synvisc.  I will see her back when the cortisone wears off and she wants to get both knees injected with the gel.  Follow-Up Instructions: Return if symptoms worsen or fail to improve.   Orders:  Orders Placed This Encounter  Procedures  . XR KNEE 3 VIEW LEFT  . XR KNEE 3 VIEW RIGHT   No orders of the defined types were placed in this encounter.     Procedures: Large Joint Inj: R knee on 12/18/2017 9:29 PM Indications: diagnostic  evaluation, joint swelling and pain Details: 18 G 1.5 in needle, superolateral approach  Arthrogram: No  Medications: 5 mL lidocaine 1 %; 40 mg methylPREDNISolone acetate 40 MG/ML; 4 mL bupivacaine 0.25 % Outcome: tolerated well, no immediate complications Procedure, treatment alternatives, risks and benefits explained, specific risks discussed. Consent was given by the patient. Immediately prior to procedure a time out was called to verify the correct patient, procedure, equipment, support staff and site/side marked as required. Patient was prepped and draped in the usual sterile fashion.       Clinical Data: No additional findings.  Objective: Vital Signs: There were no vitals taken for this visit.  Physical Exam:   Constitutional: Patient appears well-developed HEENT:  Head: Normocephalic Eyes:EOM are normal Neck: Normal range of motion Cardiovascular: Normal rate Pulmonary/chest: Effort normal Neurologic: Patient is alert Skin: Skin is warm Psychiatric: Patient has normal mood and affect    Ortho Exam: Orthopedic exam demonstrates full extension of both knees.  Flexion is past 90 on both [place].  Pulses are somewhat diminished bilaterally but they are dopplerable.  Ankle dorsiflexion plantarflexion is intact.  Slight varus alignment is noted upon visual inspection.  Collateral and cruciate ligaments are stable and extensor mechanism  is intact.  No effusion in either knee.  Patient has medial greater than lateral joint line tenderness.  Specialty Comments:  No specialty comments available.  Imaging: Xr Knee 3 View Left  Result Date: 12/18/2017 AP lateral merchant left knee reviewed.  Severe tricompartmental osteoarthritis is present most severe in the medial compartment.  No fracture or dislocation is present.  Varus alignment is noted.  Bone quality appears reasonable.  Xr Knee 3 View Right  Result Date: 12/18/2017 AP lateral merchant right knee reviewed.  End-stage  tricompartmental osteoarthritis is present worse in the medial compartment.  Varus alignment is noted.  No fracture or dislocation is seen.  Bone quality appears reasonable.    PMFS History: Patient Active Problem List   Diagnosis Date Noted  . Bacterial vaginosis 05/08/2017  . Dizziness 01/30/2017  . BPPV (benign paroxysmal positional vertigo), right 01/30/2017  . Prediabetes 11/30/2015  . Osteopenia 07/05/2015  . Healthcare maintenance 10/04/2011  . Osteoarthritis of both knees 12/26/2007  . Dyslipidemia 09/30/2006  . OBESITY NOS 09/30/2006  . Essential hypertension 07/31/2006   Past Medical History:  Diagnosis Date  . Elevated LFTs    history of, normal abdominal ultrasound, LFT's normal 01/2007  . History of chest pain    rulre out MI in 12/2006.  Marland Kitchen Hyperlipidemia   . Hypertension   . Obesity   . Osteoarthritis of both knees 12/26/2007   Xray of knee 10/2007: Bilateral osteoarthritis, sent to PT but did not complete tx course.    . Osteopenia 07/05/2015  . Prediabetes 11/30/2015    History reviewed. No pertinent family history.  Past Surgical History:  Procedure Laterality Date  . ABDOMINAL HYSTERECTOMY    . CHOLECYSTECTOMY     Social History   Occupational History  . Not on file  Tobacco Use  . Smoking status: Never Smoker  . Smokeless tobacco: Never Used  Substance and Sexual Activity  . Alcohol use: No  . Drug use: No  . Sexual activity: Never

## 2017-12-18 NOTE — Telephone Encounter (Signed)
Can we see about getting patient approved for bilateral monovisc injections? Thanks.

## 2017-12-19 NOTE — Telephone Encounter (Signed)
Noted.  Submitted application online for Monovisc Inj., Bilateral Knee.

## 2017-12-20 ENCOUNTER — Telehealth (INDEPENDENT_AMBULATORY_CARE_PROVIDER_SITE_OTHER): Payer: Self-pay

## 2017-12-20 DIAGNOSIS — H52223 Regular astigmatism, bilateral: Secondary | ICD-10-CM | POA: Diagnosis not present

## 2017-12-20 DIAGNOSIS — H5203 Hypermetropia, bilateral: Secondary | ICD-10-CM | POA: Diagnosis not present

## 2017-12-20 NOTE — Telephone Encounter (Signed)
Patient returned my call concerning Monovisc Inj., bilateral knee.    Advised her that she is covered at 100%.   Bilateral Knee Buy & Bill May have a co-pay of $20.00 Patient would like to wait to get Gel injection. Will CB when ready.

## 2017-12-20 NOTE — Telephone Encounter (Signed)
Tried to call patient to schedule appt.for Bilateral Knee, Monovisc Inj., but no answer and no VM set-up to leave message.  Will try again later.

## 2017-12-23 DIAGNOSIS — Z961 Presence of intraocular lens: Secondary | ICD-10-CM | POA: Diagnosis not present

## 2018-01-04 ENCOUNTER — Other Ambulatory Visit: Payer: Self-pay | Admitting: Internal Medicine

## 2018-01-04 DIAGNOSIS — I10 Essential (primary) hypertension: Secondary | ICD-10-CM

## 2018-01-19 ENCOUNTER — Encounter: Payer: Self-pay | Admitting: *Deleted

## 2018-01-31 ENCOUNTER — Ambulatory Visit: Payer: PPO | Admitting: Family Medicine

## 2018-02-22 ENCOUNTER — Encounter: Payer: PPO | Admitting: Internal Medicine

## 2018-02-26 ENCOUNTER — Ambulatory Visit (INDEPENDENT_AMBULATORY_CARE_PROVIDER_SITE_OTHER): Payer: PPO | Admitting: Family Medicine

## 2018-02-26 ENCOUNTER — Telehealth (INDEPENDENT_AMBULATORY_CARE_PROVIDER_SITE_OTHER): Payer: Self-pay | Admitting: Orthopedic Surgery

## 2018-02-26 ENCOUNTER — Telehealth (INDEPENDENT_AMBULATORY_CARE_PROVIDER_SITE_OTHER): Payer: Self-pay

## 2018-02-26 ENCOUNTER — Encounter: Payer: Self-pay | Admitting: Family Medicine

## 2018-02-26 VITALS — BP 126/84 | HR 85 | Temp 99.2°F | Resp 16 | Ht 60.0 in | Wt 225.4 lb

## 2018-02-26 DIAGNOSIS — E785 Hyperlipidemia, unspecified: Secondary | ICD-10-CM

## 2018-02-26 DIAGNOSIS — Z6841 Body Mass Index (BMI) 40.0 and over, adult: Secondary | ICD-10-CM

## 2018-02-26 DIAGNOSIS — I10 Essential (primary) hypertension: Secondary | ICD-10-CM | POA: Diagnosis not present

## 2018-02-26 DIAGNOSIS — R7303 Prediabetes: Secondary | ICD-10-CM

## 2018-02-26 DIAGNOSIS — M170 Bilateral primary osteoarthritis of knee: Secondary | ICD-10-CM | POA: Diagnosis not present

## 2018-02-26 DIAGNOSIS — Z1231 Encounter for screening mammogram for malignant neoplasm of breast: Secondary | ICD-10-CM

## 2018-02-26 DIAGNOSIS — Z1239 Encounter for other screening for malignant neoplasm of breast: Secondary | ICD-10-CM

## 2018-02-26 NOTE — Telephone Encounter (Signed)
Okay to schedule.  I can call and make the appt.

## 2018-02-26 NOTE — Telephone Encounter (Signed)
Patient called asked if she can get  the gel injections in both of her knees. The number to contact patient is [phone]

## 2018-02-26 NOTE — Telephone Encounter (Signed)
I see notes in chart from where you had gotten prior auth for gel injections. Do we need to repeat or ok to schedule? Please advise. Thanks.

## 2018-02-26 NOTE — Telephone Encounter (Signed)
Talked with patient and appointment is scheduled for 03/07/18 for Monovisc injection, bilateral knee.

## 2018-02-26 NOTE — Telephone Encounter (Signed)
I think Lauren meant to send to you.

## 2018-02-26 NOTE — Telephone Encounter (Signed)
Ok sounds good! Thanks so much!

## 2018-02-26 NOTE — Progress Notes (Signed)
HPI:   Darlene [name] is a 67 y.o. female, who is here today to establish care.  Former PCP: Dr Earlene Plater Last preventive routine visit: 11/2017.  Chronic medical problems: HTN,prediabetes,OA,obesity,and depression among some.   Concerns today: Sister have been Dx with breast cancer. One of her sister is having lumpectomy and genetic testing was recommended,this is being arranged by oncologist.  She would like to have a mammogram schedule. Negative for breast tenderness,masses,skin changes,or nipple discharge. Last mammogram 2016.  She also would like labs done: Renal function and diabetes screening.    Hyperlipidemia:  Currently on non pharmacologic treatment. Following a low fat diet: Yes..  She took Crestor last year but discontinued because LE pain.   Lab Results  Component Value Date   CHOL 214 (H) 10/28/2016   HDL 72 10/28/2016   LDLCALC 122 (H) 10/28/2016   TRIG 99 10/28/2016   CHOLHDL 3.0 10/28/2016     HTN:  She is on HCTZ  12.5 mg daily. Denies severe/frequent headache, visual changes, chest pain, dyspnea, palpitation, claudication, focal weakness, or edema.  Prediabetes: HgA1C was 5.8 in 04/2017.  Denies abdominal pain, nausea,vomiting, polydipsia,polyuria, or polyphagia.  She has not been consistent with regular exercise,she does the elliptical sometimes.  OA,mainly affecting knees. Pain is exacerbated by walking down stairs. Alleviated by rest.  She follows with ortho.   Review of Systems  Constitutional: Negative for activity change, appetite change, fatigue and fever.  HENT: Negative for mouth sores, nosebleeds and trouble swallowing.   Eyes: Negative for redness and visual disturbance.  Respiratory: Negative for cough, shortness of breath and wheezing.   Cardiovascular: Negative for chest pain, palpitations and leg swelling.  Gastrointestinal: Negative for abdominal pain, nausea and vomiting.       Negative for changes  in bowel habits.  Endocrine: Negative for polydipsia, polyphagia and polyuria.  Genitourinary: Negative for decreased urine volume, dysuria and hematuria.  Musculoskeletal: Positive for arthralgias. Negative for gait problem.  Skin: Negative for rash and wound.  Neurological: Negative for syncope, weakness and headaches.  Psychiatric/Behavioral: Negative for confusion. The patient is nervous/anxious.       Current Outpatient Medications on File Prior to Visit  Medication Sig Dispense Refill  . hydrochlorothiazide (HYDRODIURIL) 12.5 MG tablet TAKE 1 TABLET(12.5 MG) BY MOUTH DAILY 90 tablet 2   No current facility-administered medications on file prior to visit.      Past Medical History:  Diagnosis Date  . Elevated LFTs    history of, normal abdominal ultrasound, LFT's normal 01/2007  . History of chest pain    rulre out MI in 12/2006.  Marland Kitchen Hyperlipidemia   . Hypertension   . Obesity   . Osteoarthritis of both knees 12/26/2007   Xray of knee 10/2007: Bilateral osteoarthritis, sent to PT but did not complete tx course.    . Osteopenia 07/05/2015  . Prediabetes 11/30/2015   Allergies  Allergen Reactions  . Codeine   . Propoxyphene N-Acetaminophen     REACTION: "makes me sick"    Family History  Problem Relation Age of Onset  . Cancer Sister   . Breast cancer Sister   . Heart attack Brother     Social History   Socioeconomic History  . Marital status: Single    Spouse name: Not on file  . Number of children: 2  . Years of education: Not on file  . Highest education level: Not on file  Occupational History  . Not on file  Social Needs  . Financial resource strain: Not on file  . Food insecurity:    Worry: Not on file    Inability: Not on file  . Transportation needs:    Medical: Not on file    Non-medical: Not on file  Tobacco Use  . Smoking status: Never Smoker  . Smokeless tobacco: Never Used  Substance and Sexual Activity  . Alcohol use: No  . Drug use: No    . Sexual activity: Never  Lifestyle  . Physical activity:    Days per week: Not on file    Minutes per session: Not on file  . Stress: Not on file  Relationships  . Social connections:    Talks on phone: Not on file    Gets together: Not on file    Attends religious service: Not on file    Active member of club or organization: Not on file    Attends meetings of clubs or organizations: Not on file    Relationship status: Not on file  Other Topics Concern  . Not on file  Social History Narrative   Lives by herself.    Vitals:   02/26/18 0914  BP: 126/84  Pulse: 85  Resp: 16  Temp: 99.2 F (37.3 C)  SpO2: 97%    Body mass index is 44.02 kg/m.    Physical Exam  Nursing note and vitals reviewed. Constitutional: She is oriented to person, place, and time. She appears well-developed. No distress.  HENT:  Head: Normocephalic and atraumatic.  Mouth/Throat: Oropharynx is clear and moist and mucous membranes are normal.  Eyes: Pupils are equal, round, and reactive to light. Conjunctivae are normal.  Cardiovascular: Normal rate and regular rhythm.  No murmur heard. Pulses:      Dorsalis pedis pulses are 2+ on the right side, and 2+ on the left side.  Respiratory: Effort normal and breath sounds normal. No respiratory distress.  GI: Soft. She exhibits no mass. There is no hepatomegaly. There is no tenderness.  Musculoskeletal: She exhibits no edema.       Right knee: She exhibits decreased range of motion. She exhibits no effusion.       Left knee: She exhibits decreased range of motion. She exhibits no effusion.  Knee crepitus bilateral. No erythema.  Lymphadenopathy:    She has no cervical adenopathy.  Neurological: She is alert and oriented to person, place, and time. She has normal strength.  Skin: Skin is warm. No erythema.  Psychiatric: She has a normal mood and affect.  Well groomed, good eye contact.      ASSESSMENT AND PLAN:  Darlene [name] was seen today  for establish care.  Diagnoses and all orders for this visit:  Lab Results  Component Value Date   CHOL 200 03/01/2018   HDL 67.90 03/01/2018   LDLCALC 113 (H) 03/01/2018   TRIG 96.0 03/01/2018   CHOLHDL 3 03/01/2018   Lab Results  Component Value Date   CREATININE 0.77 03/01/2018   BUN 18 03/01/2018   NA 141 03/01/2018   K 4.8 03/01/2018   CL 105 03/01/2018   CO2 27 03/01/2018   Lab Results  Component Value Date   HGBA1C 5.9 03/01/2018    Osteoarthritis of both knees, unspecified osteoarthritis type  Topical icy hot with Lidocaine may help. OTC Tylenol tid as needed. Wt loss may help with progression. Continue following with ortho.   Essential hypertension  Adequately controlled. No changes in current management. Low salt diet to  continue. Eye exam current,11/2017. F/U in 5 months, before if needed.  -     Basic metabolic panel; Future  Prediabetes  Healthy lifestyle for primary prevention.  -     Hemoglobin A1c; Future  Dyslipidemia  She did not tolerate Crestor. Continue non pharmacologic treatment. Further recommendations will be given according to FLP results.  The 10-year ASCVD risk score Denman George DC Montez Hageman., et al., 2013) is: 8.4%   Values used to calculate the score:     Age: 34 years     Sex: Female     Is Non-Hispanic African American: Yes     Diabetic: No     Tobacco smoker: No     Systolic Blood Pressure: 126 mmHg     Is BP treated: Yes     HDL Cholesterol: 67.9 mg/dL     Total Cholesterol: 200 mg/dL  -     Lipid panel; Future  Breast cancer screening -     MM 3D SCREEN BREAST BILATERAL; Future  Morbid obesity with BMI of 40.0-44.9, adult (HCC)  We discussed benefits of wt loss as well as adverse effects of obesity. Consistency with healthy diet and physical activity as tolerated.      Betty G. [place], MD  [facility]. [place] office.

## 2018-02-26 NOTE — Patient Instructions (Addendum)
A few things to remember from today's visit:   Essential hypertension - Plan: Basic metabolic panel  Prediabetes - Plan: Hemoglobin A1c  Dyslipidemia - Plan: Lipid panel  Breast cancer screening - Plan: MM 3D SCREEN BREAST BILATERAL   Please be sure medication list is accurate. If a new problem present, please set up appointment sooner than planned today.

## 2018-03-01 ENCOUNTER — Other Ambulatory Visit (INDEPENDENT_AMBULATORY_CARE_PROVIDER_SITE_OTHER): Payer: PPO

## 2018-03-01 DIAGNOSIS — I10 Essential (primary) hypertension: Secondary | ICD-10-CM | POA: Diagnosis not present

## 2018-03-01 DIAGNOSIS — E785 Hyperlipidemia, unspecified: Secondary | ICD-10-CM | POA: Diagnosis not present

## 2018-03-01 DIAGNOSIS — R7303 Prediabetes: Secondary | ICD-10-CM

## 2018-03-01 LAB — BASIC METABOLIC PANEL
BUN: 18 mg/dL (ref 6–23)
CHLORIDE: 105 meq/L (ref 96–112)
CO2: 27 meq/L (ref 19–32)
Calcium: 9.6 mg/dL (ref 8.4–10.5)
Creatinine, Ser: 0.77 mg/dL (ref 0.40–1.20)
GFR: 96.67 mL/min (ref >60.00)
Glucose, Bld: 99 mg/dL (ref 70–99)
POTASSIUM: 4.8 meq/L (ref 3.5–5.1)
Sodium: 141 mEq/L (ref 135–145)

## 2018-03-01 LAB — LIPID PANEL
CHOL/HDL RATIO: 3
Cholesterol: 200 mg/dL (ref 0–200)
HDL: 67.9 mg/dL (ref >39.00)
LDL Cholesterol: 113 mg/dL — ABNORMAL HIGH (ref 0–99)
NonHDL: 131.78
TRIGLYCERIDES: 96 mg/dL (ref 0.0–149.0)
VLDL: 19.2 mg/dL (ref 0.0–40.0)

## 2018-03-01 LAB — HEMOGLOBIN A1C: HEMOGLOBIN A1C: 5.9 % (ref 4.6–6.5)

## 2018-03-06 ENCOUNTER — Other Ambulatory Visit: Payer: Self-pay | Admitting: *Deleted

## 2018-03-06 MED ORDER — SIMVASTATIN 10 MG PO TABS: 10.0000 mg | ORAL_TABLET | Freq: Every day | ORAL | 3 refills | Status: DC

## 2018-03-07 ENCOUNTER — Encounter (INDEPENDENT_AMBULATORY_CARE_PROVIDER_SITE_OTHER): Payer: Self-pay | Admitting: Orthopedic Surgery

## 2018-03-07 ENCOUNTER — Ambulatory Visit (INDEPENDENT_AMBULATORY_CARE_PROVIDER_SITE_OTHER): Payer: PPO | Admitting: Orthopedic Surgery

## 2018-03-07 DIAGNOSIS — M1712 Unilateral primary osteoarthritis, left knee: Secondary | ICD-10-CM

## 2018-03-07 DIAGNOSIS — M1711 Unilateral primary osteoarthritis, right knee: Secondary | ICD-10-CM

## 2018-03-09 ENCOUNTER — Telehealth: Payer: Self-pay | Admitting: Genetic Counselor

## 2018-03-09 NOTE — Telephone Encounter (Signed)
Pt called wanting to schedule a genetic counseling appt to be tested for PALB. Pt has been scheduled to see Maylon CosKaren Powell on 6/17 at 10am. Pt aware to arrive 30 minutes early.

## 2018-03-10 ENCOUNTER — Encounter (INDEPENDENT_AMBULATORY_CARE_PROVIDER_SITE_OTHER): Payer: Self-pay | Admitting: Orthopedic Surgery

## 2018-03-10 DIAGNOSIS — M1712 Unilateral primary osteoarthritis, left knee: Secondary | ICD-10-CM

## 2018-03-10 DIAGNOSIS — M1711 Unilateral primary osteoarthritis, right knee: Secondary | ICD-10-CM

## 2018-03-10 MED ORDER — HYALURONAN 88 MG/4ML IX SOSY
88.0000 mg | PREFILLED_SYRINGE | INTRA_ARTICULAR | Status: AC | PRN
  Administered 2018-03-10: 88 mg via INTRA_ARTICULAR

## 2018-03-10 MED ORDER — LIDOCAINE HCL 1 % IJ SOLN
5.0000 mL | INTRAMUSCULAR | Status: AC | PRN
  Administered 2018-03-10: 5 mL

## 2018-03-10 NOTE — Progress Notes (Signed)
   Procedure Note  Patient: Darlene [name]             Date of Birth: 10-22-1950           MRN: [id number]             Visit Date: 03/07/2018  Procedures: Visit Diagnoses: Unilateral primary osteoarthritis, left knee  Unilateral primary osteoarthritis, right knee  Large Joint Inj: bilateral knee on 03/10/2018 2:42 PM Indications: pain, joint swelling and diagnostic evaluation Details: 18 G 1.5 in needle, superolateral approach  Arthrogram: No  Medications (Right): 5 mL lidocaine 1 %; 88 mg Hyaluronan 88 MG/4ML Medications (Left): 5 mL lidocaine 1 %; 88 mg Hyaluronan 88 MG/4ML Outcome: tolerated well, no immediate complications Procedure, treatment alternatives, risks and benefits explained, specific risks discussed. Consent was given by the patient. Immediately prior to procedure a time out was called to verify the correct patient, procedure, equipment, support staff and site/side marked as required. Patient was prepped and draped in the usual sterile fashion.

## 2018-03-12 ENCOUNTER — Inpatient Hospital Stay: Payer: PPO | Attending: Genetic Counselor | Admitting: Genetic Counselor

## 2018-03-12 ENCOUNTER — Inpatient Hospital Stay: Payer: PPO

## 2018-03-12 ENCOUNTER — Encounter: Payer: Self-pay | Admitting: Genetic Counselor

## 2018-03-12 DIAGNOSIS — Z8051 Family history of malignant neoplasm of kidney: Secondary | ICD-10-CM | POA: Diagnosis not present

## 2018-03-12 DIAGNOSIS — Z8042 Family history of malignant neoplasm of prostate: Secondary | ICD-10-CM | POA: Diagnosis not present

## 2018-03-12 DIAGNOSIS — Z803 Family history of malignant neoplasm of breast: Secondary | ICD-10-CM | POA: Diagnosis not present

## 2018-03-12 DIAGNOSIS — Z315 Encounter for genetic counseling: Secondary | ICD-10-CM

## 2018-03-12 NOTE — Progress Notes (Signed)
REFERRING PROVIDER: Martinique, Betty G, MD [address] [place], [place] [address]  PRIMARY PROVIDER:  Martinique, Betty G, MD  PRIMARY REASON FOR VISIT:  1. Family history of breast cancer   2. Family history of prostate cancer   3. Family history of kidney cancer      HISTORY OF PRESENT ILLNESS:   Darlene [name], a 67 y.o. female, was seen for a [place] cancer genetics consultation at the request of Dr. Martinique due to a family history of cancer.  Darlene [name] presents to clinic today to discuss the possibility of a hereditary predisposition to cancer, genetic testing, and to further clarify her future cancer risks, as well as potential cancer risks for family members.   Darlene [name] is a 67 y.o. female with no personal history of cancer.  Her sister recently was diagnosed with breast cancer and was found to have a pathogenic variant in [place].  The patient would like to get tested for this so that she can put preventive measures in place if needed.  CANCER HISTORY:   No history exists.     HORMONAL RISK FACTORS:  Menarche was at age 89.  First live birth at age 32.  OCP use for approximately 0 years.  Ovaries intact: 1 ovary.  Hysterectomy: yes.  Menopausal status: postmenopausal.  HRT use: 0 years. Colonoscopy: no; not examined. Mammogram within the last year: yes. Number of breast biopsies: 0. Up to date with pelvic exams:  yes. Any excessive radiation exposure in the past:  no  Past Medical History:  Diagnosis Date  . Elevated LFTs    history of, normal abdominal ultrasound, LFT's normal 01/2007  . Family history of breast cancer   . Family history of kidney cancer   . Family history of prostate cancer   . History of chest pain    rulre out MI in 12/2006.  Marland Kitchen Hyperlipidemia   . Hypertension   . Obesity   . Osteoarthritis of both knees 12/26/2007   Xray of knee 10/2007: Bilateral osteoarthritis, sent to PT but did not complete tx course.    . Osteopenia 07/05/2015  .  Prediabetes 11/30/2015    Past Surgical History:  Procedure Laterality Date  . ABDOMINAL HYSTERECTOMY  1985   partial  . APPENDECTOMY  1985  . CHOLECYSTECTOMY  1985    Social History   Socioeconomic History  . Marital status: Single    Spouse name: Not on file  . Number of children: 2  . Years of education: Not on file  . Highest education level: Not on file  Occupational History  . Not on file  Social Needs  . Financial resource strain: Not on file  . Food insecurity:    Worry: Not on file    Inability: Not on file  . Transportation needs:    Medical: Not on file    Non-medical: Not on file  Tobacco Use  . Smoking status: Never Smoker  . Smokeless tobacco: Never Used  Substance and Sexual Activity  . Alcohol use: No  . Drug use: No  . Sexual activity: Never  Lifestyle  . Physical activity:    Days per week: Not on file    Minutes per session: Not on file  . Stress: Not on file  Relationships  . Social connections:    Talks on phone: Not on file    Gets together: Not on file    Attends religious service: Not on file    Active member  of club or organization: Not on file    Attends meetings of clubs or organizations: Not on file    Relationship status: Not on file  Other Topics Concern  . Not on file  Social History Narrative   Lives by herself.     FAMILY HISTORY:  We obtained a detailed, 4-generation family history.  Significant diagnoses are listed below: Family History  Problem Relation Age of Onset  . Heart disease Mother   . Kidney cancer Father   . Cancer Sister   . Breast cancer Sister 30       PALB2+  . Heart attack Brother   . Prostate cancer Brother 15  . Cancer Paternal Aunt        NOS  . Prostate cancer Paternal Uncle   . Breast cancer Sister 22  . Breast cancer Sister 32    The patient has a son and daughter who are cancer free.  She has seven sisters and six brothers.  Three sisters had breast cancer and one tested positive for a  PALB2 mutation.  One brother was diagnosed with prostate cancer.  Both parents are deceased.  Her father had kidney cancer and mother had heart disease.  The patient's mother had 6-7 siblings who are all deceased and cancer free.  The maternal grandparents are deceased.  The patient's father had two brothers and three sisters.  One brother had prostate cancer and one sister had an unknown cancer.  Both paternal grandparents are deceased.  Patient's maternal ancestors are of African American descent, and paternal ancestors are of African American descent. There is no reported Ashkenazi Jewish ancestry. There is no known consanguinity.  GENETIC COUNSELING ASSESSMENT: Darlene [name] is a 67 y.o. female with a family history of breast and prostate cancer and a known PALB2 familial mutation which is somewhat suggestive of a hereditary cancer syndrome and predisposition to cancer. We, therefore, discussed and recommended the following at today's visit.   DISCUSSION: We discussed that about 5-10% of breast cancer is hereditary with most cases due to BRCA mutations.  PALB2 mutations are considered to be moderate to high risk mutations, with risks for breast cancer reaching up to 50% life times risk.  Based on the patient's sister with a known mutation, she has a 50% chance of also having a PALB2 mutation.  We discussed that we would recommend screening recommendations if she were found to carry a mutation, and at that point we would offer genetic testing to her children.  If she is negative, we would not need to offer testing to her children.  Based on the policy of the laboratory who tested her sister, Darlene [name] can undergo genetic testing for free.  We reviewed the characteristics, features and inheritance patterns of hereditary cancer syndromes. We also discussed genetic testing, including the appropriate family members to test, the process of testing, insurance coverage and turn-around-time for  results. We discussed the implications of a negative, positive and/or variant of uncertain significant result. We recommended Darlene [name] pursue genetic testing for the PALB2 familial gene variant.   Based on Darlene [name] family history of cancer, she meets medical criteria for genetic testing. Despite that she meets criteria, she may still have an out of pocket cost. We discussed that if her out of pocket cost for testing is over $100, the laboratory will call and confirm whether she wants to proceed with testing.  If the out of pocket cost of testing is less than $100 she  will be billed by the genetic testing laboratory.   We discussed that some people do not want to undergo genetic testing due to fear of genetic discrimination.  A federal law called the Genetic Information Non-Discrimination Act (GINA) of 2008 helps protect individuals against genetic discrimination based on their genetic test results.  It impacts both health insurance and employment.  With health insurance, it protects against increased premiums, being kicked off insurance or being forced to take a test in order to be insured.  For employment it protects against hiring, firing and promoting decisions based on genetic test results.  Health status due to a cancer diagnosis is not protected under GINA.   PLAN: After considering the risks, benefits, and limitations, Darlene [name]  provided informed consent to pursue genetic testing and the blood sample was sent to [facility] for analysis of the PALB2 gene. Results should be available within approximately 2-3 weeks' time, at which point they will be disclosed by telephone to Darlene [name], as will any additional recommendations warranted by these results. Darlene [name] will receive a summary of her genetic counseling visit and a copy of her results once available. This information will also be available in Epic. We encouraged Darlene [name] to remain in contact with cancer genetics annually so that we  can continuously update the family history and inform her of any changes in cancer genetics and testing that may be of benefit for her family. Darlene [name] questions were answered to her satisfaction today. Our contact information was provided should additional questions or concerns arise.  Lastly, we encouraged Darlene [name] to remain in contact with cancer genetics annually so that we can continuously update the family history and inform her of any changes in cancer genetics and testing that may be of benefit for this family.   Ms.  [name] questions were answered to her satisfaction today. Our contact information was provided should additional questions or concerns arise. Thank you for the referral and allowing [place] to share in the care of your patient.   Karen P. Florene Glen, [place], [facility] Certified Genetic Counselor [email or web address] phone: [phone]  The patient was seen for a total of 30 minutes in face-to-face genetic counseling.  This patient was discussed with Drs. Magrinat, Lindi Adie and/or Burr Medico who agrees with the above.    _______________________________________________________________________ For Office Staff:  Number of people involved in session: 1 Was an Intern/ student involved with case: no

## 2018-03-19 ENCOUNTER — Ambulatory Visit: Payer: PPO

## 2018-03-22 ENCOUNTER — Encounter: Payer: PPO | Admitting: Internal Medicine

## 2018-04-02 ENCOUNTER — Telehealth: Payer: Self-pay | Admitting: Genetic Counselor

## 2018-04-02 ENCOUNTER — Encounter: Payer: Self-pay | Admitting: Genetic Counselor

## 2018-04-02 DIAGNOSIS — Z1379 Encounter for other screening for genetic and chromosomal anomalies: Secondary | ICD-10-CM | POA: Insufficient documentation

## 2018-04-02 NOTE — Telephone Encounter (Signed)
Revealed negative genetic testing on her PALB2 familial testing.  She does not have the familial PALB2 mutation.  We only tested this one gene and therefore there is always the chance that we could have missed something, but that the likelihood of this is low.  Released a copy of her report to her.

## 2018-04-12 ENCOUNTER — Ambulatory Visit: Payer: Self-pay | Admitting: Genetic Counselor

## 2018-04-12 ENCOUNTER — Ambulatory Visit
Admission: RE | Admit: 2018-04-12 | Discharge: 2018-04-12 | Disposition: A | Discharge status: Home / Self Care | Payer: PPO | Source: Ambulatory Visit | Attending: Family Medicine | Admitting: Family Medicine

## 2018-04-12 DIAGNOSIS — Z1231 Encounter for screening mammogram for malignant neoplasm of breast: Secondary | ICD-10-CM | POA: Diagnosis not present

## 2018-04-12 DIAGNOSIS — Z1239 Encounter for other screening for malignant neoplasm of breast: Secondary | ICD-10-CM

## 2018-04-12 DIAGNOSIS — Z1379 Encounter for other screening for genetic and chromosomal anomalies: Secondary | ICD-10-CM

## 2018-04-12 NOTE — Progress Notes (Signed)
HPI:  Ms. Flagler was previously seen in the [place] clinic due to a family history of cancer and concerns regarding a hereditary predisposition to cancer. Please refer to our prior cancer genetics clinic note for more information regarding Ms. Spillane's medical, social and family histories, and our assessment and recommendations, at the time. Ms. Heaps recent genetic test results were disclosed to her, as were recommendations warranted by these results. These results and recommendations are discussed in more detail below.  CANCER HISTORY:   No history exists.    FAMILY HISTORY:  We obtained a detailed, 4-generation family history.  Significant diagnoses are listed below: Family History  Problem Relation Age of Onset  . Heart disease Mother   . Kidney cancer Father   . Cancer Sister   . Breast cancer Sister 61       PALB2+  . Heart attack Brother   . Prostate cancer Brother 34  . Cancer Paternal Aunt        NOS  . Prostate cancer Paternal Uncle   . Breast cancer Sister 38  . Breast cancer Sister 59    The patient has a son and daughter who are cancer free.  She has seven sisters and six brothers.  Three sisters had breast cancer and one tested positive for a PALB2 mutation.  One brother was diagnosed with prostate cancer.  Both parents are deceased.  Her father had kidney cancer and mother had heart disease.  The patient's mother had 6-7 siblings who are all deceased and cancer free.  The maternal grandparents are deceased.  The patient's father had two brothers and three sisters.  One brother had prostate cancer and one sister had an unknown cancer.  Both paternal grandparents are deceased.  Patient's maternal ancestors are of African American descent, and paternal ancestors are of African American descent. There is no reported Ashkenazi Jewish ancestry. There is no known consanguinity.  GENETIC TEST RESULTS:  We recommended Ms. Chachere pursue testing for the  familial hereditary cancer gene mutation called PALB2, [id number] ([id number]). Ms. Guzy test was normal and did not reveal the familial mutation. We call this result a true negative result because the cancer-causing mutation was identified in Ms. Sawyer's family, and she did not inherit it.  Given this negative result, Ms. Lentz's chances of developing PALB2-related cancers are the same as they are in the general population.      CANCER SCREENING RECOMMENDATIONS: This normal result is reassuring and indicates that Ms. Mccollam does not likely have an increased risk of cancer due to a mutation in one of these genes.  We, therefore, recommended  Ms. Mestas continue to follow the cancer screening guidelines provided by her primary healthcare providers.   An individual's cancer risk and medical management are not determined by genetic test results alone. Overall cancer risk assessment incorporates additional factors, including personal medical history, family history, and any available genetic information that may result in a personalized plan for cancer prevention and surveillance.  RECOMMENDATIONS FOR FAMILY MEMBERS:  Women in this family might be at some increased risk of developing cancer, over the general population risk, simply due to the family history of cancer.  We recommended women in this family have a yearly mammogram beginning at age 47, or 26 years younger than the earliest onset of cancer, an annual clinical breast exam, and perform monthly breast self-exams. Women in this family should also have a gynecological exam as recommended by their primary provider. All family  members should have a colonoscopy by age 71.  FOLLOW-UP: Lastly, we discussed with Ms. Kovalcik that cancer genetics is a rapidly advancing field and it is possible that new genetic tests will be appropriate for her and/or her family members in the future. We encouraged her to remain in contact with cancer genetics on an annual  basis so we can update her personal and family histories and let her know of advances in cancer genetics that may benefit this family.   Our contact number was provided. Ms. Molla questions were answered to her satisfaction, and she knows she is welcome to call us at anytime with additional questions or concerns.   Roma Kayser, MS, [facility] Certified Genetic Counselor [email or web address]

## 2018-06-05 DIAGNOSIS — H04123 Dry eye syndrome of bilateral lacrimal glands: Secondary | ICD-10-CM | POA: Diagnosis not present

## 2018-06-05 DIAGNOSIS — H11829 Conjunctivochalasis, unspecified eye: Secondary | ICD-10-CM | POA: Diagnosis not present

## 2018-06-05 DIAGNOSIS — H1013 Acute atopic conjunctivitis, bilateral: Secondary | ICD-10-CM | POA: Diagnosis not present

## 2018-07-30 ENCOUNTER — Ambulatory Visit: Payer: PPO | Admitting: Family Medicine

## 2018-10-18 ENCOUNTER — Encounter (INDEPENDENT_AMBULATORY_CARE_PROVIDER_SITE_OTHER): Payer: Self-pay | Admitting: Family Medicine

## 2018-10-18 ENCOUNTER — Ambulatory Visit (INDEPENDENT_AMBULATORY_CARE_PROVIDER_SITE_OTHER): Payer: Medicare HMO | Admitting: Family Medicine

## 2018-10-18 DIAGNOSIS — M1711 Unilateral primary osteoarthritis, right knee: Secondary | ICD-10-CM | POA: Diagnosis not present

## 2018-10-18 MED ORDER — LIDOCAINE HCL 1 % IJ SOLN
3.0000 mL | INTRAMUSCULAR | Status: AC | PRN
  Administered 2018-10-18: 3 mL

## 2018-10-18 MED ORDER — ETODOLAC 400 MG PO TABS: 400.0000 mg | ORAL_TABLET | Freq: Two times a day (BID) | ORAL | 3 refills | Status: DC | PRN

## 2018-10-18 MED ORDER — METHYLPREDNISOLONE ACETATE 40 MG/ML IJ SUSP
40.0000 mg | INTRAMUSCULAR | Status: AC | PRN
  Administered 2018-10-18: 40 mg via INTRA_ARTICULAR

## 2018-10-18 NOTE — Progress Notes (Addendum)
Office Visit Note   Patient: Darlene [name]           Date of Birth: 01/25/1951           MRN: [id number] Visit Date: 10/18/2018 Requested by: Martinique, Betty G, MD [address] [place], [place] [address] PCP: Martinique, Betty G, MD  Subjective: Chief Complaint  Patient presents with  . Right Knee - Pain    Right knee was trying to " lock up" last night. Monovisc injection was not much help (had in bilateral knees).  . Left Knee - Pain    HPI: She is here with worsening right knee pain.  History of end-stage medial compartment DJD in both knees, right one has bothered her more than the left.  She has not gotten much relief from injections.  She is trying to lose weight in order to proceed with knee replacement.  Recently while walking, her knee suddenly would not move.  She felt like it is going to give out.  Now it is moving a little better but it is very painful.              ROS: Otherwise noncontributory  Objective: Vital Signs: There were no vitals taken for this visit.  Physical Exam:  Right knee: 1+ effusion, no warmth or erythema.  Very tender on the medial joint line.  She has 1+ laxity with valgus stress but a solid endpoint.  No palpable click with McMurray's.  Imaging: None today.  Assessment & Plan: 1.  End-stage right knee medial compartment DJD -Discussed options with her, she wants to try another cortisone injection for immediate symptom relief.  We will try Lodine for inflammation.  We will also try a medial compartment unloading brace.  Probably needs a custom brace due to disproportionate calf/thigh size.  She will continue working on weight loss.   Follow-Up Instructions: No follow-ups on file.      Procedures: Large Joint Inj on 10/18/2018 11:53 AM Indications: pain and joint swelling Details: 25 G 1.5 in needle, superolateral approach Medications: 3 mL lidocaine 1 %; 40 mg methylPREDNISolone acetate 40 MG/ML Aspirate: clear and  yellow Consent was given by the patient.      No notes on file    PMFS History: Patient Active Problem List   Diagnosis Date Noted  . Genetic testing 04/02/2018  . Family history of breast cancer   . Family history of prostate cancer   . Family history of kidney cancer   . Morbid obesity with BMI of 40.0-44.9, adult ([place]) 02/26/2018  . Bacterial vaginosis 05/08/2017  . Dizziness 01/30/2017  . BPPV (benign paroxysmal positional vertigo), right 01/30/2017  . Prediabetes 11/30/2015  . Osteopenia 07/05/2015  . Healthcare maintenance 10/04/2011  . Osteoarthritis of both knees 12/26/2007  . Dyslipidemia 09/30/2006  . OBESITY NOS 09/30/2006  . Essential hypertension 07/31/2006   Past Medical History:  Diagnosis Date  . Elevated LFTs    history of, normal abdominal ultrasound, LFT's normal 01/2007  . Family history of breast cancer   . Family history of kidney cancer   . Family history of prostate cancer   . History of chest pain    rulre out MI in 12/2006.  Marland Kitchen Hyperlipidemia   . Hypertension   . Obesity   . Osteoarthritis of both knees 12/26/2007   Xray of knee 10/2007: Bilateral osteoarthritis, sent to PT but did not complete tx course.    . Osteopenia 07/05/2015  . Prediabetes 11/30/2015  Family History  Problem Relation Age of Onset  . Heart disease Mother   . Kidney cancer Father   . Cancer Sister   . Breast cancer Sister 18       PALB2+  . Heart attack Brother   . Prostate cancer Brother 75  . Cancer Paternal Aunt        NOS  . Prostate cancer Paternal Uncle   . Breast cancer Sister 42  . Breast cancer Sister 28    Past Surgical History:  Procedure Laterality Date  . ABDOMINAL HYSTERECTOMY  1985   partial  . APPENDECTOMY  1985  . CHOLECYSTECTOMY  1985   Social History   Occupational History  . Not on file  Tobacco Use  . Smoking status: Never Smoker  . Smokeless tobacco: Never Used  Substance and Sexual Activity  . Alcohol use: No  . Drug use: No  .  Sexual activity: Never

## 2018-10-29 ENCOUNTER — Other Ambulatory Visit: Payer: Self-pay | Admitting: Internal Medicine

## 2018-10-29 DIAGNOSIS — I10 Essential (primary) hypertension: Secondary | ICD-10-CM

## 2018-11-21 ENCOUNTER — Telehealth (INDEPENDENT_AMBULATORY_CARE_PROVIDER_SITE_OTHER): Payer: Self-pay | Admitting: Family Medicine

## 2018-11-21 NOTE — Telephone Encounter (Signed)
10/18/18 ov note emailed to [facility] w/ [facility]

## 2018-12-20 ENCOUNTER — Telehealth (INDEPENDENT_AMBULATORY_CARE_PROVIDER_SITE_OTHER): Payer: Self-pay | Admitting: Family Medicine

## 2018-12-20 NOTE — Telephone Encounter (Signed)
Patient called left voicemail message needing to contact Macon Large concerning a knee brace. Patient wanted to know if she has been approved for the knee brace? The number to contact patient is [phone]

## 2018-12-21 NOTE — Telephone Encounter (Signed)
I spoke with the patient, who had a different phone number for [facility]. She has been measured for the brace but has not received it yet.  I called Samuel Bouche: the brace was approved by the insurance company on 11/29/2018 and [employer] tried to deliver the brace to the patient several times. Because she lives in an apartment, [employer] will not just leave a package if no one is there to sign for it. [employer] then sent it back to [facility] on 12/13/2018. He did not realize this had happened until this phone call today. He said he will call the patient about this. I did tell him I would have to know when he would like to meet the patient in the office ahead of time, so that I can get special approval from the office manager (we have not been admitting any reps due to covid-19).

## 2018-12-21 NOTE — Telephone Encounter (Signed)
I called Darlene [name] to check on the progress of this.  I reached his voice mail - asked him to call me back. I tried calling the patient - no answer and her voice mail has not been set up yet.

## 2019-03-21 ENCOUNTER — Telehealth: Payer: PPO | Admitting: Family Medicine

## 2019-06-04 ENCOUNTER — Ambulatory Visit (INDEPENDENT_AMBULATORY_CARE_PROVIDER_SITE_OTHER): Payer: Medicare HMO | Admitting: Family Medicine

## 2019-06-04 ENCOUNTER — Encounter (INDEPENDENT_AMBULATORY_CARE_PROVIDER_SITE_OTHER): Payer: Self-pay | Admitting: Family Medicine

## 2019-06-04 ENCOUNTER — Other Ambulatory Visit: Payer: Self-pay

## 2019-06-04 ENCOUNTER — Encounter: Payer: Self-pay | Admitting: Family Medicine

## 2019-06-04 VITALS — BP 118/75 | HR 63 | Temp 97.4°F | Ht 59.0 in | Wt 213.0 lb

## 2019-06-04 DIAGNOSIS — R5383 Other fatigue: Secondary | ICD-10-CM | POA: Diagnosis not present

## 2019-06-04 DIAGNOSIS — R739 Hyperglycemia, unspecified: Secondary | ICD-10-CM

## 2019-06-04 DIAGNOSIS — E559 Vitamin D deficiency, unspecified: Secondary | ICD-10-CM

## 2019-06-04 DIAGNOSIS — E7849 Other hyperlipidemia: Secondary | ICD-10-CM

## 2019-06-04 DIAGNOSIS — E538 Deficiency of other specified B group vitamins: Secondary | ICD-10-CM

## 2019-06-04 DIAGNOSIS — Z6841 Body Mass Index (BMI) 40.0 and over, adult: Secondary | ICD-10-CM | POA: Diagnosis not present

## 2019-06-04 DIAGNOSIS — R0602 Shortness of breath: Secondary | ICD-10-CM

## 2019-06-04 DIAGNOSIS — I10 Essential (primary) hypertension: Secondary | ICD-10-CM

## 2019-06-04 DIAGNOSIS — Z0289 Encounter for other administrative examinations: Secondary | ICD-10-CM

## 2019-06-04 DIAGNOSIS — Z1331 Encounter for screening for depression: Secondary | ICD-10-CM | POA: Diagnosis not present

## 2019-06-04 NOTE — Progress Notes (Signed)
.  Office: [phone]  /  Fax: [phone]   HPI:   Chief Complaint: OBESITY  Darlene [name] (MR# [id number]) is a 68 y.o. female who presents on 06/04/2019 for obesity evaluation and treatment. Current BMI is Body mass index is 43.02 kg/m.Darlene [name] has struggled with obesity for years and has been unsuccessful in either losing weight or maintaining long term weight loss. Darlene [name] attended our information session and states she is currently in the action stage of change and ready to dedicate time achieving and maintaining a healthier weight.  Darlene [name] states her family eats meals together she thinks her family will eat healthier with  her her desired weight loss is 63 lbs she started gaining weight in 1997 her heaviest weight ever was 225 lbs. she is trying to eat vegetarian she sometimes eats larger portions than normal  she struggles with emotional eating   Darlene [name] has been trying to eat plant based and has been supplementing with shakes/bars and has not lost weight. She is eating a fair amount of healthier but higher calorie foods.   Fatigue Darlene [name] feels her energy is lower than it should be. This has worsened with weight gain and has worsened recently. Darlene [name] admits to daytime somnolence. Patient is at risk for obstructive sleep apnea. Patent has a history of symptoms of daytime fatigue. Patient generally gets 4-5 hours of sleep per night, and states they generally have do not have restful sleep. Snoring is present. Apneic episodes are not present. Epworth Sleepiness Score is 5.  Dyspnea on exertion Darlene [name] notes increasing shortness of breath with activity but her joint pain often limits her activity so shortness of air isn't as obvious. She notes getting out of breath sooner with activity than she used to. This has not gotten worse recently. Darlene [name] denies orthopnea.  Hyperglycemia Darlene [name] has a history of some elevated blood glucose readings and A1c levels without a  diagnosis of diabetes. She is on no medication and would like to try to diet control.  Hyperlipidemia Darlene [name] has hyperlipidemia and is not on a statin. She has been trying to improve her cholesterol levels with intensive lifestyle modification including a low saturated fat diet, exercise and weight loss. She denies any chest pain. She would like to try to diet control.  Vitamin D deficiency Darlene [name] has a diagnosis of Vitamin D deficiency. She is currently taking OTC Vit D and denies nausea, vomiting or muscle weakness but does admit to fatigue.  Vitamin B12 deficiency Darlene [name] has a diagnosis of B12 insufficiency and is on OTC B12. She does admit to ongoing fatigue.   Hypertension Darlene [name] is a 68 y.o. female with hypertension and is stable on HCTZ.  Darlene [name] denies chest pain or lightheadedness. She would like to control with diet.   Depression Screen Darlene [name] Food and Mood (modified PHQ-9) score was 4. Depression screen Darlene [name] 2/9 06/04/2019  Decreased Interest 0  Down, Depressed, Hopeless 1  PHQ - 2 Score 1  Altered sleeping 1  Tired, decreased energy 1  Change in appetite 1  Feeling bad or failure about yourself  0  Trouble concentrating 0  Moving slowly or fidgety/restless 0  Suicidal thoughts 0  PHQ-9 Score 4  Difficult doing work/chores Not difficult at all    ASSESSMENT AND PLAN:  Other fatigue - Plan: EKG 12-Lead  Shortness of breath on exertion  Hyperglycemia  Other hyperlipidemia  Vitamin D deficiency  B12 nutritional deficiency  Essential hypertension  Depression screening  Class  3 severe obesity with serious comorbidity and body mass index (BMI) of 40.0 to 44.9 in adult, unspecified obesity type (Darlene [name])  PLAN:  Fatigue Darlene [name] was informed that her fatigue may be related to obesity, depression or many other causes. Labs will be ordered, and in the meanwhile Darlene [name] has agreed to work on diet, exercise and weight loss to help with  fatigue. Proper sleep hygiene was discussed including the need for 7-8 hours of quality sleep each night. A sleep study was not ordered based on symptoms and Epworth score.  Dyspnea on exertion Darlene [name]'s shortness of breath appears to be obesity related and exercise induced. She has agreed to work on weight loss and gradually increase exercise to treat her exercise induced shortness of breath. If Darlene [name] follows our instructions and loses weight without improvement of her shortness of breath, we will plan to refer to pulmonology. We will monitor this condition regularly. Darlene [name] agrees to this plan.  Hyperglycemia Fasting labs will be obtained and results with be discussed with Darlene [name] in 2 weeks at her follow up visit. In the meanwhile Darlene [name] will have labs checked today today. She was started on a lower simple carbohydrate diet and will work on weight loss efforts.  Hyperlipidemia Darlene [name] was informed of the American Heart Association Guidelines emphasizing intensive lifestyle modifications as the first line treatment for hyperlipidemia. We discussed many lifestyle modifications today in depth, and Darlene [name] will continue to work on decreasing saturated fats such as fatty red meat, butter and many fried foods. Darlene [name] will have labs checked and start on a diet prescription. She will also increase vegetables and lean protein in her diet and continue to work on exercise and weight loss efforts.  Vitamin D Deficiency Darlene [name] was informed that low Vitamin D levels contributes to fatigue and are associated with obesity, breast, and colon cancer. She will have routine testing of Vitamin D today and will follow-up with our clinic in 2 weeks.  Vitamin B12 Deficiency Darlene [name] will work on increasing B12 rich foods in her diet. She will have labs checked today and will follow-up as directed.  Hypertension We discussed sodium restriction, working on healthy weight loss, and a regular exercise program as the  means to achieve improved blood pressure control. Darlene [name] agreed with this plan and agreed to follow up as directed. We will continue to monitor her blood pressure as well as her progress with the above lifestyle modifications. She will start her diet prescription and watch for signs of hypotension as she continues her lifestyle modifications.  Depression Screen Darlene [name] had a negative depression screening. Depression is commonly associated with obesity and often results in emotional eating behaviors. We will monitor this closely and work on CBT to help improve the non-hunger eating patterns.   Obesity Darlene [name] is currently in the action stage of change and her goal is to continue with weight loss efforts. She has agreed to follow the Pescatarian eating plan. Darlene [name] has been instructed to work up to a goal of 150 minutes of combined cardio and strengthening exercise per week for weight loss and overall health benefits. We discussed the following Behavioral Modification Strategies today: increasing lean protein intake and decreasing simple carbohydrates.   Darlene [name] has agreed to follow-up with our clinic in 2 weeks. She was informed of the importance of frequent follow-up visits to maximize her success with intensive lifestyle modifications for her multiple health conditions. She was informed we would discuss her lab results at her next visit unless there is a  critical issue that needs to be addressed sooner. Darlene [name] agreed to keep her next visit at the agreed upon time to discuss these results.  ALLERGIES: Allergies  Allergen Reactions  . Codeine   . Propoxyphene N-Acetaminophen     REACTION: "makes me sick"    MEDICATIONS: Current Outpatient Medications on File Prior to Visit  Medication Sig Dispense Refill  . CALCIUM-MAGNESIUM-VITAMIN D PO Take 1 tablet by mouth daily.    . hydrochlorothiazide (HYDRODIURIL) 12.5 MG tablet TAKE 1 TABLET(12.5 MG) BY MOUTH DAILY 90 tablet 2   No current  facility-administered medications on file prior to visit.     PAST MEDICAL HISTORY: Past Medical History:  Diagnosis Date  . Constipation   . Depression   . Elevated LFTs    history of, normal abdominal ultrasound, LFT's normal 01/2007  . Family history of breast cancer   . Family history of kidney cancer   . Family history of prostate cancer   . Gallbladder problem   . History of chest pain    rulre out MI in 12/2006.  Marland Kitchen Hyperlipidemia   . Hypertension   . Lower extremity edema   . Obesity   . Osteoarthritis of both knees 12/26/2007   Xray of knee 10/2007: Bilateral osteoarthritis, sent to PT but did not complete tx course.    . Osteopenia 07/05/2015  . Prediabetes 11/30/2015    PAST SURGICAL HISTORY: Past Surgical History:  Procedure Laterality Date  . ABDOMINAL HYSTERECTOMY  1985   partial  . APPENDECTOMY  1985  . CHOLECYSTECTOMY  1985    SOCIAL HISTORY: Social History   Tobacco Use  . Smoking status: Never Smoker  . Smokeless tobacco: Never Used  Substance Use Topics  . Alcohol use: No  . Drug use: No    FAMILY HISTORY: Family History  Problem Relation Age of Onset  . Heart disease Mother   . Diabetes Mother   . Hypertension Mother   . Kidney cancer Father   . Cancer Sister   . Breast cancer Sister 60       PALB2+  . Heart attack Brother   . Prostate cancer Brother 22  . Cancer Paternal Aunt        NOS  . Prostate cancer Paternal Uncle   . Breast cancer Sister 48  . Breast cancer Sister 1   ROS: Review of Systems  Constitutional: Positive for malaise/fatigue.  HENT: Positive for sinus pain.   Eyes: Positive for blurred vision and double vision.       Positive for floaters.  Respiratory: Positive for shortness of breath (with activity).   Cardiovascular: Positive for palpitations. Negative for chest pain and orthopnea.  Gastrointestinal: Positive for constipation and diarrhea. Negative for nausea and vomiting.  Genitourinary:       Positive for  polyuria.  Musculoskeletal:       Negative for muscle weakness. Positive for calf/leg pain with walking.  Skin: Positive for itching.  Neurological: Positive for sensory change (very cold feet or hands) and headaches.       Negative for lightheadedness. Positive for leg cramping.  Endo/Heme/Allergies:       Positive for heat/cold intolerance.  Psychiatric/Behavioral: The patient has insomnia.    PHYSICAL EXAM: Blood pressure 118/75, pulse 63, temperature (!) 97.4 F (36.3 C), temperature source Oral, height _0  (1.499 m), weight 213 lb (96.6 kg), SpO2 98 %. Body mass index is 43.02 kg/m. Physical Exam Vitals signs reviewed.  Constitutional:  Appearance: Normal appearance. She is well-developed. She is obese.  HENT:     Head: Normocephalic and atraumatic.     Nose: Nose normal.  Eyes:     General: No scleral icterus. Neck:     Musculoskeletal: Normal range of motion.  Cardiovascular:     Rate and Rhythm: Normal rate and regular rhythm.  Pulmonary:     Effort: Pulmonary effort is normal. No respiratory distress.  Abdominal:     Palpations: Abdomen is soft.     Tenderness: There is no abdominal tenderness.  Musculoskeletal: Normal range of motion.     Comments: Range of motion normal in all four extremities.  Skin:    General: Skin is warm and dry.  Neurological:     Mental Status: She is alert and oriented to person, place, and time.     Coordination: Coordination normal.  Psychiatric:        Mood and Affect: Mood and affect normal.        Behavior: Behavior normal.   RECENT LABS AND TESTS: BMET    Component Value Date/Time   NA 141 03/01/2018 0913   NA 142 10/28/2016 1511   K 4.8 03/01/2018 0913   CL 105 03/01/2018 0913   CO2 27 03/01/2018 0913   GLUCOSE 99 03/01/2018 0913   BUN 18 03/01/2018 0913   BUN 14 10/28/2016 1511   CREATININE 0.77 03/01/2018 0913   CREATININE 0.87 12/22/2014 1656   CALCIUM 9.6 03/01/2018 0913   GFRNONAA 80 10/28/2016 1511    GFRNONAA 72 12/22/2014 1656   GFRAA 92 10/28/2016 1511   GFRAA 83 12/22/2014 1656   Lab Results  Component Value Date   HGBA1C 5.9 03/01/2018   No results found for: INSULIN CBC    Component Value Date/Time   WBC 6.5 12/30/2015 1405   WBC 8.1 12/22/2014 1656   RBC 4.61 12/30/2015 1405   RBC 4.93 12/22/2014 1656   HGB 12.4 12/30/2015 1405   HCT 38.6 12/30/2015 1405   PLT 352 12/30/2015 1405   MCV 84 12/30/2015 1405   MCH 26.9 12/30/2015 1405   MCH 27.0 12/22/2014 1656   MCHC 32.1 12/30/2015 1405   MCHC 32.8 12/22/2014 1656   RDW 14.1 12/30/2015 1405   LYMPHSABS 3.9 12/22/2014 1656   MONOABS 0.6 12/22/2014 1656   EOSABS 0.2 12/22/2014 1656   BASOSABS 0.1 12/22/2014 1656   Iron/TIBC/Ferritin/ %Sat No results found for: IRON, TIBC, FERRITIN, IRONPCTSAT Lipid Panel     Component Value Date/Time   CHOL 200 03/01/2018 0913   CHOL 214 (H) 10/28/2016 1511   TRIG 96.0 03/01/2018 0913   HDL 67.90 03/01/2018 0913   HDL 72 10/28/2016 1511   CHOLHDL 3 03/01/2018 0913   VLDL 19.2 03/01/2018 0913   LDLCALC 113 (H) 03/01/2018 0913   LDLCALC 122 (H) 10/28/2016 1511   Hepatic Function Panel     Component Value Date/Time   PROT 7.3 12/22/2014 1656   ALBUMIN 4.3 12/22/2014 1656   AST 17 12/22/2014 1656   ALT 11 12/22/2014 1656   ALKPHOS 109 12/22/2014 1656   BILITOT 0.3 12/22/2014 1656      Component Value Date/Time   TSH 1.295 10/21/2010 2048   No results found for: Vitamin D, 25-Hydroxy  ECG shows sinus rhythm with a rate of 67 BPM, left axis anterior fascicular block. Nonspecific T-abnormality. Abnormal.  INDIRECT CALORIMETER done today shows a VO2 of 195 and a REE of 1357. Her calculated basal metabolic rate is 3614 thus her  basal metabolic rate is worse than expected.  OBESITY BEHAVIORAL INTERVENTION VISIT  Today's visit was #1   Starting weight: 213 lbs Starting date: 06/04/2019 Today's weight: 213 lbs Today's date: 06/04/2019 Total lbs lost to date: 0 At  least 15 minutes were spent on discussing the following behavioral intervention visit.    06/04/2019  Height _0  (1.499 m)  Weight 213 lb (96.6 kg)  BMI (Calculated) 43  BLOOD PRESSURE - SYSTOLIC 694  BLOOD PRESSURE - DIASTOLIC 75  Waist Measurement  41 inches   Body Fat % 51.3 %  Total Body Water (lbs) 69.4 lbs  RMR 1357   ASK: We discussed the diagnosis of obesity with Darlene Labs Stoecker today and Melisia agreed to give [place] permission to discuss obesity behavioral modification therapy today.  ASSESS: Dacoda has the diagnosis of obesity and her BMI today is 43.0. Kyrsten is in the action stage of change.   ADVISE: Darlene [name] was educated on the multiple health risks of obesity as well as the benefit of weight loss to improve her health. She was advised of the need for long term treatment and the importance of lifestyle modifications to improve her current health and to decrease her risk of future health problems.  AGREE: Multiple dietary modification options and treatment options were discussed and  Brigid agreed to follow the recommendations documented in the above note.  ARRANGE: Jodell was educated on the importance of frequent visits to treat obesity as outlined per CMS and USPSTF guidelines and agreed to schedule her next follow up appointment today.  I, Michaelene Song, am acting as Location manager for Dennard Nip, MD   I have reviewed the above documentation for accuracy and completeness, and I agree with the above. -Dennard Nip, MD

## 2019-06-05 LAB — LIPID PANEL WITH LDL/HDL RATIO
Cholesterol, Total: 186 mg/dL (ref 100–199)
HDL: 64 mg/dL (ref >39)
LDL Chol Calc (NIH): 101 mg/dL — ABNORMAL HIGH (ref 0–99)
LDL/HDL Ratio: 1.6 ratio (ref 0.0–3.2)
Triglycerides: 120 mg/dL (ref 0–149)
VLDL Cholesterol Cal: 21 mg/dL (ref 5–40)

## 2019-06-05 LAB — T4, FREE: Free T4: 1.35 ng/dL (ref 0.82–1.77)

## 2019-06-05 LAB — CBC WITH DIFFERENTIAL
Basophils Absolute: 0.1 10*3/uL (ref 0.0–0.2)
Basos: 1 %
EOS (ABSOLUTE): 0.1 10*3/uL (ref 0.0–0.4)
Eos: 1 %
Hematocrit: 40.4 % (ref 34.0–46.6)
Hemoglobin: 12.7 g/dL (ref 11.1–15.9)
Immature Grans (Abs): 0 10*3/uL (ref 0.0–0.1)
Immature Granulocytes: 0 %
Lymphocytes Absolute: 2.9 10*3/uL (ref 0.7–3.1)
Lymphs: 39 %
MCH: 26.1 pg — ABNORMAL LOW (ref 26.6–33.0)
MCHC: 31.4 g/dL — ABNORMAL LOW (ref 31.5–35.7)
MCV: 83 fL (ref 79–97)
Monocytes Absolute: 0.6 10*3/uL (ref 0.1–0.9)
Monocytes: 8 %
Neutrophils Absolute: 3.7 10*3/uL (ref 1.4–7.0)
Neutrophils: 51 %
RBC: 4.86 x10E6/uL (ref 3.77–5.28)
RDW: 13.9 % (ref 11.7–15.4)
WBC: 7.3 10*3/uL (ref 3.4–10.8)

## 2019-06-05 LAB — COMPREHENSIVE METABOLIC PANEL
ALT: 13 IU/L (ref 0–32)
AST: 19 IU/L (ref 0–40)
Albumin/Globulin Ratio: 1.9 (ref 1.2–2.2)
Albumin: 4.5 g/dL (ref 3.8–4.8)
Alkaline Phosphatase: 114 IU/L (ref 39–117)
BUN/Creatinine Ratio: 15 (ref 12–28)
BUN: 12 mg/dL (ref 8–27)
Bilirubin Total: 0.3 mg/dL (ref 0.0–1.2)
CO2: 25 mmol/L (ref 20–29)
Calcium: 9.9 mg/dL (ref 8.7–10.3)
Chloride: 98 mmol/L (ref 96–106)
Creatinine, Ser: 0.78 mg/dL (ref 0.57–1.00)
GFR calc Af Amer: 92 mL/min/{1.73_m2} (ref >59)
GFR calc non Af Amer: 79 mL/min/{1.73_m2} (ref >59)
Globulin, Total: 2.4 g/dL (ref 1.5–4.5)
Glucose: 98 mg/dL (ref 65–99)
Potassium: 3.7 mmol/L (ref 3.5–5.2)
Sodium: 139 mmol/L (ref 134–144)
Total Protein: 6.9 g/dL (ref 6.0–8.5)

## 2019-06-05 LAB — HEMOGLOBIN A1C
Est. average glucose Bld gHb Est-mCnc: 123 mg/dL
Hgb A1c MFr Bld: 5.9 % — ABNORMAL HIGH (ref 4.8–5.6)

## 2019-06-05 LAB — INSULIN, RANDOM: INSULIN: 10.7 u[IU]/mL (ref 2.6–24.9)

## 2019-06-05 LAB — T3: T3, Total: 132 ng/dL (ref 71–180)

## 2019-06-05 LAB — VITAMIN D 25 HYDROXY (VIT D DEFICIENCY, FRACTURES): Vit D, 25-Hydroxy: 19.1 ng/mL — ABNORMAL LOW (ref 30.0–100.0)

## 2019-06-05 LAB — TSH: TSH: 1.62 u[IU]/mL (ref 0.450–4.500)

## 2019-06-06 ENCOUNTER — Other Ambulatory Visit: Payer: Self-pay | Admitting: Internal Medicine

## 2019-06-06 ENCOUNTER — Other Ambulatory Visit: Payer: Self-pay | Admitting: Family Medicine

## 2019-06-06 DIAGNOSIS — Z1231 Encounter for screening mammogram for malignant neoplasm of breast: Secondary | ICD-10-CM

## 2019-06-18 ENCOUNTER — Other Ambulatory Visit: Payer: Self-pay

## 2019-06-18 ENCOUNTER — Ambulatory Visit (INDEPENDENT_AMBULATORY_CARE_PROVIDER_SITE_OTHER): Payer: Medicare HMO | Admitting: Family Medicine

## 2019-06-18 ENCOUNTER — Encounter (INDEPENDENT_AMBULATORY_CARE_PROVIDER_SITE_OTHER): Payer: Self-pay | Admitting: Family Medicine

## 2019-06-18 VITALS — BP 112/72 | HR 74 | Temp 97.8°F | Ht 59.0 in | Wt 210.0 lb

## 2019-06-18 DIAGNOSIS — Z6841 Body Mass Index (BMI) 40.0 and over, adult: Secondary | ICD-10-CM

## 2019-06-18 DIAGNOSIS — E559 Vitamin D deficiency, unspecified: Secondary | ICD-10-CM | POA: Diagnosis not present

## 2019-06-18 DIAGNOSIS — R7303 Prediabetes: Secondary | ICD-10-CM | POA: Diagnosis not present

## 2019-06-18 MED ORDER — VITAMIN D (ERGOCALCIFEROL) 1.25 MG (50000 UNIT) PO CAPS: 50000.0000 [IU] | ORAL_CAPSULE | ORAL | 0 refills | Status: DC

## 2019-06-19 NOTE — Progress Notes (Signed)
Office: [phone]  /  Fax: [phone]   HPI:   Chief Complaint: OBESITY Darlene [name] is here to discuss her progress with her obesity treatment plan. She is on the  follow the Pescatarian eating plan and is following her eating plan approximately 80% of the time. She states she is strengthening her upper body and legs 15 minutes 3 times per week. Darlene [name] has done well with weight loss on her plan. She states she struggled to eat all of her food. Her weight is 210 lb (95.3 kg) today and has had a weight loss of 3 pounds over a period of 2 weeks since her last visit. She has lost 3 lbs since starting treatment with [place].  Vitamin D deficiency Darlene [name] has a new diagnosis of Vitamin D deficiency. She is currently not taking Vit D and denies nausea, vomiting, or muscle weakness but does admit to fatigue.  Pre-Diabetes Darlene [name] has a diagnosis of prediabetes based on her elevated Hgb A1c and was informed this puts her at greater risk of developing diabetes. She has elevated A1c and insulin and reports increased polyphagia at times, especially in the p.m. She states she has traditionally eaten a high carbohydrate diet. She denies nausea or hypoglycemia.  ASSESSMENT AND PLAN:  Vitamin D deficiency - Plan: Vitamin D, Ergocalciferol, (DRISDOL) 1.25 MG (50000 UT) CAPS capsule  Prediabetes  Class 3 severe obesity with serious comorbidity and body mass index (BMI) of 40.0 to 44.9 in adult, unspecified obesity type ([place])  PLAN:  Vitamin D Deficiency Darlene [name] was informed that low Vitamin D levels contributes to fatigue and are associated with obesity, breast, and colon cancer. She will start prescription Vit D @ 50,000 IU every week #4 with 0 refills and will follow-up for routine testing of Vitamin D, at least 2-3 times per year. She was informed of the risk of over-replacement of Vitamin D and agrees to not increase her dose unless she discusses this with [place] first. Darlene [name] agrees to follow-up with our  clinic in 2 weeks.  Pre-Diabetes Darlene [name] will continue to work on weight loss, exercise, and decreasing simple carbohydrates in her diet to help decrease the risk of diabetes. We dicussed metformin including benefits and risks. She was informed that eating too many simple carbohydrates or too many calories at one sitting increases the likelihood of GI side effects. Darlene [name] will continue diet and exercise and have labs rechecked in 3 months. She will follow-up with [place] as directed to monitor her progress.  Obesity Darlene [name] is currently in the action stage of change. As such, her goal is to continue with weight loss efforts. She has agreed to follow the Pescatarian eating plan. Darlene [name] has been instructed to work up to a goal of 150 minutes of combined cardio and strengthening exercise per week for weight loss and overall health benefits. We discussed the following Behavioral Modification Strategies today: increasing lean protein intake and decreasing simple carbohydrates.   Darlene [name] has agreed to follow-up with our clinic in 2 weeks. She was informed of the importance of frequent follow-up visits to maximize her success with intensive lifestyle modifications for her multiple health conditions.  ALLERGIES: Allergies  Allergen Reactions  . Codeine   . Propoxyphene N-Acetaminophen     REACTION: "makes me sick"    MEDICATIONS: Current Outpatient Medications on File Prior to Visit  Medication Sig Dispense Refill  . Calcium 600-200 MG-UNIT tablet Take 1 tablet by mouth daily.    . Cyanocobalamin (VITAMIN B 12) 500 MCG TABS  Take 1 tablet by mouth daily.    . hydrochlorothiazide (HYDRODIURIL) 12.5 MG tablet TAKE 1 TABLET(12.5 MG) BY MOUTH DAILY 90 tablet 2  . Magnesium 400 MG TABS Take 1 tablet by mouth daily.     No current facility-administered medications on file prior to visit.     PAST MEDICAL HISTORY: Past Medical History:  Diagnosis Date  . Constipation   . Depression   . Elevated  LFTs    history of, normal abdominal ultrasound, LFT's normal 01/2007  . Family history of breast cancer   . Family history of kidney cancer   . Family history of prostate cancer   . Gallbladder problem   . History of chest pain    rulre out MI in 12/2006.  Marland Kitchen Hyperlipidemia   . Hypertension   . Lower extremity edema   . Obesity   . Osteoarthritis of both knees 12/26/2007   Xray of knee 10/2007: Bilateral osteoarthritis, sent to PT but did not complete tx course.    . Osteopenia 07/05/2015  . Prediabetes 11/30/2015    PAST SURGICAL HISTORY: Past Surgical History:  Procedure Laterality Date  . ABDOMINAL HYSTERECTOMY  1985   partial  . APPENDECTOMY  1985  . CHOLECYSTECTOMY  1985    SOCIAL HISTORY: Social History   Tobacco Use  . Smoking status: Never Smoker  . Smokeless tobacco: Never Used  Substance Use Topics  . Alcohol use: No  . Drug use: No    FAMILY HISTORY: Family History  Problem Relation Age of Onset  . Heart disease Mother   . Diabetes Mother   . Hypertension Mother   . Kidney cancer Father   . Cancer Sister   . Breast cancer Sister 5       PALB2+  . Heart attack Brother   . Prostate cancer Brother 69  . Cancer Paternal Aunt        NOS  . Prostate cancer Paternal Uncle   . Breast cancer Sister 44  . Breast cancer Sister 41   ROS: Review of Systems  Constitutional: Positive for malaise/fatigue.  Gastrointestinal: Negative for nausea and vomiting.  Musculoskeletal:       Negative for muscle weakness.  Endo/Heme/Allergies:       Negative for hypoglycemia. Positive for increased polyphagia at times, especially in the p.m.   PHYSICAL EXAM: Blood pressure 112/72, pulse 74, temperature 97.8 F (36.6 C), temperature source Oral, height _0  (1.499 m), weight 210 lb (95.3 kg), SpO2 99 %. Body mass index is 42.41 kg/m. Physical Exam Vitals signs reviewed.  Constitutional:      Appearance: Normal appearance. She is obese.  Cardiovascular:     Rate  and Rhythm: Normal rate.     Pulses: Normal pulses.  Pulmonary:     Effort: Pulmonary effort is normal.     Breath sounds: Normal breath sounds.  Musculoskeletal: Normal range of motion.  Skin:    General: Skin is warm and dry.  Neurological:     Mental Status: She is alert and oriented to person, place, and time.  Psychiatric:        Behavior: Behavior normal.   RECENT LABS AND TESTS: BMET    Component Value Date/Time   NA 139 06/04/2019 0935   K 3.7 06/04/2019 0935   CL 98 06/04/2019 0935   CO2 25 06/04/2019 0935   GLUCOSE 98 06/04/2019 0935   GLUCOSE 99 03/01/2018 0913   BUN 12 06/04/2019 0935   CREATININE 0.78 06/04/2019 0935  CREATININE 0.87 12/22/2014 1656   CALCIUM 9.9 06/04/2019 0935   GFRNONAA 79 06/04/2019 0935   GFRNONAA 72 12/22/2014 1656   GFRAA 92 06/04/2019 0935   GFRAA 83 12/22/2014 1656   Lab Results  Component Value Date   HGBA1C 5.9 (H) 06/04/2019   HGBA1C 5.9 03/01/2018   HGBA1C 5.8 05/08/2017   HGBA1C 5.5 10/28/2016   HGBA1C 5.7 12/30/2015   Lab Results  Component Value Date   INSULIN 10.7 06/04/2019   CBC    Component Value Date/Time   WBC 7.3 06/04/2019 0935   WBC 8.1 12/22/2014 1656   RBC 4.86 06/04/2019 0935   RBC 4.93 12/22/2014 1656   HGB 12.7 06/04/2019 0935   HCT 40.4 06/04/2019 0935   PLT 352 12/30/2015 1405   MCV 83 06/04/2019 0935   MCH 26.1 (L) 06/04/2019 0935   MCH 27.0 12/22/2014 1656   MCHC 31.4 (L) 06/04/2019 0935   MCHC 32.8 12/22/2014 1656   RDW 13.9 06/04/2019 0935   LYMPHSABS 2.9 06/04/2019 0935   MONOABS 0.6 12/22/2014 1656   EOSABS 0.1 06/04/2019 0935   BASOSABS 0.1 06/04/2019 0935   Iron/TIBC/Ferritin/ %Sat No results found for: IRON, TIBC, FERRITIN, IRONPCTSAT Lipid Panel     Component Value Date/Time   CHOL 186 06/04/2019 0935   TRIG 120 06/04/2019 0935   HDL 64 06/04/2019 0935   CHOLHDL 3 03/01/2018 0913   VLDL 19.2 03/01/2018 0913   LDLCALC 113 (H) 03/01/2018 0913   LDLCALC 122 (H) 10/28/2016  1511   Hepatic Function Panel     Component Value Date/Time   PROT 6.9 06/04/2019 0935   ALBUMIN 4.5 06/04/2019 0935   AST 19 06/04/2019 0935   ALT 13 06/04/2019 0935   ALKPHOS 114 06/04/2019 0935   BILITOT 0.3 06/04/2019 0935      Component Value Date/Time   TSH 1.620 06/04/2019 0935   TSH 1.295 10/21/2010 2048   Results for ROSILAND, SEN (MRN [id number]) as of 06/19/2019 14:54  Ref. Range 06/04/2019 09:35  Vitamin D, 25-Hydroxy Latest Ref Range: 30.0 - 100.0 ng/mL 19.1 (L)   OBESITY BEHAVIORAL INTERVENTION VISIT  Today's visit was #2   Starting weight: 213 lbs Starting date: 06/04/2019 Today's weight: 210 lbs  Today's date: 06/18/2019 Total lbs lost to date: 3 At least 15 minutes were spent on discussing the following behavioral intervention visit.    06/18/2019  Height _0  (1.499 m)  Weight 210 lb (95.3 kg)  BMI (Calculated) 42.39  BLOOD PRESSURE - SYSTOLIC 929  BLOOD PRESSURE - DIASTOLIC 72   Body Fat % 24.4 %  Total Body Water (lbs) 69.2 lbs   ASK: We discussed the diagnosis of obesity with Darlene [name] today and Darlene [name] agreed to give [place] permission to discuss obesity behavioral modification therapy today.  ASSESS: Nyx has the diagnosis of obesity and her BMI today is 42.5. Darlene [name] is in the action stage of change.   ADVISE: Darlene [name] was educated on the multiple health risks of obesity as well as the benefit of weight loss to improve her health. She was advised of the need for long term treatment and the importance of lifestyle modifications to improve her current health and to decrease her risk of future health problems.  AGREE: Multiple dietary modification options and treatment options were discussed and  Darlene [name] agreed to follow the recommendations documented in the above note.  ARRANGE: Darlene [name] was educated on the importance of frequent visits to treat obesity as outlined per CMS and  USPSTF guidelines and agreed to schedule her next  follow up appointment today.  I, Michaelene Song, am acting as Location manager for Dennard Nip, MD  I have reviewed the above documentation for accuracy and completeness, and I agree with the above. -Dennard Nip, MD

## 2019-06-21 ENCOUNTER — Ambulatory Visit: Payer: Medicaid Other

## 2019-07-01 ENCOUNTER — Ambulatory Visit (INDEPENDENT_AMBULATORY_CARE_PROVIDER_SITE_OTHER): Payer: Medicare HMO | Admitting: Family Medicine

## 2019-07-01 ENCOUNTER — Other Ambulatory Visit: Payer: Self-pay

## 2019-07-01 VITALS — BP 119/81 | HR 95 | Temp 98.0°F | Ht 59.0 in | Wt 205.0 lb

## 2019-07-01 DIAGNOSIS — R7303 Prediabetes: Secondary | ICD-10-CM | POA: Diagnosis not present

## 2019-07-01 DIAGNOSIS — Z6841 Body Mass Index (BMI) 40.0 and over, adult: Secondary | ICD-10-CM | POA: Diagnosis not present

## 2019-07-03 ENCOUNTER — Encounter (INDEPENDENT_AMBULATORY_CARE_PROVIDER_SITE_OTHER): Payer: Self-pay | Admitting: Family Medicine

## 2019-07-03 NOTE — Progress Notes (Signed)
Office: [phone]  /  Fax: [phone]   HPI:   Chief Complaint: OBESITY Darlene [name] is here to discuss her progress with her obesity treatment plan. She is on the Pescatarian eating plan and is following her eating plan approximately 80 to 85 % of the time. She states she is doing stretching exercise, squats and leg lifts 30 minutes 3 times per week. Darlene [name] did well with the plan but she is bored with the food. She does not feel she will do well with journaling but she would like more food options. She is motivated to stick to plan because she needs bilateralTKR Her weight is 205 lb (93 kg) today and has had a weight loss of 5 pounds over a period of 2 weeks since her last visit. She has lost 8 lbs since starting treatment with [place].  Pre-Diabetes Darlene [name] has a diagnosis of prediabetes based on her elevated Hgb A1c and was informed this puts her at greater risk of developing diabetes. Her last A1c was at 5.9 on 06/04/19. Darlene [name] is not taking metformin currently and she continues to work on diet and exercise to decrease risk of diabetes. She denies polyphagia.  ASSESSMENT AND PLAN:  Prediabetes  Class 3 severe obesity with serious comorbidity and body mass index (BMI) of 40.0 to 44.9 in adult, unspecified obesity type ([place])  PLAN:  Pre-Diabetes Darlene [name] will continue to work on weight loss, exercise, and decreasing simple carbohydrates in her diet to help decrease the risk of diabetes. She was informed that eating too many simple carbohydrates or too many calories at one sitting increases the likelihood of GI side effects. Darlene [name] will continue with the meal plan and follow up with [place] as directed to monitor her progress.  Obesity Matalyn is currently in the action stage of change. As such, her goal is to continue with weight loss efforts She has agreed to follow the Pescatarian eating plan or our protein rich vegetarian plan with additional breakfast options Darlene [name] will continue her  current exercise regimen for weight loss and overall health benefits. We discussed the following Behavioral Modification Strategies today: planning for success, increasing lean protein intake, decreasing simple carbohydrates and work on meal planning and easy cooking plans Darlene [name] may have tuna at lunch on the vegetarian plan. She may have chili at dinner (recipe provided).   Darlene [name] has agreed to follow up with our clinic in 2 weeks. She was informed of the importance of frequent follow up visits to maximize her success with intensive lifestyle modifications for her multiple health conditions.  ALLERGIES: Allergies  Allergen Reactions  . Codeine   . Propoxyphene N-Acetaminophen     REACTION: "makes me sick"    MEDICATIONS: Current Outpatient Medications on File Prior to Visit  Medication Sig Dispense Refill  . Calcium 600-200 MG-UNIT tablet Take 1 tablet by mouth daily.    . Cyanocobalamin (VITAMIN B 12) 500 MCG TABS Take 1 tablet by mouth daily.    . hydrochlorothiazide (HYDRODIURIL) 12.5 MG tablet TAKE 1 TABLET(12.5 MG) BY MOUTH DAILY 90 tablet 2  . Magnesium 400 MG TABS Take 1 tablet by mouth daily.    . Vitamin D, Ergocalciferol, (DRISDOL) 1.25 MG (50000 UT) CAPS capsule Take 1 capsule (50,000 Units total) by mouth every 7 (seven) days. 4 capsule 0   No current facility-administered medications on file prior to visit.     PAST MEDICAL HISTORY: Past Medical History:  Diagnosis Date  . Constipation   . Depression   . Elevated  LFTs    history of, normal abdominal ultrasound, LFT's normal 01/2007  . Family history of breast cancer   . Family history of kidney cancer   . Family history of prostate cancer   . Gallbladder problem   . History of chest pain    rulre out MI in 12/2006.  Marland Kitchen Hyperlipidemia   . Hypertension   . Lower extremity edema   . Obesity   . Osteoarthritis of both knees 12/26/2007   Xray of knee 10/2007: Bilateral osteoarthritis, sent to PT but did not complete  tx course.    . Osteopenia 07/05/2015  . Prediabetes 11/30/2015    PAST SURGICAL HISTORY: Past Surgical History:  Procedure Laterality Date  . ABDOMINAL HYSTERECTOMY  1985   partial  . APPENDECTOMY  1985  . CHOLECYSTECTOMY  1985    SOCIAL HISTORY: Social History   Tobacco Use  . Smoking status: Never Smoker  . Smokeless tobacco: Never Used  Substance Use Topics  . Alcohol use: No  . Drug use: No    FAMILY HISTORY: Family History  Problem Relation Age of Onset  . Heart disease Mother   . Diabetes Mother   . Hypertension Mother   . Kidney cancer Father   . Cancer Sister   . Breast cancer Sister 23       PALB2+  . Heart attack Brother   . Prostate cancer Brother 66  . Cancer Paternal Aunt        NOS  . Prostate cancer Paternal Uncle   . Breast cancer Sister 29  . Breast cancer Sister 89    ROS: Review of Systems  Constitutional: Positive for weight loss.  Endo/Heme/Allergies:       Negative for polyphagia    PHYSICAL EXAM: Blood pressure 119/81, pulse 95, temperature 98 F (36.7 C), temperature source Oral, height '4\' 11"'  (1.499 m), weight 205 lb (93 kg), SpO2 97 %. Body mass index is 41.4 kg/m. Physical Exam Vitals signs reviewed.  Constitutional:      Appearance: Normal appearance. She is well-developed. She is obese.  Cardiovascular:     Rate and Rhythm: Normal rate.  Pulmonary:     Effort: Pulmonary effort is normal.  Musculoskeletal: Normal range of motion.  Skin:    General: Skin is warm and dry.  Neurological:     Mental Status: She is alert and oriented to person, place, and time.  Psychiatric:        Mood and Affect: Mood normal.        Behavior: Behavior normal.     RECENT LABS AND TESTS: BMET    Component Value Date/Time   NA 139 06/04/2019 0935   K 3.7 06/04/2019 0935   CL 98 06/04/2019 0935   CO2 25 06/04/2019 0935   GLUCOSE 98 06/04/2019 0935   GLUCOSE 99 03/01/2018 0913   BUN 12 06/04/2019 0935   CREATININE 0.78 06/04/2019  0935   CREATININE 0.87 12/22/2014 1656   CALCIUM 9.9 06/04/2019 0935   GFRNONAA 79 06/04/2019 0935   GFRNONAA 72 12/22/2014 1656   GFRAA 92 06/04/2019 0935   GFRAA 83 12/22/2014 1656   Lab Results  Component Value Date   HGBA1C 5.9 (H) 06/04/2019   HGBA1C 5.9 03/01/2018   HGBA1C 5.8 05/08/2017   HGBA1C 5.5 10/28/2016   HGBA1C 5.7 12/30/2015   Lab Results  Component Value Date   INSULIN 10.7 06/04/2019   CBC    Component Value Date/Time   WBC 7.3 06/04/2019 0935  WBC 8.1 12/22/2014 1656   RBC 4.86 06/04/2019 0935   RBC 4.93 12/22/2014 1656   HGB 12.7 06/04/2019 0935   HCT 40.4 06/04/2019 0935   PLT 352 12/30/2015 1405   MCV 83 06/04/2019 0935   MCH 26.1 (L) 06/04/2019 0935   MCH 27.0 12/22/2014 1656   MCHC 31.4 (L) 06/04/2019 0935   MCHC 32.8 12/22/2014 1656   RDW 13.9 06/04/2019 0935   LYMPHSABS 2.9 06/04/2019 0935   MONOABS 0.6 12/22/2014 1656   EOSABS 0.1 06/04/2019 0935   BASOSABS 0.1 06/04/2019 0935   Iron/TIBC/Ferritin/ %Sat No results found for: IRON, TIBC, FERRITIN, IRONPCTSAT Lipid Panel     Component Value Date/Time   CHOL 186 06/04/2019 0935   TRIG 120 06/04/2019 0935   HDL 64 06/04/2019 0935   CHOLHDL 3 03/01/2018 0913   VLDL 19.2 03/01/2018 0913   LDLCALC 101 (H) 06/04/2019 0935   Hepatic Function Panel     Component Value Date/Time   PROT 6.9 06/04/2019 0935   ALBUMIN 4.5 06/04/2019 0935   AST 19 06/04/2019 0935   ALT 13 06/04/2019 0935   ALKPHOS 114 06/04/2019 0935   BILITOT 0.3 06/04/2019 0935      Component Value Date/Time   TSH 1.620 06/04/2019 0935   TSH 1.295 10/21/2010 2048     Ref. Range 06/04/2019 09:35  Vitamin D, 25-Hydroxy Latest Ref Range: 30.0 - 100.0 ng/mL 19.1 (L)    OBESITY BEHAVIORAL INTERVENTION VISIT  Today's visit was # 3   Starting weight: 213 lbs Starting date: 06/04/2019 Today's weight : 205 lbs Today's date: 07/01/2019 Total lbs lost to date: 8    07/01/2019  Height '4\' 11"'  (1.499 m)  Weight 205 lb  (93 kg)  BMI (Calculated) 41.38  BLOOD PRESSURE - SYSTOLIC 606  BLOOD PRESSURE - DIASTOLIC 81   Body Fat % 00.4 %  Total Body Water (lbs) 66.8 lbs    ASK: We discussed the diagnosis of obesity with [facility] today and Jnai agreed to give [place] permission to discuss obesity behavioral modification therapy today.  ASSESS: Quinnley has the diagnosis of obesity and her BMI today is 41.38 Nihal is in the action stage of change   ADVISE: Darlene [name] was educated on the multiple health risks of obesity as well as the benefit of weight loss to improve her health. She was advised of the need for long term treatment and the importance of lifestyle modifications to improve her current health and to decrease her risk of future health problems.  AGREE: Multiple dietary modification options and treatment options were discussed and  Darlene [name] agreed to follow the recommendations documented in the above note.  ARRANGE: Brandey was educated on the importance of frequent visits to treat obesity as outlined per CMS and USPSTF guidelines and agreed to schedule her next follow up appointment today.  I, Doreene Nest, am acting as transcriptionist for [employer], FNP-C  I have reviewed the above documentation for accuracy and completeness, and I agree with the above.  - Trung Wenzl, FNP-C.

## 2019-07-16 ENCOUNTER — Other Ambulatory Visit: Payer: Self-pay

## 2019-07-16 ENCOUNTER — Encounter (INDEPENDENT_AMBULATORY_CARE_PROVIDER_SITE_OTHER): Payer: Self-pay | Admitting: Family Medicine

## 2019-07-16 ENCOUNTER — Ambulatory Visit (INDEPENDENT_AMBULATORY_CARE_PROVIDER_SITE_OTHER): Payer: Medicare HMO | Admitting: Family Medicine

## 2019-07-16 VITALS — BP 112/78 | HR 72 | Temp 98.6°F | Ht 59.0 in | Wt 206.0 lb

## 2019-07-16 DIAGNOSIS — Z6841 Body Mass Index (BMI) 40.0 and over, adult: Secondary | ICD-10-CM | POA: Diagnosis not present

## 2019-07-16 DIAGNOSIS — Z9189 Other specified personal risk factors, not elsewhere classified: Secondary | ICD-10-CM | POA: Diagnosis not present

## 2019-07-16 DIAGNOSIS — R7303 Prediabetes: Secondary | ICD-10-CM | POA: Diagnosis not present

## 2019-07-16 DIAGNOSIS — E559 Vitamin D deficiency, unspecified: Secondary | ICD-10-CM

## 2019-07-16 MED ORDER — VITAMIN D (ERGOCALCIFEROL) 1.25 MG (50000 UNIT) PO CAPS: 50000.0000 [IU] | ORAL_CAPSULE | ORAL | 0 refills | Status: DC

## 2019-07-18 NOTE — Progress Notes (Signed)
Office: [phone]  /  Fax: [phone]   HPI:   Chief Complaint: OBESITY Darlene [name] is here to discuss her progress with her obesity treatment plan. She is on the Pescatarian eating plan and is following her eating plan approximately 80 % of the time. She states she is stretching, and doing squats, back and leg kicks 30 minutes 3 times per week. Darlene [name] has tried the [place] sausage but she did not like that it has so many extra ingredients. She has found that she does not like to eat [place] or beef. Darlene [name] has had hot chocolate a few times. Her weight is 206 lb (93.4 kg) today and has had a weight gain of 1 pound over a period of 2 weeks since her last visit. She has lost 7 lbs since starting treatment with [place].  Vitamin D deficiency Darlene [name] has a diagnosis of vitamin D deficiency. Her last vitamin D level was at 19.1 on 06/04/19 and was not at goal. Darlene [name] is currently taking vit D and she denies nausea, vomiting or muscle weakness.  At risk for osteopenia and osteoporosis Darlene [name] is at higher risk of osteopenia and osteoporosis due to vitamin D deficiency.   Pre-Diabetes Darlene [name] has a diagnosis of prediabetes based on her elevated Hgb A1c and was informed this puts her at greater risk of developing diabetes. Darlene [name] is not taking medications currently and she continues to work on diet and exercise to decrease risk of diabetes. She denies polyphagia and she admits cravings. Lab Results  Component Value Date   HGBA1C 5.9 (H) 06/04/2019    ASSESSMENT AND PLAN:  Vitamin D deficiency - Plan: Vitamin D, Ergocalciferol, (DRISDOL) 1.25 MG (50000 UT) CAPS capsule  Prediabetes  At risk for osteoporosis  Class 3 severe obesity with serious comorbidity and body mass index (BMI) of 40.0 to 44.9 in adult, unspecified obesity type ([place])  PLAN:  Vitamin D Deficiency Darlene [name] was informed that low vitamin D levels contributes to fatigue and are associated with obesity, breast, and colon  cancer. Darlene [name] agrees to continue to take prescription Vit D _0 ,000 IU every week #4 with no refills and she will follow up for routine testing of vitamin D, at least 2-3 times per year. She was informed of the risk of over-replacement of vitamin D and agrees to not increase her dose unless she discusses this with [place] first. Darlene [name] agrees to follow up with our clinic in 2 weeks.  At risk for osteopenia and osteoporosis Darlene [name] was given extended  (15 minutes) osteoporosis prevention counseling today. Darlene [name] is at risk for osteopenia and osteoporosis due to her vitamin D deficiency. She was encouraged to take her vitamin D and follow her higher calcium diet and increase strengthening exercise to help strengthen her bones and decrease her risk of osteopenia and osteoporosis.  Pre-Diabetes Darlene [name] will continue to work on weight loss, exercise, and decreasing simple carbohydrates in her diet to help decrease the risk of diabetes. Darlene [name] will continue with the meal plan and follow up with [place] as directed to monitor her progress.  Obesity Darlene [name] is currently in the action stage of change. As such, her goal is to continue with weight loss efforts She has agreed to follow the Pescatarian eating plan or the protein rich vegetarian plan +100 calories if needed Darlene [name] will add walking a few days a week for weight loss and overall health benefits. We discussed the following Behavioral Modification Strategies today: planning for success, increasing lean protein intake and decreasing simple carbohydrates  Darlene [name] has agreed to follow up with our clinic in 2 weeks. She was informed of the importance of frequent follow up visits to maximize her success with intensive lifestyle modifications for her multiple health conditions.  ALLERGIES: Allergies  Allergen Reactions  . Codeine   . Propoxyphene N-Acetaminophen     REACTION: "makes me sick"    MEDICATIONS: Current Outpatient Medications on File  Prior to Visit  Medication Sig Dispense Refill  . Calcium 600-200 MG-UNIT tablet Take 1 tablet by mouth daily.    . Cyanocobalamin (VITAMIN B 12) 500 MCG TABS Take 1 tablet by mouth daily.    . hydrochlorothiazide (HYDRODIURIL) 12.5 MG tablet TAKE 1 TABLET(12.5 MG) BY MOUTH DAILY 90 tablet 2  . Magnesium 400 MG TABS Take 1 tablet by mouth daily.     No current facility-administered medications on file prior to visit.     PAST MEDICAL HISTORY: Past Medical History:  Diagnosis Date  . Constipation   . Depression   . Elevated LFTs    history of, normal abdominal ultrasound, LFT's normal 01/2007  . Family history of breast cancer   . Family history of kidney cancer   . Family history of prostate cancer   . Gallbladder problem   . History of chest pain    rulre out MI in 12/2006.  Marland Kitchen Hyperlipidemia   . Hypertension   . Lower extremity edema   . Obesity   . Osteoarthritis of both knees 12/26/2007   Xray of knee 10/2007: Bilateral osteoarthritis, sent to PT but did not complete tx course.    . Osteopenia 07/05/2015  . Prediabetes 11/30/2015    PAST SURGICAL HISTORY: Past Surgical History:  Procedure Laterality Date  . ABDOMINAL HYSTERECTOMY  1985   partial  . APPENDECTOMY  1985  . CHOLECYSTECTOMY  1985    SOCIAL HISTORY: Social History   Tobacco Use  . Smoking status: Never Smoker  . Smokeless tobacco: Never Used  Substance Use Topics  . Alcohol use: No  . Drug use: No    FAMILY HISTORY: Family History  Problem Relation Age of Onset  . Heart disease Mother   . Diabetes Mother   . Hypertension Mother   . Kidney cancer Father   . Cancer Sister   . Breast cancer Sister 20       PALB2+  . Heart attack Brother   . Prostate cancer Brother 56  . Cancer Paternal Aunt        NOS  . Prostate cancer Paternal Uncle   . Breast cancer Sister 79  . Breast cancer Sister 43    ROS: Review of Systems  Constitutional: Negative for weight loss.  Gastrointestinal: Negative for  nausea and vomiting.  Musculoskeletal:       Negative for muscle weakness  Endo/Heme/Allergies:       Positive for cravings Negative for polyphagia    PHYSICAL EXAM: Blood pressure 112/78, pulse 72, temperature 98.6 F (37 C), temperature source Oral, height _0  (1.499 m), weight 206 lb (93.4 kg), SpO2 99 %. Body mass index is 41.61 kg/m. Physical Exam Vitals signs reviewed.  Constitutional:      Appearance: Normal appearance. She is well-developed. She is obese.  Cardiovascular:     Rate and Rhythm: Normal rate.  Pulmonary:     Effort: Pulmonary effort is normal.  Musculoskeletal: Normal range of motion.  Skin:    General: Skin is warm and dry.  Neurological:     Mental Status: She is  alert and oriented to person, place, and time.  Psychiatric:        Mood and Affect: Mood normal.        Behavior: Behavior normal.     RECENT LABS AND TESTS: BMET    Component Value Date/Time   NA 139 06/04/2019 0935   K 3.7 06/04/2019 0935   CL 98 06/04/2019 0935   CO2 25 06/04/2019 0935   GLUCOSE 98 06/04/2019 0935   GLUCOSE 99 03/01/2018 0913   BUN 12 06/04/2019 0935   CREATININE 0.78 06/04/2019 0935   CREATININE 0.87 12/22/2014 1656   CALCIUM 9.9 06/04/2019 0935   GFRNONAA 79 06/04/2019 0935   GFRNONAA 72 12/22/2014 1656   GFRAA 92 06/04/2019 0935   GFRAA 83 12/22/2014 1656   Lab Results  Component Value Date   HGBA1C 5.9 (H) 06/04/2019   HGBA1C 5.9 03/01/2018   HGBA1C 5.8 05/08/2017   HGBA1C 5.5 10/28/2016   HGBA1C 5.7 12/30/2015   Lab Results  Component Value Date   INSULIN 10.7 06/04/2019   CBC    Component Value Date/Time   WBC 7.3 06/04/2019 0935   WBC 8.1 12/22/2014 1656   RBC 4.86 06/04/2019 0935   RBC 4.93 12/22/2014 1656   HGB 12.7 06/04/2019 0935   HCT 40.4 06/04/2019 0935   PLT 352 12/30/2015 1405   MCV 83 06/04/2019 0935   MCH 26.1 (L) 06/04/2019 0935   MCH 27.0 12/22/2014 1656   MCHC 31.4 (L) 06/04/2019 0935   MCHC 32.8 12/22/2014 1656    RDW 13.9 06/04/2019 0935   LYMPHSABS 2.9 06/04/2019 0935   MONOABS 0.6 12/22/2014 1656   EOSABS 0.1 06/04/2019 0935   BASOSABS 0.1 06/04/2019 0935   Iron/TIBC/Ferritin/ %Sat No results found for: IRON, TIBC, FERRITIN, IRONPCTSAT Lipid Panel     Component Value Date/Time   CHOL 186 06/04/2019 0935   TRIG 120 06/04/2019 0935   HDL 64 06/04/2019 0935   CHOLHDL 3 03/01/2018 0913   VLDL 19.2 03/01/2018 0913   LDLCALC 101 (H) 06/04/2019 0935   Hepatic Function Panel     Component Value Date/Time   PROT 6.9 06/04/2019 0935   ALBUMIN 4.5 06/04/2019 0935   AST 19 06/04/2019 0935   ALT 13 06/04/2019 0935   ALKPHOS 114 06/04/2019 0935   BILITOT 0.3 06/04/2019 0935      Component Value Date/Time   TSH 1.620 06/04/2019 0935   TSH 1.295 10/21/2010 2048     Ref. Range 06/04/2019 09:35  Vitamin D, 25-Hydroxy Latest Ref Range: 30.0 - 100.0 ng/mL 19.1 (L)    OBESITY BEHAVIORAL INTERVENTION VISIT  Today's visit was # 4   Starting weight: 213 lbs Starting date: 06/04/2019 Today's weight : 206 lbs Today's date: 07/16/2019 Total lbs lost to date: 7    07/16/2019  Height _0  (1.499 m)  Weight 206 lb (93.4 kg)  BMI (Calculated) 41.58  BLOOD PRESSURE - SYSTOLIC 321  BLOOD PRESSURE - DIASTOLIC 78   Body Fat % 22.4 %  Total Body Water (lbs) 67.2 lbs    ASK: We discussed the diagnosis of obesity with Darlene [name] today and Darlene [name] agreed to give [place] permission to discuss obesity behavioral modification therapy today.  ASSESS: Brandilyn has the diagnosis of obesity and her BMI today is 41.58 Darlene [name] is in the action stage of change   ADVISE: Darlene [name] was educated on the multiple health risks of obesity as well as the benefit of weight loss to improve her health. She was advised  of the need for long term treatment and the importance of lifestyle modifications to improve her current health and to decrease her risk of future health problems.  AGREE: Multiple dietary  modification options and treatment options were discussed and  Darlene [name] agreed to follow the recommendations documented in the above note.  ARRANGE: Darlene [name] was educated on the importance of frequent visits to treat obesity as outlined per CMS and USPSTF guidelines and agreed to schedule her next follow up appointment today.  I, Doreene Nest, am acting as transcriptionist for [employer], FNP-C  I have reviewed the above documentation for accuracy and completeness, and I agree with the above.  - Sekai Nayak, FNP-C.

## 2019-07-22 ENCOUNTER — Encounter (INDEPENDENT_AMBULATORY_CARE_PROVIDER_SITE_OTHER): Payer: Self-pay | Admitting: Family Medicine

## 2019-07-22 DIAGNOSIS — E559 Vitamin D deficiency, unspecified: Secondary | ICD-10-CM | POA: Insufficient documentation

## 2019-07-31 ENCOUNTER — Encounter (INDEPENDENT_AMBULATORY_CARE_PROVIDER_SITE_OTHER): Payer: Self-pay | Admitting: Family Medicine

## 2019-07-31 ENCOUNTER — Other Ambulatory Visit: Payer: Self-pay

## 2019-07-31 ENCOUNTER — Ambulatory Visit (INDEPENDENT_AMBULATORY_CARE_PROVIDER_SITE_OTHER): Payer: Medicare HMO | Admitting: Family Medicine

## 2019-07-31 VITALS — BP 123/81 | HR 66 | Temp 98.1°F | Ht 59.0 in | Wt 205.0 lb

## 2019-07-31 DIAGNOSIS — E559 Vitamin D deficiency, unspecified: Secondary | ICD-10-CM

## 2019-07-31 DIAGNOSIS — Z6841 Body Mass Index (BMI) 40.0 and over, adult: Secondary | ICD-10-CM | POA: Diagnosis not present

## 2019-07-31 DIAGNOSIS — R7303 Prediabetes: Secondary | ICD-10-CM | POA: Diagnosis not present

## 2019-07-31 MED ORDER — VITAMIN D (ERGOCALCIFEROL) 1.25 MG (50000 UNIT) PO CAPS: 50000.0000 [IU] | ORAL_CAPSULE | ORAL | 0 refills | Status: DC

## 2019-08-01 ENCOUNTER — Encounter (INDEPENDENT_AMBULATORY_CARE_PROVIDER_SITE_OTHER): Payer: Self-pay | Admitting: Family Medicine

## 2019-08-01 NOTE — Progress Notes (Signed)
Office: [phone]  /  Fax: [phone]   HPI:   Chief Complaint: OBESITY Darlene [name] is here to discuss her progress with her obesity treatment plan. She is on the Pescatarian eating plan or follow our protein rich vegetarian plan + 100 calories and is following her eating plan approximately 70 % of the time. She states she is doing stretches for 30 minutes 3 times per week. Darlene [name] is very bored with her options on the Pescatarian and Vegetarian plan. She would like to eat more vegetables. She is also frustrated with her slow weight loss.  Her weight is 205 lb (93 kg) today and has had a weight loss of 1 pounds over a period of 2 weeks since her last visit. She has lost 8 lbs since starting treatment with [place].  Vitamin D Deficiency Darlene [name] has a diagnosis of vitamin D deficiency. She is currently taking prescription Vit D. Her Vit D level was very low at 19.1 on 06/04/2019. She denies nausea, vomiting or muscle weakness.  Pre-Diabetes Darlene [name] has a diagnosis of pre-diabetes based on her elevated Hgb A1c and was informed this puts her at greater risk of developing diabetes. Last A1c was 5.9 on 06/04/2019. She is not taking metformin currently and denies polyphagia. She continues to work on diet and exercise to decrease risk of diabetes. She denies hypoglycemia.  ASSESSMENT AND PLAN:  Vitamin D deficiency - Plan: Vitamin D, Ergocalciferol, (DRISDOL) 1.25 MG (50000 UT) CAPS capsule  Prediabetes  Class 3 severe obesity with serious comorbidity and body mass index (BMI) of 40.0 to 44.9 in adult, unspecified obesity type ([place])  PLAN:  Vitamin D Deficiency Darlene [name] was informed that low vitamin D levels contributes to fatigue and are associated with obesity, breast, and colon cancer. Darlene [name] agrees to continue taking prescription Vit D 50,000 IU every week #4 and we will refill for 1 month. She will follow up for routine testing of vitamin D, at least 2-3 times per year. She was informed of the  risk of over-replacement of vitamin D and agrees to not increase her dose unless she discusses this with [place] first. Darlene [name] agrees to follow up with our clinic in 2 to 4 weeks.  Pre-Diabetes Darlene [name] will continue her meal plan, and will continue to work on weight loss, exercise, and decreasing simple carbohydrates in her diet to help decrease the risk of diabetes.  Darlene [name] agrees to follow up with [place] as directed to monitor her progress.  Obesity Darlene [name] is currently in the action stage of change. As such, her goal is to continue with weight loss efforts She has agreed to follow a lower carbohydrate, vegetable and lean protein rich diet plan Darlene [name] has been instructed to work up to a goal of 150 minutes of combined cardio and strengthening exercise per week or walk as tolerated 3 to 4 days per week for weight loss and overall health benefits. We discussed the following Behavioral Modification Strategies today: decreasing simple carbohydrates and planning for success We discussed the Low Carbohydrate plan and she would like to try it. Reassurance was provided. I advised [name] that her weight loss is right on track where she should be.   Darlene [name] has agreed to follow up with our clinic in 2 to 4 weeks. She was informed of the importance of frequent follow up visits to maximize her success with intensive lifestyle modifications for her multiple health conditions.  ALLERGIES: Allergies  Allergen Reactions   Codeine    Propoxyphene N-Acetaminophen  REACTION: "makes me sick"    MEDICATIONS: Current Outpatient Medications on File Prior to Visit  Medication Sig Dispense Refill   Calcium 600-200 MG-UNIT tablet Take 1 tablet by mouth daily.     Cyanocobalamin (VITAMIN B 12) 500 MCG TABS Take 1 tablet by mouth daily.     hydrochlorothiazide (HYDRODIURIL) 12.5 MG tablet TAKE 1 TABLET(12.5 MG) BY MOUTH DAILY 90 tablet 2   Magnesium 400 MG TABS Take 1 tablet by mouth daily.     No current  facility-administered medications on file prior to visit.     PAST MEDICAL HISTORY: Past Medical History:  Diagnosis Date   Constipation    Depression    Elevated LFTs    history of, normal abdominal ultrasound, LFT's normal 01/2007   Family history of breast cancer    Family history of kidney cancer    Family history of prostate cancer    Gallbladder problem    History of chest pain    rulre out MI in 12/2006.   Hyperlipidemia    Hypertension    Lower extremity edema    Obesity    Osteoarthritis of both knees 12/26/2007   Xray of knee 10/2007: Bilateral osteoarthritis, sent to PT but did not complete tx course.     Osteopenia 07/05/2015   Prediabetes 11/30/2015    PAST SURGICAL HISTORY: Past Surgical History:  Procedure Laterality Date   ABDOMINAL HYSTERECTOMY  1985   partial   APPENDECTOMY  1985   CHOLECYSTECTOMY  1985    SOCIAL HISTORY: Social History   Tobacco Use   Smoking status: Never Smoker   Smokeless tobacco: Never Used  Substance Use Topics   Alcohol use: No   Drug use: No    FAMILY HISTORY: Family History  Problem Relation Age of Onset   Heart disease Mother    Diabetes Mother    Hypertension Mother    Kidney cancer Father    Cancer Sister    Breast cancer Sister 87       PALB2+   Heart attack Brother    Prostate cancer Brother 23   Cancer Paternal Aunt        NOS   Prostate cancer Paternal Uncle    Breast cancer Sister 57   Breast cancer Sister 12    ROS: Review of Systems  Constitutional: Positive for weight loss.  Gastrointestinal: Negative for nausea and vomiting.  Musculoskeletal:       Negative muscle weakness  Endo/Heme/Allergies:       Negative hypoglycemia Negative polyphagia    PHYSICAL EXAM: Blood pressure 123/81, pulse 66, temperature 98.1 F (36.7 C), height '4\' 11"'$  (1.499 m), weight 205 lb (93 kg), SpO2 100 %. Body mass index is 41.4 kg/m. Physical Exam Vitals signs reviewed.    Constitutional:      Appearance: Normal appearance. She is obese.  Cardiovascular:     Rate and Rhythm: Normal rate.     Pulses: Normal pulses.  Pulmonary:     Effort: Pulmonary effort is normal.     Breath sounds: Normal breath sounds.  Musculoskeletal: Normal range of motion.  Skin:    General: Skin is warm and dry.  Neurological:     Mental Status: She is alert and oriented to person, place, and time.  Psychiatric:        Mood and Affect: Mood normal.        Behavior: Behavior normal.     RECENT LABS AND TESTS: BMET    Component Value  Date/Time   NA 139 06/04/2019 0935   K 3.7 06/04/2019 0935   CL 98 06/04/2019 0935   CO2 25 06/04/2019 0935   GLUCOSE 98 06/04/2019 0935   GLUCOSE 99 03/01/2018 0913   BUN 12 06/04/2019 0935   CREATININE 0.78 06/04/2019 0935   CREATININE 0.87 12/22/2014 1656   CALCIUM 9.9 06/04/2019 0935   GFRNONAA 79 06/04/2019 0935   GFRNONAA 72 12/22/2014 1656   GFRAA 92 06/04/2019 0935   GFRAA 83 12/22/2014 1656   Lab Results  Component Value Date   HGBA1C 5.9 (H) 06/04/2019   HGBA1C 5.9 03/01/2018   HGBA1C 5.8 05/08/2017   HGBA1C 5.5 10/28/2016   HGBA1C 5.7 12/30/2015   Lab Results  Component Value Date   INSULIN 10.7 06/04/2019   CBC    Component Value Date/Time   WBC 7.3 06/04/2019 0935   WBC 8.1 12/22/2014 1656   RBC 4.86 06/04/2019 0935   RBC 4.93 12/22/2014 1656   HGB 12.7 06/04/2019 0935   HCT 40.4 06/04/2019 0935   PLT 352 12/30/2015 1405   MCV 83 06/04/2019 0935   MCH 26.1 (L) 06/04/2019 0935   MCH 27.0 12/22/2014 1656   MCHC 31.4 (L) 06/04/2019 0935   MCHC 32.8 12/22/2014 1656   RDW 13.9 06/04/2019 0935   LYMPHSABS 2.9 06/04/2019 0935   MONOABS 0.6 12/22/2014 1656   EOSABS 0.1 06/04/2019 0935   BASOSABS 0.1 06/04/2019 0935   Iron/TIBC/Ferritin/ %Sat No results found for: IRON, TIBC, FERRITIN, IRONPCTSAT Lipid Panel     Component Value Date/Time   CHOL 186 06/04/2019 0935   TRIG 120 06/04/2019 0935   HDL 64  06/04/2019 0935   CHOLHDL 3 03/01/2018 0913   VLDL 19.2 03/01/2018 0913   LDLCALC 101 (H) 06/04/2019 0935   Hepatic Function Panel     Component Value Date/Time   PROT 6.9 06/04/2019 0935   ALBUMIN 4.5 06/04/2019 0935   AST 19 06/04/2019 0935   ALT 13 06/04/2019 0935   ALKPHOS 114 06/04/2019 0935   BILITOT 0.3 06/04/2019 0935      Component Value Date/Time   TSH 1.620 06/04/2019 0935   TSH 1.295 10/21/2010 2048      OBESITY BEHAVIORAL INTERVENTION VISIT  Today's visit was # 5   Starting weight: 213 lbs Starting date: 06/04/2019 Today's weight : 205 lbs Today's date: 07/31/2019 Total lbs lost to date: 8 At least 15 minutes were spent on discussing the following behavioral intervention visit.   ASK: We discussed the diagnosis of obesity with Darlene [name] today and Darlene [name] agreed to give [place] permission to discuss obesity behavioral modification therapy today.  ASSESS: Tyreisha has the diagnosis of obesity and her BMI today is 41.38 Kiylah is in the action stage of change   ADVISE: Illona was educated on the multiple health risks of obesity as well as the benefit of weight loss to improve her health. She was advised of the need for long term treatment and the importance of lifestyle modifications to improve her current health and to decrease her risk of future health problems.  AGREE: Multiple dietary modification options and treatment options were discussed and  Kallen agreed to follow the recommendations documented in the above note.  ARRANGE: Joletta was educated on the importance of frequent visits to treat obesity as outlined per CMS and USPSTF guidelines and agreed to schedule her next follow up appointment today.  Darlene [name] Durie, am acting as transcriptionist for [employer], FNP-C  I have reviewed the  above documentation for accuracy and completeness, and I agree with the above.  - Rhilyn Battle, FNP-C.

## 2019-08-02 ENCOUNTER — Ambulatory Visit
Admission: RE | Admit: 2019-08-02 | Discharge: 2019-08-02 | Disposition: A | Discharge status: Home / Self Care | Payer: Medicare HMO | Source: Ambulatory Visit | Attending: Family Medicine | Admitting: Family Medicine

## 2019-08-02 ENCOUNTER — Other Ambulatory Visit: Payer: Self-pay

## 2019-08-02 DIAGNOSIS — Z1231 Encounter for screening mammogram for malignant neoplasm of breast: Secondary | ICD-10-CM

## 2019-08-20 ENCOUNTER — Other Ambulatory Visit: Payer: Self-pay

## 2019-08-20 ENCOUNTER — Ambulatory Visit (INDEPENDENT_AMBULATORY_CARE_PROVIDER_SITE_OTHER): Payer: Medicare HMO | Admitting: Family Medicine

## 2019-08-20 ENCOUNTER — Encounter (INDEPENDENT_AMBULATORY_CARE_PROVIDER_SITE_OTHER): Payer: Self-pay | Admitting: Family Medicine

## 2019-08-20 VITALS — BP 132/80 | HR 68 | Temp 98.3°F | Ht 59.0 in | Wt 205.0 lb

## 2019-08-20 DIAGNOSIS — R7303 Prediabetes: Secondary | ICD-10-CM | POA: Diagnosis not present

## 2019-08-20 DIAGNOSIS — Z6841 Body Mass Index (BMI) 40.0 and over, adult: Secondary | ICD-10-CM | POA: Diagnosis not present

## 2019-08-26 ENCOUNTER — Encounter (INDEPENDENT_AMBULATORY_CARE_PROVIDER_SITE_OTHER): Payer: Self-pay | Admitting: Family Medicine

## 2019-08-26 NOTE — Progress Notes (Signed)
Office: [phone]  /  Fax: [phone]   HPI:   Chief Complaint: OBESITY Darlene [name] is here to discuss her progress with her obesity treatment plan. She is on the Pescatarian eating plan and is following her eating plan approximately 60 % of the time. She states she is doing stretches, leg kicks and squats 15 minutes 3 times per week. Darlene [name] is very frustrated with the lack of weight loss. However, she is not following the plan exactly due to her lack of understanding of the plan. She was to start the lower carb plan after last visit but has instead been trying to follow the Pescatarian plan. She struggles to eat all of the food on a 1200 cal per day plan.  She is very motivated to lose weight to have total joint surgery. Her weight is 205 lb (93 kg) today and she has maintained weight since her last visit. She has lost 8 lbs since starting treatment with [place].  Pre-Diabetes Darlene [name] has a diagnosis of prediabetes based on her elevated Hgb A1c and was informed this puts her at greater risk of developing diabetes. She is not taking medications currently. Darlene [name] denies polyphagia.  Her last A1c was at 5.9 (06/04/19). Darlene [name] continues to work on diet and exercise to decrease risk of diabetes.   ASSESSMENT AND PLAN:  Prediabetes  Class 3 severe obesity with serious comorbidity and body mass index (BMI) of 40.0 to 44.9 in adult, unspecified obesity type ([place])  PLAN:  Pre-Diabetes Darlene [name] will continue to work on weight loss, exercise, and decreasing simple carbohydrates in her diet to help decrease the risk of diabetes.  Darlene [name] will continue with the meal plan and follow up with [place] as directed to monitor her progress.  Obesity Darlene [name] is currently in the action stage of change. As such, her goal is to continue with weight loss efforts She has agreed to follow the Category 1 plan which is less food than pescatarian or cat 2 plan.  Darlene [name] will continue her current exercise regimen for  weight loss and overall health benefits. We discussed the following Behavioral Modification Strategies today: planning for success, increasing lean protein intake and decreasing simple carbohydrates   We went over the Category 1 plan in detail. She may substitute in a higher protein soup for lunch.  Darlene [name] has agreed to follow up with our clinic in 2 weeks. She was informed of the importance of frequent follow up visits to maximize her success with intensive lifestyle modifications for her multiple health conditions.  ALLERGIES: Allergies  Allergen Reactions  . Codeine   . Propoxyphene N-Acetaminophen     REACTION: "makes me sick"    MEDICATIONS: Current Outpatient Medications on File Prior to Visit  Medication Sig Dispense Refill  . Calcium 600-200 MG-UNIT tablet Take 1 tablet by mouth daily.    . Cyanocobalamin (VITAMIN B 12) 500 MCG TABS Take 1 tablet by mouth daily.    . hydrochlorothiazide (HYDRODIURIL) 12.5 MG tablet TAKE 1 TABLET(12.5 MG) BY MOUTH DAILY 90 tablet 2  . Magnesium 400 MG TABS Take 1 tablet by mouth daily.    . Vitamin D, Ergocalciferol, (DRISDOL) 1.25 MG (50000 UT) CAPS capsule Take 1 capsule (50,000 Units total) by mouth every 7 (seven) days. 4 capsule 0   No current facility-administered medications on file prior to visit.     PAST MEDICAL HISTORY: Past Medical History:  Diagnosis Date  . Constipation   . Depression   . Elevated LFTs    history  of, normal abdominal ultrasound, LFT's normal 01/2007  . Family history of breast cancer   . Family history of kidney cancer   . Family history of prostate cancer   . Gallbladder problem   . History of chest pain    rulre out MI in 12/2006.  [facility] Hyperlipidemia   . Hypertension   . Lower extremity edema   . Obesity   . Osteoarthritis of both knees 12/26/2007   Xray of knee 10/2007: Bilateral osteoarthritis, sent to PT but did not complete tx course.    . Osteopenia 07/05/2015  . Prediabetes 11/30/2015    PAST  SURGICAL HISTORY: Past Surgical History:  Procedure Laterality Date  . ABDOMINAL HYSTERECTOMY  1985   partial  . APPENDECTOMY  1985  . CHOLECYSTECTOMY  1985    SOCIAL HISTORY: Social History   Tobacco Use  . Smoking status: Never Smoker  . Smokeless tobacco: Never Used  Substance Use Topics  . Alcohol use: No  . Drug use: No    FAMILY HISTORY: Family History  Problem Relation Age of Onset  . Heart disease Mother   . Diabetes Mother   . Hypertension Mother   . Kidney cancer Father   . Cancer Sister   . Breast cancer Sister 38       PALB2+  . Heart attack Brother   . Prostate cancer Brother 68  . Cancer Paternal Aunt        NOS  . Prostate cancer Paternal Uncle   . Breast cancer Sister 54  . Breast cancer Sister 79    ROS: Review of Systems  Constitutional: Negative for weight loss.  Endo/Heme/Allergies:       Negative for polyphagia    PHYSICAL EXAM: Blood pressure 132/80, pulse 68, temperature 98.3 F (36.8 C), temperature source Oral, height _0  (1.499 m), weight 205 lb (93 kg), SpO2 98 %. Body mass index is 41.4 kg/m. Physical Exam Vitals signs reviewed.  Constitutional:      Appearance: Normal appearance. She is well-developed. She is obese.  Cardiovascular:     Rate and Rhythm: Normal rate.  Pulmonary:     Effort: Pulmonary effort is normal.  Musculoskeletal: Normal range of motion.  Skin:    General: Skin is warm and dry.  Neurological:     Mental Status: She is alert and oriented to person, place, and time.  Psychiatric:        Mood and Affect: Mood normal.        Behavior: Behavior normal.     RECENT LABS AND TESTS: BMET    Component Value Date/Time   NA 139 06/04/2019 0935   K 3.7 06/04/2019 0935   CL 98 06/04/2019 0935   CO2 25 06/04/2019 0935   GLUCOSE 98 06/04/2019 0935   GLUCOSE 99 03/01/2018 0913   BUN 12 06/04/2019 0935   CREATININE 0.78 06/04/2019 0935   CREATININE 0.87 12/22/2014 1656   CALCIUM 9.9 06/04/2019 0935    GFRNONAA 79 06/04/2019 0935   GFRNONAA 72 12/22/2014 1656   GFRAA 92 06/04/2019 0935   GFRAA 83 12/22/2014 1656   Lab Results  Component Value Date   HGBA1C 5.9 (H) 06/04/2019   HGBA1C 5.9 03/01/2018   HGBA1C 5.8 05/08/2017   HGBA1C 5.5 10/28/2016   HGBA1C 5.7 12/30/2015   Lab Results  Component Value Date   INSULIN 10.7 06/04/2019   CBC    Component Value Date/Time   WBC 7.3 06/04/2019 0935   WBC 8.1 12/22/2014 1656  RBC 4.86 06/04/2019 0935   RBC 4.93 12/22/2014 1656   HGB 12.7 06/04/2019 0935   HCT 40.4 06/04/2019 0935   PLT 352 12/30/2015 1405   MCV 83 06/04/2019 0935   MCH 26.1 (L) 06/04/2019 0935   MCH 27.0 12/22/2014 1656   MCHC 31.4 (L) 06/04/2019 0935   MCHC 32.8 12/22/2014 1656   RDW 13.9 06/04/2019 0935   LYMPHSABS 2.9 06/04/2019 0935   MONOABS 0.6 12/22/2014 1656   EOSABS 0.1 06/04/2019 0935   BASOSABS 0.1 06/04/2019 0935   Iron/TIBC/Ferritin/ %Sat No results found for: IRON, TIBC, FERRITIN, IRONPCTSAT Lipid Panel     Component Value Date/Time   CHOL 186 06/04/2019 0935   TRIG 120 06/04/2019 0935   HDL 64 06/04/2019 0935   CHOLHDL 3 03/01/2018 0913   VLDL 19.2 03/01/2018 0913   LDLCALC 101 (H) 06/04/2019 0935   Hepatic Function Panel     Component Value Date/Time   PROT 6.9 06/04/2019 0935   ALBUMIN 4.5 06/04/2019 0935   AST 19 06/04/2019 0935   ALT 13 06/04/2019 0935   ALKPHOS 114 06/04/2019 0935   BILITOT 0.3 06/04/2019 0935      Component Value Date/Time   TSH 1.620 06/04/2019 0935   TSH 1.295 10/21/2010 2048     Ref. Range 06/04/2019 09:35  Vitamin D, 25-Hydroxy Latest Ref Range: 30.0 - 100.0 ng/mL 19.1 (L)    OBESITY BEHAVIORAL INTERVENTION VISIT  Today's visit was # 6   Starting weight: 213 lbs Starting date: 06/04/2019 Today's weight : 205 lbs Today's date: 08/20/2019 Total lbs lost to date: 8  At least 15 minutes were spent on discussing the following behavioral intervention visit.    08/20/2019  Height _0   (1.499 m)  Weight 205 lb (93 kg)  BMI (Calculated) 41.38  BLOOD PRESSURE - SYSTOLIC 144  BLOOD PRESSURE - DIASTOLIC 80   Body Fat % 31.5 %  Total Body Water (lbs) 67.6 lbs    ASK: We discussed the diagnosis of obesity with [facility] today and Darlene [name] agreed to give [place] permission to discuss obesity behavioral modification therapy today.  ASSESS: Darlene [name] has the diagnosis of obesity and her BMI today is 41.38 Darlene [name] is in the action stage of change   ADVISE: Darlene [name] was educated on the multiple health risks of obesity as well as the benefit of weight loss to improve her health. She was advised of the need for long term treatment and the importance of lifestyle modifications to improve her current health and to decrease her risk of future health problems.  AGREE: Multiple dietary modification options and treatment options were discussed and  Darlene [name] agreed to follow the recommendations documented in the above note.  ARRANGE: Darlene [name] was educated on the importance of frequent visits to treat obesity as outlined per CMS and USPSTF guidelines and agreed to schedule her next follow up appointment today.  I, Doreene Nest, am acting as transcriptionist for [employer], FNP-C  I have reviewed the above documentation for accuracy and completeness, and I agree with the above.  - Ebbie Cherry, FNP-C.

## 2019-09-03 ENCOUNTER — Ambulatory Visit (INDEPENDENT_AMBULATORY_CARE_PROVIDER_SITE_OTHER): Payer: Medicare HMO | Admitting: Family Medicine

## 2019-09-03 ENCOUNTER — Encounter (INDEPENDENT_AMBULATORY_CARE_PROVIDER_SITE_OTHER): Payer: Self-pay | Admitting: Family Medicine

## 2019-09-03 ENCOUNTER — Other Ambulatory Visit: Payer: Self-pay

## 2019-09-03 VITALS — BP 124/76 | HR 70 | Temp 98.0°F | Ht 59.0 in | Wt 203.0 lb

## 2019-09-03 DIAGNOSIS — R7303 Prediabetes: Secondary | ICD-10-CM

## 2019-09-03 DIAGNOSIS — Z6841 Body Mass Index (BMI) 40.0 and over, adult: Secondary | ICD-10-CM

## 2019-09-03 DIAGNOSIS — E559 Vitamin D deficiency, unspecified: Secondary | ICD-10-CM | POA: Diagnosis not present

## 2019-09-03 MED ORDER — VITAMIN D (ERGOCALCIFEROL) 1.25 MG (50000 UNIT) PO CAPS: 50000.0000 [IU] | ORAL_CAPSULE | ORAL | 0 refills | Status: DC

## 2019-09-04 ENCOUNTER — Encounter (INDEPENDENT_AMBULATORY_CARE_PROVIDER_SITE_OTHER): Payer: Self-pay | Admitting: Family Medicine

## 2019-09-04 NOTE — Progress Notes (Signed)
Office: [phone]  /  Fax: [phone]   HPI:   Chief Complaint: OBESITY Darlene [name] is here to discuss her progress with her obesity treatment plan. She is on the Category 1 plan and follow the Pescatarian eating plan and is following her eating plan approximately 80 % of the time. She states she is stretching, doing back leg kicks and squats 15 minutes 3 times per week. Darlene [name] likes the Category 1 plan very well. She would like to lose weight faster. Darlene [name] is moving food around on the plan to better suit her, but she does like the amount of food on the Category 1 plan. She is hopeful to have total knee replacement very soon. Ortho required her to lose to 40 BMI and she is 40 BMI today. Her weight is 203 lb (92.1 kg) today and has had a weight loss of 2 pounds over a period of 2 weeks since her last visit. She has lost 10 lbs since starting treatment with [place].  Vitamin D deficiency Darlene [name] has a diagnosis of vitamin D deficiency. Her last vitamin D level was at 19.1 on 06/04/19 and was not at goal. Darlene [name] is currently taking vit D and she denies nausea, vomiting or muscle weakness.  Pre-Diabetes Darlene [name] has a diagnosis of prediabetes based on her elevated Hgb A1c and was informed this puts her at greater risk of developing diabetes. Her last A1c was at 5.9 on 06/04/19. Darlene [name] is not taking metformin and she denies polyphagia. She continues to work on diet and exercise to decrease risk of diabetes.   ASSESSMENT AND PLAN:  Vitamin D deficiency - Plan: Vitamin D, Ergocalciferol, (DRISDOL) 1.25 MG (50000 UT) CAPS capsule, DISCONTINUED: Vitamin D, Ergocalciferol, (DRISDOL) 1.25 MG (50000 UT) CAPS capsule  Prediabetes  Class 3 severe obesity with serious comorbidity and body mass index (BMI) of 40.0 to 44.9 in adult, unspecified obesity type ([facility])  PLAN:  Vitamin D Deficiency Low vitamin D level contributes to fatigue and are associated with obesity, breast, and colon cancer. Darlene [name] agrees  to continue to take prescription Vit D '@50' ,000 IU weekly #4 with no refills and she will follow up for routine testing of vitamin D, at least 2-3 times per year to avoid over-replacement. We will check vitamin D level at the next visit and she agrees to follow up as directed.  Pre-Diabetes Darlene [name] will continue to work on weight loss, exercise, and decreasing simple carbohydrates to help decrease the risk of diabetes. Darlene [name] will continue with the meal plan and follow up as directed.   Obesity Darlene [name] is currently in the action stage of change. As such, her goal is to continue with weight loss efforts She has agreed to follow the Category 1 plan Darlene [name] will continue her current exercise regimen for weight loss and overall health benefits. We discussed the following Behavioral Modification Strategies today: planning for success and work on meal planning and easy cooking plans  Handouts for spice mixes were given to patient today.  Darlene [name] has agreed to follow up with our clinic in 2 weeks. She was informed of the importance of frequent follow up visits to maximize her success with intensive lifestyle modifications for her multiple health conditions.  ALLERGIES: Allergies  Allergen Reactions   Codeine    Propoxyphene N-Acetaminophen     REACTION: "makes me sick"    MEDICATIONS: Current Outpatient Medications on File Prior to Visit  Medication Sig Dispense Refill   Calcium 600-200 MG-UNIT tablet Take 1 tablet by mouth daily.  Cyanocobalamin (VITAMIN B 12) 500 MCG TABS Take 1 tablet by mouth daily.     hydrochlorothiazide (HYDRODIURIL) 12.5 MG tablet TAKE 1 TABLET(12.5 MG) BY MOUTH DAILY 90 tablet 2   Magnesium 400 MG TABS Take 1 tablet by mouth daily.     No current facility-administered medications on file prior to visit.     PAST MEDICAL HISTORY: Past Medical History:  Diagnosis Date   Constipation    Depression    Elevated LFTs    history of, normal abdominal  ultrasound, LFT's normal 01/2007   Family history of breast cancer    Family history of kidney cancer    Family history of prostate cancer    Gallbladder problem    History of chest pain    rulre out MI in 12/2006.   Hyperlipidemia    Hypertension    Lower extremity edema    Obesity    Osteoarthritis of both knees 12/26/2007   Xray of knee 10/2007: Bilateral osteoarthritis, sent to PT but did not complete tx course.     Osteopenia 07/05/2015   Prediabetes 11/30/2015    PAST SURGICAL HISTORY: Past Surgical History:  Procedure Laterality Date   ABDOMINAL HYSTERECTOMY  1985   partial   APPENDECTOMY  1985   CHOLECYSTECTOMY  1985    SOCIAL HISTORY: Social History   Tobacco Use   Smoking status: Never Smoker   Smokeless tobacco: Never Used  Substance Use Topics   Alcohol use: No   Drug use: No    FAMILY HISTORY: Family History  Problem Relation Age of Onset   Heart disease Mother    Diabetes Mother    Hypertension Mother    Kidney cancer Father    Cancer Sister    Breast cancer Sister 60       PALB2+   Heart attack Brother    Prostate cancer Brother 61   Cancer Paternal Aunt        NOS   Prostate cancer Paternal Uncle    Breast cancer Sister 78   Breast cancer Sister 33    ROS: Review of Systems  Constitutional: Positive for weight loss.  Gastrointestinal: Negative for nausea and vomiting.  Musculoskeletal:       Negative for muscle weakness  Endo/Heme/Allergies:       Negative for polyphagia    PHYSICAL EXAM: Blood pressure 124/76, pulse 70, temperature 98 F (36.7 C), temperature source Oral, height '4\' 11"'  (1.499 m), weight 203 lb (92.1 kg), SpO2 100 %. Body mass index is 41 kg/m. Physical Exam Vitals signs reviewed.  Constitutional:      Appearance: Normal appearance. She is well-developed. She is obese.  Cardiovascular:     Rate and Rhythm: Normal rate.  Pulmonary:     Effort: Pulmonary effort is normal.    Musculoskeletal: Normal range of motion.  Skin:    General: Skin is warm and dry.  Neurological:     Mental Status: She is alert and oriented to person, place, and time.  Psychiatric:        Mood and Affect: Mood normal.        Behavior: Behavior normal.     RECENT LABS AND TESTS: BMET    Component Value Date/Time   NA 139 06/04/2019 0935   K 3.7 06/04/2019 0935   CL 98 06/04/2019 0935   CO2 25 06/04/2019 0935   GLUCOSE 98 06/04/2019 0935   GLUCOSE 99 03/01/2018 0913   BUN 12 06/04/2019 0935   CREATININE 0.78  06/04/2019 0935   CREATININE 0.87 12/22/2014 1656   CALCIUM 9.9 06/04/2019 0935   GFRNONAA 79 06/04/2019 0935   GFRNONAA 72 12/22/2014 1656   GFRAA 92 06/04/2019 0935   GFRAA 83 12/22/2014 1656   Lab Results  Component Value Date   HGBA1C 5.9 (H) 06/04/2019   HGBA1C 5.9 03/01/2018   HGBA1C 5.8 05/08/2017   HGBA1C 5.5 10/28/2016   HGBA1C 5.7 12/30/2015   Lab Results  Component Value Date   INSULIN 10.7 06/04/2019   CBC    Component Value Date/Time   WBC 7.3 06/04/2019 0935   WBC 8.1 12/22/2014 1656   RBC 4.86 06/04/2019 0935   RBC 4.93 12/22/2014 1656   HGB 12.7 06/04/2019 0935   HCT 40.4 06/04/2019 0935   PLT 352 12/30/2015 1405   MCV 83 06/04/2019 0935   MCH 26.1 (L) 06/04/2019 0935   MCH 27.0 12/22/2014 1656   MCHC 31.4 (L) 06/04/2019 0935   MCHC 32.8 12/22/2014 1656   RDW 13.9 06/04/2019 0935   LYMPHSABS 2.9 06/04/2019 0935   MONOABS 0.6 12/22/2014 1656   EOSABS 0.1 06/04/2019 0935   BASOSABS 0.1 06/04/2019 0935   Iron/TIBC/Ferritin/ %Sat No results found for: IRON, TIBC, FERRITIN, IRONPCTSAT Lipid Panel     Component Value Date/Time   CHOL 186 06/04/2019 0935   TRIG 120 06/04/2019 0935   HDL 64 06/04/2019 0935   CHOLHDL 3 03/01/2018 0913   VLDL 19.2 03/01/2018 0913   LDLCALC 101 (H) 06/04/2019 0935   Hepatic Function Panel     Component Value Date/Time   PROT 6.9 06/04/2019 0935   ALBUMIN 4.5 06/04/2019 0935   AST 19  06/04/2019 0935   ALT 13 06/04/2019 0935   ALKPHOS 114 06/04/2019 0935   BILITOT 0.3 06/04/2019 0935      Component Value Date/Time   TSH 1.620 06/04/2019 0935   TSH 1.295 10/21/2010 2048     Ref. Range 06/04/2019 09:35  Vitamin D, 25-Hydroxy Latest Ref Range: 30.0 - 100.0 ng/mL 19.1 (L)    OBESITY BEHAVIORAL INTERVENTION VISIT  Today's visit was # 7   Starting weight: 213 lbs Starting date: 06/04/2019 Today's weight : 203 lbs Today's date: 09/03/2019 Total lbs lost to date: 10    09/03/2019  Height '4\' 11"'  (1.499 m)  Weight 203 lb (92.1 kg)  BMI (Calculated) 40.98  BLOOD PRESSURE - SYSTOLIC 115  BLOOD PRESSURE - DIASTOLIC 76   Body Fat % 52 %  Total Body Water (lbs) 67 lbs    ASK: We discussed the diagnosis of obesity with Darlene [name] today and Darlene [name] agreed to give [place] permission to discuss obesity behavioral modification therapy today.  ASSESS: Darlene [name] has the diagnosis of obesity and her BMI today is 40.98 Darlene [name] is in the action stage of change   ADVISE: Darlene [name] was educated on the multiple health risks of obesity as well as the benefit of weight loss to improve her health. She was advised of the need for long term treatment and the importance of lifestyle modifications to improve her current health and to decrease her risk of future health problems.  AGREE: Multiple dietary modification options and treatment options were discussed and  Darlene [name] agreed to follow the recommendations documented in the above note.  ARRANGE: Darlene [name] was educated on the importance of frequent visits to treat obesity as outlined per CMS and USPSTF guidelines and agreed to schedule her next follow up appointment today.  IDoreene Nest, am acting as Location manager for [employer],  FNP-C.  I have reviewed the above documentation for accuracy and completeness, and I agree with the above.  - Charnele Semple, FNP-C.

## 2019-09-16 ENCOUNTER — Encounter (INDEPENDENT_AMBULATORY_CARE_PROVIDER_SITE_OTHER): Payer: Self-pay

## 2019-09-16 ENCOUNTER — Ambulatory Visit (INDEPENDENT_AMBULATORY_CARE_PROVIDER_SITE_OTHER): Payer: Medicare HMO | Admitting: Family Medicine

## 2019-09-17 ENCOUNTER — Other Ambulatory Visit: Payer: Self-pay

## 2019-09-17 ENCOUNTER — Encounter (INDEPENDENT_AMBULATORY_CARE_PROVIDER_SITE_OTHER): Payer: Self-pay | Admitting: Family Medicine

## 2019-09-17 ENCOUNTER — Ambulatory Visit (INDEPENDENT_AMBULATORY_CARE_PROVIDER_SITE_OTHER): Payer: Medicare HMO | Admitting: Family Medicine

## 2019-09-17 VITALS — BP 134/84 | HR 89 | Temp 98.2°F | Ht 59.0 in | Wt 204.0 lb

## 2019-09-17 DIAGNOSIS — M1711 Unilateral primary osteoarthritis, right knee: Secondary | ICD-10-CM

## 2019-09-17 DIAGNOSIS — E559 Vitamin D deficiency, unspecified: Secondary | ICD-10-CM | POA: Diagnosis not present

## 2019-09-17 DIAGNOSIS — R7303 Prediabetes: Secondary | ICD-10-CM

## 2019-09-17 DIAGNOSIS — Z6841 Body Mass Index (BMI) 40.0 and over, adult: Secondary | ICD-10-CM

## 2019-09-18 LAB — CBC WITH DIFFERENTIAL/PLATELET
Basophils Absolute: 0 10*3/uL (ref 0.0–0.2)
Basos: 1 %
EOS (ABSOLUTE): 0.1 10*3/uL (ref 0.0–0.4)
Eos: 1 %
Hematocrit: 40.5 % (ref 34.0–46.6)
Hemoglobin: 13.3 g/dL (ref 11.1–15.9)
Immature Grans (Abs): 0 10*3/uL (ref 0.0–0.1)
Immature Granulocytes: 0 %
Lymphocytes Absolute: 2.9 10*3/uL (ref 0.7–3.1)
Lymphs: 43 %
MCH: 27 pg (ref 26.6–33.0)
MCHC: 32.8 g/dL (ref 31.5–35.7)
MCV: 82 fL (ref 79–97)
Monocytes Absolute: 0.6 10*3/uL (ref 0.1–0.9)
Monocytes: 8 %
Neutrophils Absolute: 3.3 10*3/uL (ref 1.4–7.0)
Neutrophils: 47 %
Platelets: 336 10*3/uL (ref 150–450)
RBC: 4.93 x10E6/uL (ref 3.77–5.28)
RDW: 13.5 % (ref 11.7–15.4)
WBC: 6.9 10*3/uL (ref 3.4–10.8)

## 2019-09-18 LAB — COMPREHENSIVE METABOLIC PANEL
ALT: 14 IU/L (ref 0–32)
AST: 23 IU/L (ref 0–40)
Albumin/Globulin Ratio: 1.8 (ref 1.2–2.2)
Albumin: 4.7 g/dL (ref 3.8–4.8)
Alkaline Phosphatase: 132 IU/L — ABNORMAL HIGH (ref 39–117)
BUN/Creatinine Ratio: 15 (ref 12–28)
BUN: 12 mg/dL (ref 8–27)
Bilirubin Total: 0.2 mg/dL (ref 0.0–1.2)
CO2: 25 mmol/L (ref 20–29)
Calcium: 9.7 mg/dL (ref 8.7–10.3)
Chloride: 100 mmol/L (ref 96–106)
Creatinine, Ser: 0.78 mg/dL (ref 0.57–1.00)
GFR calc Af Amer: 92 mL/min/{1.73_m2} (ref >59)
GFR calc non Af Amer: 79 mL/min/{1.73_m2} (ref >59)
Globulin, Total: 2.6 g/dL (ref 1.5–4.5)
Glucose: 93 mg/dL (ref 65–99)
Potassium: 4.6 mmol/L (ref 3.5–5.2)
Sodium: 140 mmol/L (ref 134–144)
Total Protein: 7.3 g/dL (ref 6.0–8.5)

## 2019-09-18 LAB — VITAMIN D 25 HYDROXY (VIT D DEFICIENCY, FRACTURES): Vit D, 25-Hydroxy: 46.7 ng/mL (ref 30.0–100.0)

## 2019-09-18 LAB — HEMOGLOBIN A1C
Est. average glucose Bld gHb Est-mCnc: 117 mg/dL
Hgb A1c MFr Bld: 5.7 % — ABNORMAL HIGH (ref 4.8–5.6)

## 2019-09-18 LAB — INSULIN, RANDOM: INSULIN: 17.7 u[IU]/mL (ref 2.6–24.9)

## 2019-09-24 ENCOUNTER — Encounter (INDEPENDENT_AMBULATORY_CARE_PROVIDER_SITE_OTHER): Payer: Self-pay | Admitting: Family Medicine

## 2019-09-24 NOTE — Progress Notes (Signed)
Chief Complaint: OBESITY Darlene [name] is here to discuss her progress with her obesity treatment plan. Darlene [name] is on the Category 1 plan and states she is following her eating plan approximately 70 % of the time. [facility] Lipscomb states she is exercising 0 minutes 0 times per week.  Today's visit was # 8  Starting weight: 213 lbs Starting date: 06/04/2019 Today's weight : 204 lbs Today's date: 09/17/2019 Total lbs lost to date: 9 Total lbs lost since last in-office visit: 0  Subjective:   Interim History: Darlene [name] had a upper respiratory infection last week, but she is feeling better now. She will be seeing her daughter for Christmas.  1. Vitamin D deficiency Darlene [name] is taking prescription Vit D. Last Vit D level was 19.1.  2. Pre-diabetes Darlene [name] is not on medications. She is due to have labs checked.  3. Osteoarthritis of right knee, unspecified osteoarthritis type Darlene [name] is hoping for right knee surgery soon. She has an Orthopedic appointment tomorrow.  Reviewed and updated this visit by clinician and staff: allergies, medications, problem list, medical history, surgical history, family history, social history and previous encounter notes.  General review of systems is unchanged or negative, with the exception of new weight gain.  Assessment:   1. Vitamin D deficiency   2. Prediabetes   3. Osteoarthritis of right knee, unspecified osteoarthritis type   4. Class 3 severe obesity with serious comorbidity and body mass index (BMI) of 40.0 to 44.9 in adult, unspecified obesity type ([place])    Plan:   1. Vitamin D deficiency Low Vitamin D level contributes to fatigue and are associated with obesity, breast, and colon cancer. Darlene [name] agrees to continue taking prescription Vitamin D 50,000 IU every week, no refill needed today. She will follow up for routine testing of vitamin D, at least 2-3 times per year to avoid over-replacement. We will check labs  today, and will continue to monitor.  2. Pre-diabetes Darlene [name] will continue to work on weight loss, exercise, and decreasing simple carbohydrates to help decrease the risk of diabetes. We will check labs today, and will continue to monitor.  3. Osteoarthritis of right knee, unspecified osteoarthritis type We will continue to monitor so that lack of movement does not impede weight loss.  4. Class 3 severe obesity with serious comorbidity and body mass index (BMI) of 40.0 to 44.9 in adult, unspecified obesity type (HCC) Darlene [name] is currently in the action stage of change. As such, her goal is to continue with weight loss efforts. She has agreed to follow the Category 1 plan. The exercise goal is to eventually work up to 150 minutes of combined cardio and strengthening exercise per week for weight loss and overall health benefits. We discussed the following Behavioral Modification Strategies today: increasing lean protein intake and increasing water intake.  Darlene [name] has agreed to follow-up with our clinic in 3 weeks. She was informed of the importance of frequent follow-up visits to maximize her success with intensive lifestyle modifications for her multiple health conditions.  Objective:   Blood pressure 134/84, pulse 89, temperature 98.2 F (36.8 C), temperature source Oral, height 4\' 11"  (1.499 m), weight 204 lb (92.5 kg), SpO2 98 %. Body mass index is 41.2 kg/m.  General: Cooperative, alert, well developed, in no acute distress. HEENT: Conjunctivae and lids unremarkable. Neck: No thyromegaly.  Cardiovascular: Regular rhythm.  Lungs: Normal work of breathing. Extremities: No edema.  Neurologic: No focal deficits.  Component Value Date/Time   WBC 6.9 09/17/2019 1701   WBC 8.1 12/22/2014 1656   RBC 4.93 09/17/2019 1701   RBC 4.93 12/22/2014 1656   HGB 13.3 09/17/2019 1701   HCT 40.5 09/17/2019 1701   PLT 336 09/17/2019 1701   MCV 82 09/17/2019 1701   MCH 27.0  09/17/2019 1701   MCH 27.0 12/22/2014 1656   MCHC 32.8 09/17/2019 1701   MCHC 32.8 12/22/2014 1656   RDW 13.5 09/17/2019 1701   LYMPHSABS 2.9 09/17/2019 1701   MONOABS 0.6 12/22/2014 1656   EOSABS 0.1 09/17/2019 1701   BASOSABS 0.0 09/17/2019 1701   Lab Results  Component Value Date   ALT 14 09/17/2019   AST 23 09/17/2019   ALKPHOS 132 (H) 09/17/2019   BILITOT 0.2 09/17/2019   Lab Results  Component Value Date   CREATININE 0.78 09/17/2019   BUN 12 09/17/2019   NA 140 09/17/2019   K 4.6 09/17/2019   CL 100 09/17/2019   CO2 25 09/17/2019   Lab Results  Component Value Date   HGBA1C 5.7 (H) 09/17/2019   HGBA1C 5.9 (H) 06/04/2019   HGBA1C 5.9 03/01/2018   HGBA1C 5.8 05/08/2017   HGBA1C 5.5 10/28/2016   Lab Results  Component Value Date   INSULIN 17.7 09/17/2019   INSULIN 10.7 06/04/2019   Lab Results  Component Value Date   TSH 1.620 06/04/2019   Lab Results  Component Value Date   CHOL 186 06/04/2019   HDL 64 06/04/2019   LDLCALC 101 (H) 06/04/2019   TRIG 120 06/04/2019   CHOLHDL 3 03/01/2018   Obesity Behavioral Intervention Visit Documentation for Insurance (15 Minutes):   ASK: We discussed the diagnosis of obesity with [name] today and Sanja agreed to give [place] permission to discuss obesity behavioral modification therapy today.  ASSESS: Darlene [name] has the diagnosis of obesity and her BMI today is 41.18. Darlene [name] is in the action stage of change.   ADVISE: Charman was educated on the multiple health risks of obesity as well as the benefit of weight loss to improve her health. She was advised of the need for long term treatment and the importance of lifestyle modifications to improve her current health and to decrease her risk of future health problems.  AGREE: Multiple dietary modification options and treatment options were discussed and  Darlene [name] agreed to follow the recommendations documented in the above note.  ARRANGE: Darlene [name] was  educated on the importance of frequent visits to treat obesity as outlined per CMS and USPSTF guidelines and agreed to schedule her next follow up appointment today.  Attestation Statements:   [name], am acting as transcriptionist for [employer], DO.  I have reviewed the above documentation for accuracy and completeness, and I agree with the above. Helane Rima, DO

## 2019-09-25 ENCOUNTER — Ambulatory Visit: Payer: Medicare HMO | Attending: Internal Medicine

## 2019-09-25 DIAGNOSIS — Z20822 Contact with and (suspected) exposure to covid-19: Secondary | ICD-10-CM

## 2019-09-26 LAB — NOVEL CORONAVIRUS, NAA: SARS-CoV-2, NAA: NOT DETECTED

## 2019-10-09 ENCOUNTER — Ambulatory Visit (INDEPENDENT_AMBULATORY_CARE_PROVIDER_SITE_OTHER): Payer: Medicare HMO | Admitting: Family Medicine

## 2019-10-10 ENCOUNTER — Ambulatory Visit (INDEPENDENT_AMBULATORY_CARE_PROVIDER_SITE_OTHER): Payer: Medicare HMO | Admitting: Bariatrics

## 2019-10-14 ENCOUNTER — Other Ambulatory Visit: Payer: Self-pay

## 2019-10-14 ENCOUNTER — Ambulatory Visit (INDEPENDENT_AMBULATORY_CARE_PROVIDER_SITE_OTHER): Payer: Medicare HMO | Admitting: Bariatrics

## 2019-10-14 ENCOUNTER — Encounter (INDEPENDENT_AMBULATORY_CARE_PROVIDER_SITE_OTHER): Payer: Self-pay | Admitting: Bariatrics

## 2019-10-14 VITALS — BP 115/67 | HR 78 | Temp 97.9°F | Ht 59.0 in | Wt 203.0 lb

## 2019-10-14 DIAGNOSIS — E559 Vitamin D deficiency, unspecified: Secondary | ICD-10-CM

## 2019-10-14 DIAGNOSIS — Z6841 Body Mass Index (BMI) 40.0 and over, adult: Secondary | ICD-10-CM

## 2019-10-14 DIAGNOSIS — R7303 Prediabetes: Secondary | ICD-10-CM

## 2019-10-14 NOTE — Progress Notes (Signed)
Chief Complaint:   OBESITY Darlene [name] is here to discuss her progress with her obesity treatment plan along with follow-up of her obesity related diagnoses. Darlene [name] is on the Category 1 Plan and states she is following her eating plan approximately 85% of the time. Darlene [name] states she is exercising 0 minutes 0 times per week.  Today's visit was #: 9 Starting weight: 213 lbs Starting date: 06/04/2019 Today's weight: 203 lbs Today's date: 10/14/2019 Total lbs lost to date: 10  Total lbs lost since last in-office visit: 1  Interim History: Darlene [name] is down 1 lb. She needs to have Orthopedic surgery and needs to get her BMI below or equal to 40. She reports doing well with her water intake.  Subjective:   Prediabetes. Darlene [name] has a diagnosis of prediabetes based on her elevated HgA1c and was informed this puts her at greater risk of developing diabetes. She continues to work on diet and exercise to decrease her risk of diabetes. She denies nausea or hypoglycemia. No polyphagia.  Lab Results  Component Value Date   HGBA1C 5.7 (H) 09/17/2019   Lab Results  Component Value Date   INSULIN 17.7 09/17/2019   INSULIN 10.7 06/04/2019   Vitamin D deficiency. No nausea, vomiting, or muscle weakness. Last Vitamin D level 46.7 on 09/17/2019.  Assessment/Plan:   Prediabetes. Darlene [name] will continue to work on weight loss, exercise, increasing protein and healthy fats, and decreasing simple carbohydrates to help decrease the risk of diabetes.    Vitamin D deficiency. Low Vitamin D level contributes to fatigue and are associated with obesity, breast, and colon cancer. She agrees to take prescription Vitamin D, Ergocalciferol, (DRISDOL) 1.25 MG (50000 UNIT) CAPS capsule every week #4 with 0 refills and will follow-up for routine testing of Vitamin D, at least 2-3 times per year to avoid over-replacement.   Class 3 severe obesity with serious comorbidity and body mass index (BMI) of 40.0  to 44.9 in adult, unspecified obesity type (HCC).  Darlene [name] is currently in the action stage of change. As such, her goal is to continue with weight loss efforts. She has agreed to practicing portion control and making smarter food choices, such as increasing vegetables and decreasing simple carbohydrates.   She will work on meal planning, intentional eating, will follow-up with her orthopedist, and will portion control foods.  Exercise goals: Darlene adults should follow the adult guidelines. When Darlene adults cannot meet the adult guidelines, they should be as physically active as their abilities and conditions will allow.  Darlene [name] that maintain or improve balance if they are at risk of falling.   Behavioral modification strategies: increasing lean protein intake, decreasing simple carbohydrates, increasing vegetables, increasing water intake, decreasing eating out, no skipping meals, meal planning and cooking strategies, keeping healthy foods in the home and planning for success.  Darlene [name] has agreed to follow-up with our clinic in 2 weeks. She was informed of the importance of frequent follow-up visits to maximize her success with intensive lifestyle modifications for her multiple health conditions.   Objective:   Blood pressure 115/67, pulse 78, temperature 97.9 F (36.6 C), height 4\' 11"  (1.499 m), weight 203 lb (92.1 kg), SpO2 98 %. Body mass index is 41 kg/m.  General: Cooperative, alert, well developed, in no acute distress. HEENT: Conjunctivae and lids unremarkable. Cardiovascular: Regular rhythm.  Lungs: Normal work of breathing. Neurologic: No focal deficits.   Lab Results  Component Value Date   CREATININE 0.78  09/17/2019   BUN 12 09/17/2019   NA 140 09/17/2019   K 4.6 09/17/2019   CL 100 09/17/2019   CO2 25 09/17/2019   Lab Results  Component Value Date   ALT 14 09/17/2019   AST 23 09/17/2019   ALKPHOS 132 (H) 09/17/2019   BILITOT 0.2  09/17/2019   Lab Results  Component Value Date   HGBA1C 5.7 (H) 09/17/2019   HGBA1C 5.9 (H) 06/04/2019   HGBA1C 5.9 03/01/2018   HGBA1C 5.8 05/08/2017   HGBA1C 5.5 10/28/2016   Lab Results  Component Value Date   INSULIN 17.7 09/17/2019   INSULIN 10.7 06/04/2019   Lab Results  Component Value Date   TSH 1.620 06/04/2019   Lab Results  Component Value Date   CHOL 186 06/04/2019   HDL 64 06/04/2019   LDLCALC 101 (H) 06/04/2019   TRIG 120 06/04/2019   CHOLHDL 3 03/01/2018   Lab Results  Component Value Date   WBC 6.9 09/17/2019   HGB 13.3 09/17/2019   HCT 40.5 09/17/2019   MCV 82 09/17/2019   PLT 336 09/17/2019   No results found for: IRON, TIBC, FERRITIN  Obesity Behavioral Intervention Documentation for Insurance:   Approximately 15 minutes were spent on the discussion below.  ASK: We discussed the diagnosis of obesity with Darlene [name] today and Darlene [name] agreed to give [place] permission to discuss obesity behavioral modification therapy today.  ASSESS: Darlene [name] has the diagnosis of obesity and her BMI today is 41.0. Darlene [name] is in the action stage of change.   ADVISE: Darlene [name] was educated on the multiple health risks of obesity as well as the benefit of weight loss to improve her health. She was advised of the need for long term treatment and the importance of lifestyle modifications to improve her current health and to decrease her risk of future health problems.  AGREE: Multiple dietary modification options and treatment options were discussed and Darlene [name] agreed to follow the recommendations documented in the above note.  ARRANGE: Darlene [name] was educated on the importance of frequent visits to treat obesity as outlined per CMS and USPSTF guidelines and agreed to schedule her next follow up appointment today.  Attestation Statements:   Reviewed by clinician on day of visit: allergies, medications, problem list, medical history, surgical history, family history, social  history, and previous encounter notes.  Migdalia Dk, am acting as Location manager for [employer], DO   I have reviewed the above documentation for accuracy and completeness, and I agree with the above. Jearld Lesch, DO

## 2019-10-15 ENCOUNTER — Encounter (INDEPENDENT_AMBULATORY_CARE_PROVIDER_SITE_OTHER): Payer: Self-pay | Admitting: Bariatrics

## 2019-10-15 MED ORDER — VITAMIN D (ERGOCALCIFEROL) 1.25 MG (50000 UNIT) PO CAPS: 50000.0000 [IU] | ORAL_CAPSULE | ORAL | 0 refills | Status: DC

## 2019-10-28 ENCOUNTER — Ambulatory Visit (INDEPENDENT_AMBULATORY_CARE_PROVIDER_SITE_OTHER): Payer: Medicare HMO | Admitting: Family Medicine

## 2019-10-29 ENCOUNTER — Ambulatory Visit (INDEPENDENT_AMBULATORY_CARE_PROVIDER_SITE_OTHER): Payer: Medicare HMO | Admitting: Physician Assistant

## 2019-10-29 ENCOUNTER — Encounter (INDEPENDENT_AMBULATORY_CARE_PROVIDER_SITE_OTHER): Payer: Self-pay | Admitting: Physician Assistant

## 2019-10-29 ENCOUNTER — Other Ambulatory Visit: Payer: Self-pay

## 2019-10-29 VITALS — BP 126/69 | HR 85 | Temp 97.6°F | Ht 59.0 in | Wt 203.0 lb

## 2019-10-29 DIAGNOSIS — E559 Vitamin D deficiency, unspecified: Secondary | ICD-10-CM

## 2019-10-29 DIAGNOSIS — Z6841 Body Mass Index (BMI) 40.0 and over, adult: Secondary | ICD-10-CM

## 2019-10-29 DIAGNOSIS — R7303 Prediabetes: Secondary | ICD-10-CM | POA: Diagnosis not present

## 2019-10-29 MED ORDER — VITAMIN D (ERGOCALCIFEROL) 1.25 MG (50000 UNIT) PO CAPS: 50000.0000 [IU] | ORAL_CAPSULE | ORAL | 0 refills | Status: DC

## 2019-10-30 NOTE — Progress Notes (Signed)
Chief Complaint:   OBESITY Darlene [name] is here to discuss her progress with her obesity treatment plan along with follow-up of her obesity related diagnoses. Darlene [name] is on practicing portion control and making smarter food choices, such as increasing vegetables and decreasing simple carbohydrates and states she is following her eating plan approximately 75% of the time. Darlene [name] states she is doing arm raises, leg lifts, kicks, and squats for 15 minutes 3 times per week.  Today's visit was #: 10 Starting weight: 213 lbs Starting date: 06/04/2019 Today's weight: 203 lbs Today's date: 10/29/2019 Total lbs lost to date: 10 Total lbs lost since last in-office visit: 0  Interim History: Darlene [name] reports that she is frustrated with her lack of weight loss. She is trying to lose 8 more pounds prior to February 23rd for a knee replacement.  Subjective:   1. Vitamin D deficiency Darlene [name]'s last Vit D level was 46.7. She is on Vit D and she denies nausea, vomiting, or muscle weakness. I discussed labs with the patient today.  2. Pre-diabetes Darlene [name] is not on medications and she denies polyphagia. Last A1c was 5.7. I discussed labs with the patient today.  Assessment/Plan:   1. Vitamin D deficiency Low Vitamin D level contributes to fatigue and are associated with obesity, breast, and colon cancer. We will refill prescription Vitamin D for 1 month, and then she will change to OTC. Darlene [name] will follow-up for routine testing of Vitamin D, at least 2-3 times per year to avoid over-replacement.  - Vitamin D, Ergocalciferol, (DRISDOL) 1.25 MG (50000 UNIT) CAPS capsule; Take 1 capsule (50,000 Units total) by mouth every 7 (seven) days.  Dispense: 4 capsule; Refill: 0  2. Pre-diabetes Darlene [name] will continue to work on weight loss, exercise, and decreasing simple carbohydrates to help decrease the risk of diabetes.   3. Class 3 severe obesity with serious comorbidity and body mass index (BMI) of 40.0 to  44.9 in adult, unspecified obesity type (HCC) Darlene [name] is currently in the action stage of change. As such, her goal is to continue with weight loss efforts. She has agreed to keeping a food journal and adhering to recommended goals of 1100-1150 calories and 80 grams of protein daily.   Exercise goals: No exercise has been prescribed at this time.  Behavioral modification strategies: planning for success and keeping a strict food journal.  Darlene [name] has agreed to follow-up with our clinic in 2 weeks. She was informed of the importance of frequent follow-up visits to maximize her success with intensive lifestyle modifications for her multiple health conditions.   Objective:   Blood pressure 126/69, pulse 85, temperature 97.6 F (36.4 C), temperature source Oral, height 4\' 11"  (1.499 m), weight 203 lb (92.1 kg), SpO2 100 %. Body mass index is 41 kg/m.  General: Cooperative, alert, well developed, in no acute distress. HEENT: Conjunctivae and lids unremarkable. Cardiovascular: Regular rhythm.  Lungs: Normal work of breathing. Neurologic: No focal deficits.   Lab Results  Component Value Date   CREATININE 0.78 09/17/2019   BUN 12 09/17/2019   NA 140 09/17/2019   K 4.6 09/17/2019   CL 100 09/17/2019   CO2 25 09/17/2019   Lab Results  Component Value Date   ALT 14 09/17/2019   AST 23 09/17/2019   ALKPHOS 132 (H) 09/17/2019   BILITOT 0.2 09/17/2019   Lab Results  Component Value Date   HGBA1C 5.7 (H) 09/17/2019   HGBA1C 5.9 (H) 06/04/2019   HGBA1C 5.9 03/01/2018  HGBA1C 5.8 05/08/2017   HGBA1C 5.5 10/28/2016   Lab Results  Component Value Date   INSULIN 17.7 09/17/2019   INSULIN 10.7 06/04/2019   Lab Results  Component Value Date   TSH 1.620 06/04/2019   Lab Results  Component Value Date   CHOL 186 06/04/2019   HDL 64 06/04/2019   LDLCALC 101 (H) 06/04/2019   TRIG 120 06/04/2019   CHOLHDL 3 03/01/2018   Lab Results  Component Value Date   WBC 6.9 09/17/2019    HGB 13.3 09/17/2019   HCT 40.5 09/17/2019   MCV 82 09/17/2019   PLT 336 09/17/2019   No results found for: IRON, TIBC, FERRITIN  Obesity Behavioral Intervention Documentation for Insurance:   Approximately 15 minutes were spent on the discussion below.  ASK: We discussed the diagnosis of obesity with Darlene [name] today and Darlene [name] agreed to give [place] permission to discuss obesity behavioral modification therapy today.  ASSESS: Darlene [name] has the diagnosis of obesity and her BMI today is 40.98. Darlene [name] is in the action stage of change.   ADVISE: Darlene [name] was educated on the multiple health risks of obesity as well as the benefit of weight loss to improve her health. She was advised of the need for long term treatment and the importance of lifestyle modifications to improve her current health and to decrease her risk of future health problems.  AGREE: Multiple dietary modification options and treatment options were discussed and Darlene [name] agreed to follow the recommendations documented in the above note.  ARRANGE: Darlene [name] was educated on the importance of frequent visits to treat obesity as outlined per CMS and USPSTF guidelines and agreed to schedule her next follow up appointment today.  Attestation Statements:   Reviewed by clinician on day of visit: allergies, medications, problem list, medical history, surgical history, family history, social history, and previous encounter notes.   [name], am acting as transcriptionist for [employer], PA-C.  I have reviewed the above documentation for accuracy and completeness, and I agree with the above. Alois Cliche, PA-C

## 2019-11-13 ENCOUNTER — Ambulatory Visit (INDEPENDENT_AMBULATORY_CARE_PROVIDER_SITE_OTHER): Payer: Medicare HMO | Admitting: Family Medicine

## 2019-11-13 ENCOUNTER — Encounter (INDEPENDENT_AMBULATORY_CARE_PROVIDER_SITE_OTHER): Payer: Self-pay | Admitting: Family Medicine

## 2019-11-13 ENCOUNTER — Other Ambulatory Visit: Payer: Self-pay

## 2019-11-13 VITALS — BP 138/77 | HR 60 | Temp 97.7°F | Ht 59.0 in | Wt 202.0 lb

## 2019-11-13 DIAGNOSIS — R7303 Prediabetes: Secondary | ICD-10-CM

## 2019-11-13 DIAGNOSIS — Z6841 Body Mass Index (BMI) 40.0 and over, adult: Secondary | ICD-10-CM | POA: Diagnosis not present

## 2019-11-18 ENCOUNTER — Encounter (INDEPENDENT_AMBULATORY_CARE_PROVIDER_SITE_OTHER): Payer: Self-pay | Admitting: Family Medicine

## 2019-11-18 NOTE — Progress Notes (Signed)
Chief Complaint:   OBESITY Darlene [name] is here to discuss her progress with her obesity treatment plan along with follow-up of her obesity related diagnoses. Darlene [name] is keeping a food journal and adhering to recommended goals of 1050 calories and 80 grams of protein and states she is following her eating plan approximately 80% of the time. Darlene [name] states she is stretching/back leg kicks/squats 15 minutes 3 times per week.  Today's visit was #: 11 Starting weight: 213 lbs Starting date: 06/04/2019 Today's weight: 202 lbs Today's date: 11/13/2019 Total lbs lost to date: 11 Total lbs lost since last in-office visit: 1  Interim History: Darlene [name] is trying to lose weight for knee surgery. She must be 197 lbs to schedule. She does not journal every day. She reports meeting her protein goals. She has had COVID vaccine x2.  Subjective:   Prediabetes. Darlene [name] has a diagnosis of prediabetes based on her elevated HgA1c and was informed this puts her at greater risk of developing diabetes. She continues to work on diet and exercise to decrease her risk of diabetes. She denies nausea or hypoglycemia. No polyphagia. Darlene [name] is not on metformin.  Lab Results  Component Value Date   HGBA1C 5.7 (H) 09/17/2019   Lab Results  Component Value Date   INSULIN 17.7 09/17/2019   INSULIN 10.7 06/04/2019   Assessment/Plan:   Prediabetes. Darlene [name] will continue to work on weight loss, exercise, and decreasing simple carbohydrates to help decrease the risk of diabetes. She will continue her meal plan as directed.  Class 3 severe obesity with serious comorbidity and body mass index (BMI) of 40.0 to 44.9 in adult, unspecified obesity type (HCC).   Darlene [name] is currently in the action stage of change. As such, her goal is to continue with weight loss efforts. She has agreed to keeping a food journal and adhering to recommended goals of 1100-1150 calories and 80 grams of protein daily.   She will  increase her journaling to every day.  Exercise goals: Darlene [name] will walk for 10-15 minutes 2 times per day or join [employer].   Behavioral modification strategies: meal planning and cooking strategies, planning for success and keeping a strict food journal.  Darlene [name] has agreed to follow-up with our clinic in 2 weeks. She was informed of the importance of frequent follow-up visits to maximize her success with intensive lifestyle modifications for her multiple health conditions.   Objective:   Blood pressure 138/77, pulse 60, temperature 97.7 F (36.5 C), temperature source Oral, height 4\' 11"  (1.499 m), weight 202 lb (91.6 kg), SpO2 100 %. Body mass index is 40.8 kg/m.  General: Cooperative, alert, well developed, in no acute distress. HEENT: Conjunctivae and lids unremarkable. Cardiovascular: Regular rhythm.  Lungs: Normal work of breathing. Neurologic: No focal deficits.   Lab Results  Component Value Date   CREATININE 0.78 09/17/2019   BUN 12 09/17/2019   NA 140 09/17/2019   K 4.6 09/17/2019   CL 100 09/17/2019   CO2 25 09/17/2019   Lab Results  Component Value Date   ALT 14 09/17/2019   AST 23 09/17/2019   ALKPHOS 132 (H) 09/17/2019   BILITOT 0.2 09/17/2019   Lab Results  Component Value Date   HGBA1C 5.7 (H) 09/17/2019   HGBA1C 5.9 (H) 06/04/2019   HGBA1C 5.9 03/01/2018   HGBA1C 5.8 05/08/2017   HGBA1C 5.5 10/28/2016   Lab Results  Component Value Date   INSULIN 17.7 09/17/2019   INSULIN 10.7 06/04/2019  Lab Results  Component Value Date   TSH 1.620 06/04/2019   Lab Results  Component Value Date   CHOL 186 06/04/2019   HDL 64 06/04/2019   LDLCALC 101 (H) 06/04/2019   TRIG 120 06/04/2019   CHOLHDL 3 03/01/2018   Lab Results  Component Value Date   WBC 6.9 09/17/2019   HGB 13.3 09/17/2019   HCT 40.5 09/17/2019   MCV 82 09/17/2019   PLT 336 09/17/2019   No results found for: IRON, TIBC, FERRITIN  Obesity Behavioral Intervention  Documentation for Insurance:   Approximately 15 minutes were spent on the discussion below.  ASK: We discussed the diagnosis of obesity with Darlene [name] today and Darlene [name] agreed to give [place] permission to discuss obesity behavioral modification therapy today.  ASSESS: Darlene [name] has the diagnosis of obesity and her BMI today is 40.8. Walsie is in the action stage of change.   ADVISE: Darlene [name] was educated on the multiple health risks of obesity as well as the benefit of weight loss to improve her health. She was advised of the need for long term treatment and the importance of lifestyle modifications to improve her current health and to decrease her risk of future health problems.  AGREE: Multiple dietary modification options and treatment options were discussed and Darlene [name] agreed to follow the recommendations documented in the above note.  ARRANGE: Darlene [name] was educated on the importance of frequent visits to treat obesity as outlined per CMS and USPSTF guidelines and agreed to schedule her next follow up appointment today.  Attestation Statements:   Reviewed by clinician on day of visit: allergies, medications, problem list, medical history, surgical history, family history, social history, and previous encounter notes.  Darlene [name], am acting as Location manager for [employer], FNP   I have reviewed the above documentation for accuracy and completeness, and I agree with the above. -  Georgianne Fick, FNP

## 2019-11-28 ENCOUNTER — Ambulatory Visit (INDEPENDENT_AMBULATORY_CARE_PROVIDER_SITE_OTHER): Payer: Medicare HMO | Admitting: Family Medicine

## 2019-11-28 ENCOUNTER — Encounter (INDEPENDENT_AMBULATORY_CARE_PROVIDER_SITE_OTHER): Payer: Self-pay | Admitting: Family Medicine

## 2019-11-28 ENCOUNTER — Other Ambulatory Visit: Payer: Self-pay

## 2019-11-28 VITALS — BP 126/82 | HR 84 | Temp 98.4°F | Ht 59.0 in | Wt 200.0 lb

## 2019-11-28 DIAGNOSIS — Z6841 Body Mass Index (BMI) 40.0 and over, adult: Secondary | ICD-10-CM

## 2019-11-28 DIAGNOSIS — R7303 Prediabetes: Secondary | ICD-10-CM | POA: Diagnosis not present

## 2019-11-28 DIAGNOSIS — E559 Vitamin D deficiency, unspecified: Secondary | ICD-10-CM | POA: Diagnosis not present

## 2019-11-28 MED ORDER — VITAMIN D (ERGOCALCIFEROL) 1.25 MG (50000 UNIT) PO CAPS: 50000.0000 [IU] | ORAL_CAPSULE | ORAL | 0 refills | Status: DC

## 2019-11-28 NOTE — Progress Notes (Signed)
Chief Complaint:   OBESITY Darlene [name] is here to discuss her progress with her obesity treatment plan along with follow-up of her obesity related diagnoses. Darlene [name] is keeping a food journal and adhering to recommended goals of 1100 calories and 75-80 grams of protein and states she is following her eating plan approximately % of the time. Darlene [name] states she is weightlifting/stretching 20 minutes 3-4 times per week.  Today's visit was #: 12 Starting weight: 213 lbs Starting date: 06/04/2019 Today's weight: 200 lbs Today's date: 11/28/2019 Total lbs lost to date: 13 Total lbs lost since last in-office visit: 2  Interim History: Darlene [name] hopes to schedule her knee surgery soon (within a few weeks). She must be 197 lbs for surgery.She is 200 lbs today.  She has increased her exercise. She states she is journaling daily and meeting her protein and calorie goals.   Subjective:   Prediabetes. Darlene [name] has a diagnosis of prediabetes based on her elevated HgA1c and was informed this puts her at greater risk of developing diabetes. She continues to work on diet and exercise to decrease her risk of diabetes.  Darlene [name] is not on metformin. No polyphagia.  Lab Results  Component Value Date   HGBA1C 5.7 (H) 09/17/2019   Lab Results  Component Value Date   INSULIN 17.7 09/17/2019   INSULIN 10.7 06/04/2019   Vitamin D deficiency. Last Vitamin D level not at goal - 46.7 on 09/17/2019. Darlene [name] is on prescription Vitamin D.  Assessment/Plan:   Prediabetes. [name] will continue to work on weight loss, exercise, and decreasing simple carbohydrates to help decrease the risk of diabetes. She will continue her meal plan.  Vitamin D deficiency. Low Vitamin D level contributes to fatigue and are associated with obesity, breast, and colon cancer. She was given a prescription for Vitamin D, Ergocalciferol, (DRISDOL) 1.25 MG (50000 UNIT) CAPS capsule every week #4 with 0 refills and will  follow-up for routine testing of Vitamin D, at least 2-3 times per year to avoid over-replacement.   Class 3 severe obesity with serious comorbidity and body mass index (BMI) of 40.0 to 44.9 in adult, unspecified obesity type [facility])  Darlene [name] is currently in the action stage of change. As such, her goal is to continue with weight loss efforts. She has agreed to keeping a food journal and adhering to recommended goals of 1100-1150 calories and 80 grams of protein daily.   Exercise goals: Darlene [name] will continue her current exercise regimen.  Behavioral modification strategies: keeping a strict food journal.  Darlene [name] has agreed to follow-up with our clinic in 3 weeks. She was informed of the importance of frequent follow-up visits to maximize her success with intensive lifestyle modifications for her multiple health conditions.   Objective:   Blood pressure 126/82, pulse 84, temperature 98.4 F (36.9 C), temperature source Oral, height 4\' 11"  (1.499 m), weight 200 lb (90.7 kg), SpO2 98 %. Body mass index is 40.4 kg/m.  General: Cooperative, alert, well developed, in no acute distress. HEENT: Conjunctivae and lids unremarkable. Cardiovascular: Regular rhythm.  Lungs: Normal work of breathing. Neurologic: No focal deficits.   Lab Results  Component Value Date   CREATININE 0.78 09/17/2019   BUN 12 09/17/2019   NA 140 09/17/2019   K 4.6 09/17/2019   CL 100 09/17/2019   CO2 25 09/17/2019   Lab Results  Component Value Date   ALT 14 09/17/2019   AST 23 09/17/2019   ALKPHOS 132 (H) 09/17/2019   BILITOT  0.2 09/17/2019   Lab Results  Component Value Date   HGBA1C 5.7 (H) 09/17/2019   HGBA1C 5.9 (H) 06/04/2019   HGBA1C 5.9 03/01/2018   HGBA1C 5.8 05/08/2017   HGBA1C 5.5 10/28/2016   Lab Results  Component Value Date   INSULIN 17.7 09/17/2019   INSULIN 10.7 06/04/2019   Lab Results  Component Value Date   TSH 1.620 06/04/2019   Lab Results  Component Value Date   CHOL 186  06/04/2019   HDL 64 06/04/2019   LDLCALC 101 (H) 06/04/2019   TRIG 120 06/04/2019   CHOLHDL 3 03/01/2018   Lab Results  Component Value Date   WBC 6.9 09/17/2019   HGB 13.3 09/17/2019   HCT 40.5 09/17/2019   MCV 82 09/17/2019   PLT 336 09/17/2019   No results found for: IRON, TIBC, FERRITIN  Obesity Behavioral Intervention Documentation for Insurance:   Approximately 15 minutes were spent on the discussion below.  ASK: We discussed the diagnosis of obesity with Darlene [name] today and [name] agreed to give [place] permission to discuss obesity behavioral modification therapy today.  ASSESS: Darlene [name] has the diagnosis of obesity and her BMI today is 40.5. Darlene [name] is in the action stage of change.   ADVISE: Darlene [name] was educated on the multiple health risks of obesity as well as the benefit of weight loss to improve her health. She was advised of the need for long term treatment and the importance of lifestyle modifications to improve her current health and to decrease her risk of future health problems.  AGREE: Multiple dietary modification options and treatment options were discussed and Darlene [name] agreed to follow the recommendations documented in the above note.  ARRANGE: Darlene [name] was educated on the importance of frequent visits to treat obesity as outlined per CMS and USPSTF guidelines and agreed to schedule her next follow up appointment today.  Attestation Statements:   Reviewed by clinician on day of visit: allergies, medications, problem list, medical history, surgical history, family history, social history, and previous encounter notes.  IMarianna Payment, am acting as Energy manager for [place], FNP   I have reviewed the above documentation for accuracy and completeness, and I agree with the above. -  Jesse Sans, FNP

## 2019-12-16 ENCOUNTER — Other Ambulatory Visit: Payer: Self-pay

## 2019-12-16 ENCOUNTER — Encounter (INDEPENDENT_AMBULATORY_CARE_PROVIDER_SITE_OTHER): Payer: Self-pay | Admitting: Family Medicine

## 2019-12-16 ENCOUNTER — Ambulatory Visit (INDEPENDENT_AMBULATORY_CARE_PROVIDER_SITE_OTHER): Payer: Medicare HMO | Admitting: Family Medicine

## 2019-12-16 VITALS — BP 120/80 | HR 85 | Temp 98.1°F | Ht 59.0 in | Wt 197.0 lb

## 2019-12-16 DIAGNOSIS — E559 Vitamin D deficiency, unspecified: Secondary | ICD-10-CM | POA: Diagnosis not present

## 2019-12-16 DIAGNOSIS — Z6839 Body mass index (BMI) 39.0-39.9, adult: Secondary | ICD-10-CM | POA: Diagnosis not present

## 2019-12-16 DIAGNOSIS — J3489 Other specified disorders of nose and nasal sinuses: Secondary | ICD-10-CM

## 2019-12-16 DIAGNOSIS — R7303 Prediabetes: Secondary | ICD-10-CM

## 2019-12-16 MED ORDER — VITAMIN D (ERGOCALCIFEROL) 1.25 MG (50000 UNIT) PO CAPS: 50000.0000 [IU] | ORAL_CAPSULE | ORAL | 0 refills | Status: DC

## 2019-12-16 NOTE — Progress Notes (Signed)
Chief Complaint:   OBESITY Darlene [name] is here to discuss her progress with her obesity treatment plan along with follow-up of her obesity related diagnoses. Darlene [name] is on keeping a food journal and adhering to recommended goals of 1100-1150 calories and 80 grams of protein daily and states she is following her eating plan approximately 100% of the time. Darlene [name] states she is doing squats, back leg kicks, and walking for 15 minutes 3 times per week.  Today's visit was #: 13 Starting weight: 213 lbs Starting date: 06/04/2019 Today's weight: 197 lbs Today's date: 12/16/2019 Total lbs lost to date: 16 Total lbs lost since last in-office visit: 3  Interim History: Darlene [name] has a left total knee replacement scheduled for April 1st. She had to lose down to 197 lbs to get the surgery and she is at 197 lbs today. She is journaling daily and meeting her protein and calorie goals. She has worked very hard to lose weight for her knee surgery. She plans on continuing weight loss after her surgery.  Subjective:   1. Vitamin D deficiency Darlene [name]'s Vit D is nearly at goal (46 at her last check). She is on weekly prescription Vit D.  2. Pre-diabetes Darlene [name] is not on metformin and she denies polyphagia. Lab Results  Component Value Date   HGBA1C 5.7 (H) 09/17/2019    3. Rhinorrhea Darlene [name] reports nasal drainage, clear to green yellow. She notes post nasal drip, and she takes Flonase daily for seasonal allergies. She wonders if a reaction to milk products could be causing it.  Assessment/Plan:   1. Vitamin D deficiency Low Vitamin D level contributes to fatigue and are associated with obesity, breast, and colon cancer. We will refill prescription Vitamin D for 1 month. She will follow-up for routine testing of Vitamin D, at least 2-3 times per year to avoid over-replacement.  - Vitamin D, Ergocalciferol, (DRISDOL) 1.25 MG (50000 UNIT) CAPS capsule; Take 1 capsule (50,000 Units total) by mouth every  7 (seven) days.  Dispense: 4 capsule; Refill: 0  2. Pre-diabetes Rynn will continue her meal plan, and will continue to work on weight loss, exercise, and decreasing simple carbohydrates to help decrease the risk of diabetes.   3. Rhinorrhea Darlene [name] is to stop milk products for a few days to see if nasal drainage improves. She may also add Zyrtec daily if discontinuing milk products does not help. She will continue Flonase daily.  4. Class 2 severe obesity with serious comorbidity and body mass index (BMI) of 39.0 to 39.9 in adult, unspecified obesity type [facility]) Darlene [name] is currently in the action stage of change. As such, her goal is to continue with weight loss efforts. She has agreed to keeping a food journal and adhering to recommended goals of 1100-1150 calories and 80 grams of protein daily.   Exercise goals: As is.  Behavioral modification strategies: keeping a strict food journal.  Darlene [name] has agreed to follow-up with our clinic in 4 weeks. She was informed of the importance of frequent follow-up visits to maximize her success with intensive lifestyle modifications for her multiple health conditions.   Objective:   Blood pressure 120/80, pulse 85, temperature 98.1 F (36.7 C), temperature source Oral, height 4\' 11"  (1.499 m), weight 197 lb (89.4 kg), SpO2 99 %. Body mass index is 39.79 kg/m.  General: Cooperative, alert, well developed, in no acute distress. HEENT: Conjunctivae and lids unremarkable. Cardiovascular: Regular rhythm.  Lungs: Normal work of breathing. Neurologic: No focal deficits.  Lab Results  Component Value Date   CREATININE 0.78 09/17/2019   BUN 12 09/17/2019   NA 140 09/17/2019   K 4.6 09/17/2019   CL 100 09/17/2019   CO2 25 09/17/2019   Lab Results  Component Value Date   ALT 14 09/17/2019   AST 23 09/17/2019   ALKPHOS 132 (H) 09/17/2019   BILITOT 0.2 09/17/2019   Lab Results  Component Value Date   HGBA1C 5.7 (H) 09/17/2019   HGBA1C  5.9 (H) 06/04/2019   HGBA1C 5.9 03/01/2018   HGBA1C 5.8 05/08/2017   HGBA1C 5.5 10/28/2016   Lab Results  Component Value Date   INSULIN 17.7 09/17/2019   INSULIN 10.7 06/04/2019   Lab Results  Component Value Date   TSH 1.620 06/04/2019   Lab Results  Component Value Date   CHOL 186 06/04/2019   HDL 64 06/04/2019   LDLCALC 101 (H) 06/04/2019   TRIG 120 06/04/2019   CHOLHDL 3 03/01/2018   Lab Results  Component Value Date   WBC 6.9 09/17/2019   HGB 13.3 09/17/2019   HCT 40.5 09/17/2019   MCV 82 09/17/2019   PLT 336 09/17/2019   No results found for: IRON, TIBC, FERRITIN  Obesity Behavioral Intervention Documentation for Insurance:   Approximately 15 minutes were spent on the discussion below.  ASK: We discussed the diagnosis of obesity with [name] today and Sharday agreed to give [place] permission to discuss obesity behavioral modification therapy today.  ASSESS: Darlene [name] has the diagnosis of obesity and her BMI today is 39.77. Pamala is in the action stage of change.   ADVISE: Conya was educated on the multiple health risks of obesity as well as the benefit of weight loss to improve her health. She was advised of the need for long term treatment and the importance of lifestyle modifications to improve her current health and to decrease her risk of future health problems.  AGREE: Multiple dietary modification options and treatment options were discussed and Kacee agreed to follow the recommendations documented in the above note.  ARRANGE: Teresina was educated on the importance of frequent visits to treat obesity as outlined per CMS and USPSTF guidelines and agreed to schedule her next follow up appointment today.  Attestation Statements:   Reviewed by clinician on day of visit: allergies, medications, problem list, medical history, surgical history, family history, social history, and previous encounter notes.   [name], am acting as Energy manager  for [place], FNP-C.  I have reviewed the above documentation for accuracy and completeness, and I agree with the above. - Jesse Sans, FNP

## 2019-12-18 ENCOUNTER — Encounter (INDEPENDENT_AMBULATORY_CARE_PROVIDER_SITE_OTHER): Payer: Self-pay | Admitting: Family Medicine

## 2019-12-19 ENCOUNTER — Observation Stay (HOSPITAL_COMMUNITY)
Admission: EM | Admit: 2019-12-19 | Discharge: 2019-12-22 | Disposition: A | Discharge status: Home / Self Care | Payer: Medicare HMO | Attending: Internal Medicine | Admitting: Internal Medicine

## 2019-12-19 ENCOUNTER — Encounter (HOSPITAL_COMMUNITY): Payer: Self-pay | Admitting: Emergency Medicine

## 2019-12-19 ENCOUNTER — Emergency Department (HOSPITAL_COMMUNITY): Payer: Medicare HMO

## 2019-12-19 DIAGNOSIS — M170 Bilateral primary osteoarthritis of knee: Secondary | ICD-10-CM | POA: Diagnosis not present

## 2019-12-19 DIAGNOSIS — M25461 Effusion, right knee: Secondary | ICD-10-CM | POA: Diagnosis not present

## 2019-12-19 DIAGNOSIS — M25571 Pain in right ankle and joints of right foot: Secondary | ICD-10-CM | POA: Insufficient documentation

## 2019-12-19 DIAGNOSIS — R109 Unspecified abdominal pain: Secondary | ICD-10-CM | POA: Insufficient documentation

## 2019-12-19 DIAGNOSIS — M25522 Pain in left elbow: Secondary | ICD-10-CM | POA: Insufficient documentation

## 2019-12-19 DIAGNOSIS — M25561 Pain in right knee: Principal | ICD-10-CM | POA: Insufficient documentation

## 2019-12-19 DIAGNOSIS — E669 Obesity, unspecified: Secondary | ICD-10-CM | POA: Diagnosis not present

## 2019-12-19 DIAGNOSIS — R0789 Other chest pain: Secondary | ICD-10-CM | POA: Diagnosis not present

## 2019-12-19 DIAGNOSIS — Z79899 Other long term (current) drug therapy: Secondary | ICD-10-CM | POA: Diagnosis not present

## 2019-12-19 DIAGNOSIS — M25531 Pain in right wrist: Secondary | ICD-10-CM | POA: Diagnosis not present

## 2019-12-19 DIAGNOSIS — R519 Headache, unspecified: Secondary | ICD-10-CM | POA: Insufficient documentation

## 2019-12-19 DIAGNOSIS — Z20822 Contact with and (suspected) exposure to covid-19: Secondary | ICD-10-CM | POA: Insufficient documentation

## 2019-12-19 DIAGNOSIS — E876 Hypokalemia: Secondary | ICD-10-CM

## 2019-12-19 DIAGNOSIS — R079 Chest pain, unspecified: Secondary | ICD-10-CM

## 2019-12-19 DIAGNOSIS — I10 Essential (primary) hypertension: Secondary | ICD-10-CM

## 2019-12-19 DIAGNOSIS — T07XXXA Unspecified multiple injuries, initial encounter: Secondary | ICD-10-CM

## 2019-12-19 DIAGNOSIS — R7303 Prediabetes: Secondary | ICD-10-CM | POA: Insufficient documentation

## 2019-12-19 DIAGNOSIS — S6991XA Unspecified injury of right wrist, hand and finger(s), initial encounter: Secondary | ICD-10-CM

## 2019-12-19 DIAGNOSIS — M25512 Pain in left shoulder: Secondary | ICD-10-CM | POA: Diagnosis not present

## 2019-12-19 DIAGNOSIS — Z6839 Body mass index (BMI) 39.0-39.9, adult: Secondary | ICD-10-CM | POA: Diagnosis not present

## 2019-12-19 LAB — BASIC METABOLIC PANEL
Anion gap: 17 — ABNORMAL HIGH (ref 5–15)
BUN: 14 mg/dL (ref 8–23)
CO2: 22 mmol/L (ref 22–32)
Calcium: 10.3 mg/dL (ref 8.9–10.3)
Chloride: 102 mmol/L (ref 98–111)
Creatinine, Ser: 0.78 mg/dL (ref 0.44–1.00)
GFR calc Af Amer: >60 mL/min (ref >60)
GFR calc non Af Amer: >60 mL/min (ref >60)
Glucose, Bld: 108 mg/dL — ABNORMAL HIGH (ref 70–99)
Potassium: 3.3 mmol/L — ABNORMAL LOW (ref 3.5–5.1)
Sodium: 141 mmol/L (ref 135–145)

## 2019-12-19 LAB — CBC
HCT: 44.4 % (ref 36.0–46.0)
Hemoglobin: 13.7 g/dL (ref 12.0–15.0)
MCH: 26.8 pg (ref 26.0–34.0)
MCHC: 30.9 g/dL (ref 30.0–36.0)
MCV: 86.7 fL (ref 80.0–100.0)
Platelets: 426 10*3/uL — ABNORMAL HIGH (ref 150–400)
RBC: 5.12 MIL/uL — ABNORMAL HIGH (ref 3.87–5.11)
RDW: 13.7 % (ref 11.5–15.5)
WBC: 14.2 10*3/uL — ABNORMAL HIGH (ref 4.0–10.5)
nRBC: 0 % (ref 0.0–0.2)

## 2019-12-19 MED ORDER — SODIUM CHLORIDE 0.9 % IV SOLN: INTRAVENOUS | Status: DC

## 2019-12-19 MED ORDER — ONDANSETRON HCL 4 MG/2ML IJ SOLN: 4.0000 mg | Freq: Four times a day (QID) | INTRAMUSCULAR | Status: DC | PRN

## 2019-12-19 MED ORDER — IOHEXOL 300 MG/ML  SOLN
100.0000 mL | Freq: Once | INTRAMUSCULAR | Status: AC | PRN
  Administered 2019-12-19: 100 mL via INTRAVENOUS

## 2019-12-19 MED ORDER — HYDROCODONE-ACETAMINOPHEN 5-325 MG PO TABS: 1.0000 | ORAL_TABLET | ORAL | Status: DC | PRN

## 2019-12-19 MED ORDER — TRAMADOL HCL 50 MG PO TABS: 50.0000 mg | ORAL_TABLET | Freq: Four times a day (QID) | ORAL | 0 refills | Status: DC | PRN

## 2019-12-19 MED ORDER — POTASSIUM CHLORIDE CRYS ER 20 MEQ PO TBCR
20.0000 meq | EXTENDED_RELEASE_TABLET | Freq: Once | ORAL | Status: AC
  Administered 2019-12-20: 20 meq via ORAL
  Filled 2019-12-19: qty 1

## 2019-12-19 MED ORDER — FENTANYL CITRATE (PF) 100 MCG/2ML IJ SOLN
12.5000 ug | Freq: Once | INTRAMUSCULAR | Status: DC
  Filled 2019-12-19 (×2): qty 2

## 2019-12-19 MED ORDER — ONDANSETRON HCL 4 MG/2ML IJ SOLN
4.0000 mg | Freq: Once | INTRAMUSCULAR | Status: DC
  Filled 2019-12-19 (×2): qty 2

## 2019-12-19 MED ORDER — ACETAMINOPHEN 325 MG PO TABS: 650.0000 mg | ORAL_TABLET | Freq: Four times a day (QID) | ORAL | Status: DC | PRN

## 2019-12-19 MED ORDER — FENTANYL CITRATE (PF) 100 MCG/2ML IJ SOLN
25.0000 ug | Freq: Once | INTRAMUSCULAR | Status: AC
  Administered 2019-12-19: 25 ug via INTRAVENOUS
  Filled 2019-12-19: qty 2
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
## 2019-12-19 NOTE — Telephone Encounter (Signed)
Please Advise

## 2019-12-19 NOTE — Discharge Instructions (Signed)
Wear the splint on the right wrist.  No evidence of any bony injury but it is an area where there can be a fracture to one of the wrist bones is difficult to see at this time.  So you need to make an appointment to follow-up with a hand surgeon information provided above.  Is one of our plastic surgeons tonight.  Call and make an appointment.  Take the tramadol as needed for pain.
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
## 2019-12-20 ENCOUNTER — Encounter (HOSPITAL_COMMUNITY): Payer: Self-pay | Admitting: Family Medicine

## 2019-12-20 ENCOUNTER — Other Ambulatory Visit: Payer: Self-pay

## 2019-12-20 DIAGNOSIS — M25461 Effusion, right knee: Secondary | ICD-10-CM

## 2019-12-20 DIAGNOSIS — M25561 Pain in right knee: Secondary | ICD-10-CM

## 2019-12-20 DIAGNOSIS — S6991XA Unspecified injury of right wrist, hand and finger(s), initial encounter: Secondary | ICD-10-CM | POA: Insufficient documentation

## 2019-12-20 LAB — BASIC METABOLIC PANEL
Anion gap: 14 (ref 5–15)
BUN: 10 mg/dL (ref 8–23)
CO2: 23 mmol/L (ref 22–32)
Calcium: 9.7 mg/dL (ref 8.9–10.3)
Chloride: 103 mmol/L (ref 98–111)
Creatinine, Ser: 0.81 mg/dL (ref 0.44–1.00)
GFR calc Af Amer: >60 mL/min (ref >60)
GFR calc non Af Amer: >60 mL/min (ref >60)
Glucose, Bld: 114 mg/dL — ABNORMAL HIGH (ref 70–99)
Potassium: 2.9 mmol/L — ABNORMAL LOW (ref 3.5–5.1)
Sodium: 140 mmol/L (ref 135–145)

## 2019-12-20 LAB — CBC WITH DIFFERENTIAL/PLATELET
Abs Immature Granulocytes: 0.04 10*3/uL (ref 0.00–0.07)
Basophils Absolute: 0.1 10*3/uL (ref 0.0–0.1)
Basophils Relative: 1 %
Eosinophils Absolute: 0 10*3/uL (ref 0.0–0.5)
Eosinophils Relative: 0 %
HCT: 40 % (ref 36.0–46.0)
Hemoglobin: 12.5 g/dL (ref 12.0–15.0)
Immature Granulocytes: 0 %
Lymphocytes Relative: 28 %
Lymphs Abs: 2.9 10*3/uL (ref 0.7–4.0)
MCH: 26.7 pg (ref 26.0–34.0)
MCHC: 31.3 g/dL (ref 30.0–36.0)
MCV: 85.5 fL (ref 80.0–100.0)
Monocytes Absolute: 0.9 10*3/uL (ref 0.1–1.0)
Monocytes Relative: 9 %
Neutro Abs: 6.6 10*3/uL (ref 1.7–7.7)
Neutrophils Relative %: 62 %
Platelets: 425 10*3/uL — ABNORMAL HIGH (ref 150–400)
RBC: 4.68 MIL/uL (ref 3.87–5.11)
RDW: 13.9 % (ref 11.5–15.5)
WBC: 10.5 10*3/uL (ref 4.0–10.5)
nRBC: 0 % (ref 0.0–0.2)

## 2019-12-20 LAB — HIV ANTIBODY (ROUTINE TESTING W REFLEX): HIV Screen 4th Generation wRfx: NONREACTIVE

## 2019-12-20 LAB — MAGNESIUM: Magnesium: 1.6 mg/dL — ABNORMAL LOW (ref 1.7–2.4)

## 2019-12-20 LAB — SARS CORONAVIRUS 2 (TAT 6-24 HRS): SARS Coronavirus 2: NEGATIVE

## 2019-12-20 MED ORDER — ACETAMINOPHEN 650 MG RE SUPP: 650.0000 mg | Freq: Four times a day (QID) | RECTAL | Status: DC | PRN

## 2019-12-20 MED ORDER — ACETAMINOPHEN 325 MG PO TABS: 650.0000 mg | ORAL_TABLET | Freq: Four times a day (QID) | ORAL | Status: DC | PRN

## 2019-12-20 MED ORDER — POLYETHYLENE GLYCOL 3350 17 G PO PACK: 17.0000 g | PACK | Freq: Every day | ORAL | Status: DC | PRN

## 2019-12-20 MED ORDER — MAGNESIUM SULFATE 2 GM/50ML IV SOLN
2.0000 g | Freq: Once | INTRAVENOUS | Status: AC
  Administered 2019-12-20: 16:00:00 2 g via INTRAVENOUS
  Filled 2019-12-20: qty 50

## 2019-12-20 MED ORDER — POTASSIUM CHLORIDE CRYS ER 20 MEQ PO TBCR
40.0000 meq | EXTENDED_RELEASE_TABLET | ORAL | Status: AC
  Administered 2019-12-20 (×2): 40 meq via ORAL
  Filled 2019-12-20 (×2): qty 2

## 2019-12-20 MED ORDER — METHOCARBAMOL 500 MG PO TABS
500.0000 mg | ORAL_TABLET | Freq: Three times a day (TID) | ORAL | Status: DC
  Administered 2019-12-20 – 2019-12-22 (×7): 500 mg via ORAL
  Filled 2019-12-20 (×7): qty 1

## 2019-12-20 MED ORDER — SODIUM CHLORIDE 0.9 % IV SOLN: INTRAVENOUS | Status: DC

## 2019-12-20 MED ORDER — OXYCODONE HCL 5 MG PO TABS
5.0000 mg | ORAL_TABLET | Freq: Four times a day (QID) | ORAL | Status: DC | PRN
  Administered 2019-12-21 (×2): 5 mg via ORAL
  Filled 2019-12-20 (×2): qty 1

## 2019-12-20 MED ORDER — KETOROLAC TROMETHAMINE 15 MG/ML IJ SOLN
15.0000 mg | Freq: Four times a day (QID) | INTRAMUSCULAR | Status: DC | PRN
  Administered 2019-12-20 – 2019-12-21 (×3): 15 mg via INTRAVENOUS
  Filled 2019-12-20 (×3): qty 1

## 2019-12-20 MED ORDER — ACETAMINOPHEN 500 MG PO TABS
1000.0000 mg | ORAL_TABLET | Freq: Three times a day (TID) | ORAL | Status: DC
  Administered 2019-12-20 – 2019-12-22 (×6): 1000 mg via ORAL
  Filled 2019-12-20 (×6): qty 2

## 2019-12-20 MED ORDER — HYDROCODONE-ACETAMINOPHEN 5-325 MG PO TABS
1.0000 | ORAL_TABLET | ORAL | Status: DC | PRN
  Administered 2019-12-20: 1 via ORAL
  Filled 2019-12-20: qty 2
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
## 2019-12-20 NOTE — Progress Notes (Signed)
PT Cancellation Note  Patient Details Name: Darlene [name] MRN: [id number] DOB: April 07, 1951   Cancelled Treatment:    Reason Eval/Treat Not Completed: Other (comment)(await clarification of weight bearing orders and splint/KI wear prior to eval)   Keng Jewel B Marielouise Amey 12/20/2019, 7:57 AM  Merryl Hacker, PT Acute Rehabilitation Services Pager: [phone] Office: [phone]

## 2019-12-20 NOTE — Progress Notes (Signed)
Orthopedic Tech Progress Note Patient Details:  Chanin Frumkin [facility] Jul 01, 1951 [id number]  Ortho Devices Type of Ortho Device: Knee Immobilizer, Long leg splint Ortho Device/Splint Location: RLE Ortho Device/Splint Interventions: Ordered, Application, Adjustment   Post Interventions Patient Tolerated: Well Instructions Provided: Care of device   Ancil Linsey 12/20/2019, 1:13 AM

## 2019-12-20 NOTE — ED Notes (Signed)
Pt wheeled to restroom via stretcher. Pt tearful d/t pain and inability to perform ADLs. Pt requesting assistance, slow to move to toilet. Shuffling in restroom. Pt aware of call bell in reach and informed to ask for assistance when needed.
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
## 2019-12-20 NOTE — ED Notes (Signed)
MS   Breakfast ordered  

## 2019-12-20 NOTE — Progress Notes (Signed)
Orthopedic Tech Progress Note Patient Details:  Darlene [name] [facility] 11-17-1950 [id number] Nurse called at 1:40am on 12/20/19 for arm sling and said the order had just been put in.  Patient ID: Darlene [name], female   DOB: 05-31-1951, 69 y.o.   MRN: [id number]   Ancil Linsey 12/20/2019, 2:09 AM

## 2019-12-21 DIAGNOSIS — M25561 Pain in right knee: Secondary | ICD-10-CM | POA: Diagnosis not present

## 2019-12-21 DIAGNOSIS — M25461 Effusion, right knee: Secondary | ICD-10-CM | POA: Diagnosis not present

## 2019-12-21 LAB — BASIC METABOLIC PANEL
Anion gap: 10 (ref 5–15)
BUN: 8 mg/dL (ref 8–23)
CO2: 24 mmol/L (ref 22–32)
Calcium: 9.2 mg/dL (ref 8.9–10.3)
Chloride: 106 mmol/L (ref 98–111)
Creatinine, Ser: 0.75 mg/dL (ref 0.44–1.00)
GFR calc Af Amer: >60 mL/min (ref >60)
GFR calc non Af Amer: >60 mL/min (ref >60)
Glucose, Bld: 151 mg/dL — ABNORMAL HIGH (ref 70–99)
Potassium: 3.6 mmol/L (ref 3.5–5.1)
Sodium: 140 mmol/L (ref 135–145)

## 2019-12-21 LAB — CBC
HCT: 40.3 % (ref 36.0–46.0)
Hemoglobin: 12.3 g/dL (ref 12.0–15.0)
MCH: 26.5 pg (ref 26.0–34.0)
MCHC: 30.5 g/dL (ref 30.0–36.0)
MCV: 86.7 fL (ref 80.0–100.0)
Platelets: 395 10*3/uL (ref 150–400)
RBC: 4.65 MIL/uL (ref 3.87–5.11)
RDW: 14.2 % (ref 11.5–15.5)
WBC: 7.1 10*3/uL (ref 4.0–10.5)
nRBC: 0 % (ref 0.0–0.2)

## 2019-12-21 MED ORDER — DOCUSATE SODIUM 100 MG PO CAPS
100.0000 mg | ORAL_CAPSULE | Freq: Every day | ORAL | Status: DC
  Administered 2019-12-21 – 2019-12-22 (×2): 100 mg via ORAL
  Filled 2019-12-21: qty 1

## 2019-12-21 MED ORDER — BISACODYL 10 MG RE SUPP: 10.0000 mg | Freq: Every day | RECTAL | Status: DC | PRN

## 2019-12-21 NOTE — Plan of Care (Signed)
  Problem: Education: Goal: Knowledge of General Education information will improve Description: Including pain rating scale, medication(s)/side effects and non-pharmacologic comfort measures Outcome: Progressing   Problem: Pain Managment: Goal: General experience of comfort will improve Outcome: Progressing   Problem: Safety: Goal: Ability to remain free from injury will improve Outcome: Progressing   

## 2019-12-21 NOTE — Plan of Care (Signed)
  Problem: Education: Goal: Knowledge of General Education information will improve Description Including pain rating scale, medication(s)/side effects and non-pharmacologic comfort measures Outcome: Progressing   
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
## 2019-12-21 NOTE — Plan of Care (Signed)
Problem: Education: Goal: Knowledge of General Education information will improve Description Including pain rating scale, medication(s)/side effects and non-pharmacologic comfort measures Outcome: Progressing   Problem: Clinical Measurements: Goal: Will remain free from infection Outcome: Progressing   Problem: Activity: Goal: Risk for activity intolerance will decrease Outcome: Progressing   Problem: Skin Integrity: Goal: Risk for impaired skin integrity will decrease Outcome: Progressing   
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
## 2019-12-22 DIAGNOSIS — M25461 Effusion, right knee: Secondary | ICD-10-CM | POA: Diagnosis not present

## 2019-12-22 DIAGNOSIS — M25561 Pain in right knee: Secondary | ICD-10-CM | POA: Diagnosis not present

## 2019-12-22 MED ORDER — OXYCODONE HCL 5 MG PO TABS: 5.0000 mg | ORAL_TABLET | Freq: Four times a day (QID) | ORAL | 0 refills | Status: DC | PRN

## 2019-12-22 MED ORDER — DOCUSATE SODIUM 100 MG PO CAPS: 100.0000 mg | ORAL_CAPSULE | Freq: Every day | ORAL | 0 refills | Status: DC

## 2019-12-22 MED ORDER — ACETAMINOPHEN 500 MG PO TABS: 1000.0000 mg | ORAL_TABLET | Freq: Three times a day (TID) | ORAL | 0 refills | Status: DC

## 2019-12-22 MED ORDER — ALUM & MAG HYDROXIDE-SIMETH 200-200-20 MG/5ML PO SUSP
30.0000 mL | Freq: Four times a day (QID) | ORAL | Status: DC | PRN
  Administered 2019-12-22: 30 mL via ORAL
  Filled 2019-12-22: qty 30

## 2019-12-22 MED ORDER — METHOCARBAMOL 500 MG PO TABS: 500.0000 mg | ORAL_TABLET | Freq: Three times a day (TID) | ORAL | 0 refills | Status: DC
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
## 2019-12-22 NOTE — TOC Transition Note (Signed)
Transition of Care [facility]) - CM/SW Discharge Note   Patient Details  Name: Elani Delph MRN: [id number] Date of Birth: May 11, 1951  Transition of Care [facility]) CM/SW Contact:  Deveron Furlong, RN 12/22/2019, 2:51 PM   Clinical Narrative:    Patient to d/c home with family today.  Patient requested to changed [facility] agency to [facility] based on Medicare ratings.  Kandee Keen with [facility] accepted referral.  Patient received DME.   Final next level of care: Home w Home Health Services Barriers to Discharge: Continued Medical Work up   Patient Goals and CMS Choice Patient states their goals for this hospitalization and ongoing recovery are:: to get better CMS Medicare.gov Compare Post Acute Care list provided to:: Patient Choice offered to / list presented to : Patient   Discharge Plan and Services   Discharge Planning Services: CM Consult            DME Arranged: 3-N-1, Walker rolling   Date DME Agency Contacted: 12/20/19 Time DME Agency Contacted: 1055 Representative spoke with at DME Agency: [facility] HH Arranged: PT, OT HH Agency: [facility] Date [facility] Agency Contacted: 12/22/19 Time HH Agency Contacted: 1436 Representative spoke with at [facility] Agency: Kandee Keen

## 2019-12-22 NOTE — Progress Notes (Signed)
Discharge instructions provided.  Pt verbalizes understanding of all instructions and follow-up care.  Pt transported down to car via wheelchair by RN and NT with DME.  Pt discharged to home at 1440 on 12/22/19.

## 2019-12-24 ENCOUNTER — Telehealth: Payer: Self-pay | Admitting: Family Medicine

## 2019-12-24 NOTE — Telephone Encounter (Signed)
FYI

## 2019-12-24 NOTE — Telephone Encounter (Signed)
Darlene [name] with [facility] calling to update you on pt's start of care. Pt's start of care has been delayed until December 26, 2019 per the clients request and pt refused care yesterday. Pt stated she had doctors appts today and tomorrow . Thanks

## 2019-12-26 ENCOUNTER — Telehealth: Payer: Self-pay | Admitting: Family Medicine

## 2019-12-26 NOTE — Telephone Encounter (Signed)
Darlene [name] with [facility] need verbal order to continue PT for 1 week for 1 week 2 week for 3 week and 1 week for 4 weeks. And a order for Home Health Aid to assist the pt with bath and shower due to the pt is having a lot of problem with getting up to get in the shower alone.  He is aware that the provider is out of the office and we will be closed on Friday returning on Monday 12/30/2019.

## 2019-12-30 NOTE — Telephone Encounter (Signed)
It is okay to give verbal authorization to requested services. Thanks, BJ 

## 2019-12-30 NOTE — Telephone Encounter (Signed)
Spoke with Bhavin from [facility] and gave verbal orders per Dr. [place].

## 2020-01-03 DIAGNOSIS — F0781 Postconcussional syndrome: Secondary | ICD-10-CM | POA: Insufficient documentation

## 2020-01-03 DIAGNOSIS — M542 Cervicalgia: Secondary | ICD-10-CM | POA: Insufficient documentation

## 2020-01-03 DIAGNOSIS — M5030 Other cervical disc degeneration, unspecified cervical region: Secondary | ICD-10-CM | POA: Insufficient documentation

## 2020-01-03 DIAGNOSIS — M545 Low back pain, unspecified: Secondary | ICD-10-CM | POA: Insufficient documentation

## 2020-01-06 ENCOUNTER — Ambulatory Visit (INDEPENDENT_AMBULATORY_CARE_PROVIDER_SITE_OTHER): Payer: Medicare HMO

## 2020-01-06 ENCOUNTER — Other Ambulatory Visit: Payer: Self-pay

## 2020-01-06 ENCOUNTER — Ambulatory Visit (INDEPENDENT_AMBULATORY_CARE_PROVIDER_SITE_OTHER): Payer: Medicare HMO | Admitting: Podiatrist

## 2020-01-06 DIAGNOSIS — S93601A Unspecified sprain of right foot, initial encounter: Secondary | ICD-10-CM

## 2020-01-06 DIAGNOSIS — G90521 Complex regional pain syndrome I of right lower limb: Secondary | ICD-10-CM

## 2020-01-06 DIAGNOSIS — M79671 Pain in right foot: Secondary | ICD-10-CM

## 2020-01-06 DIAGNOSIS — M25571 Pain in right ankle and joints of right foot: Secondary | ICD-10-CM | POA: Diagnosis not present

## 2020-01-06 MED ORDER — NONFORMULARY OR COMPOUNDED ITEM: Status: DC

## 2020-01-06 NOTE — Patient Instructions (Signed)
We will set you up for physical therapy-  They will call to get the appointment scheduled.  If you don't hear about a neurology consult- please let me know and I can set it up for you.   Complex Regional Pain Syndrome Complex regional pain syndrome (CRPS) is a nerve disorder that causes long-term (chronic) pain. This is usually in a hand, arm, foot, or leg. CRPS usually occurs after an injury or trauma, such as a fracture or sprain. There are two types of CRPS:  Type 1. This type occurs after an injury with no known damage to a nerve.  Type 2. This type occurs after an injury that damages a nerve. There are three stages of the condition:  Stage 1. This stage, called the acute stage, may last for up to 3 months.  Stage 2. This stage, called the dystrophic stage, may last for 3-12 months.  Stage 3. This stage, called the atrophic stage, may start after one year. CRPS ranges from mild to severe. For most people, CRPS is mild and recovery happens over time. For others, CRPS lasts for a very long time and makes everyday tasks hard to do. What are the causes? The exact cause of this condition is not known. It is usually triggered by an injury. What increases the risk? You are more likely to develop this condition if:  You are female.  You have any of the following injuries: ? A wrist fracture that involves a lower arm bone (distal radius fracture). ? Ankle dislocation or fracture. ? A long surgery time. ? Possible nerve injury during surgery. What are the signs or symptoms? Signs and symptoms in the affected hand, arm, foot, or leg are different for each stage. Signs and symptoms of stage 1 include:  Burning pain.  A prickling, tingling feeling (pins and needles sensation).  Extremely sensitive skin.  Swelling.  Joint stiffness.  Warmth and redness.  Excessive sweating.  Hair and nail growth that is faster than normal. Signs and symptoms of stage 2 include:  Spreading of  pain to the whole arm or leg.  Increased skin sensitivity.  Increased swelling and stiffness.  Coolness of the skin.  Blue discoloration of skin.  Loss of skin wrinkles.  Brittle fingernails. Signs and symptoms of stage 3 include:  Pain that spreads to other areas of the body but becomes less severe.  More stiffness, leading to loss of motion.  Skin that is pale, dry, shiny, or tightly stretched. How is this diagnosed? This condition may be diagnosed based on:  Your signs and symptoms.  A physical exam. There is no test to diagnose CRPS, but you may have tests:  To check for bone changes that might indicate CRPS. These tests may include an MRI or bone scan.  To rule out other possible causes of your symptoms. How is this treated? Early treatment may prevent CRPS from advancing past stage 1. There is not one treatment that works for everyone. Treatment options may include:  Medicines, which may include: ? NSAIDs, such as ibuprofen. ? Steroids. ? Blood pressure drugs. ? Antidepressants. ? Anti-seizure drugs. ? Pain relievers.  Exercise.  Occupational therapy (OT) and physical therapy (PT).  Biofeedback.  Mental health counseling.  Numbing injections.  Spinal surgery to implant a spinal cord stimulator or a pain pump. Follow these instructions at home: Medicines  Take over-the-counter and prescription medicines only as told by your health care provider.  Do not drive or use heavy machinery while taking  prescription pain medicine.  If you are taking prescription pain medicine, take actions to prevent or treat constipation. Your health care provider may recommend that you: ? Drink enough fluid to keep your urine pale yellow. ? Eat foods that are high in fiber, such as fresh fruits and vegetables, whole grains, and beans. ? Limit foods that are high in fat and processed sugars, such as fried or sweet foods. ? Take an over-the-counter or prescription medicine  for constipation. General instructions  Do not use any products that contain nicotine or tobacco, such as cigarettes and e-cigarettes. If you need help quitting, ask your health care provider.  Maintain a healthy weight.  Return to your normal activities as told by your health care provider. Ask your health care provider what activities are safe for you.  Follow an exercise program as directed by your health care provider.  Keep all follow-up visits as told by your health care provider. This is important. Contact a health care provider if:  Your symptoms change.  Your symptoms get worse.  You develop anxiety or depression. Summary  Complex regional pain syndrome (CRPS) is a nerve disorder that causes long-term (chronic) pain, usually in a hand, arm, leg, or foot.  CRPS usually occurs after an injury or trauma, such as a fracture or sprain.  CRPS ranges from mild to severe. Early treatment may prevent CRPS from advancing to more severe stages. This information is not intended to replace advice given to you by your health care provider. Make sure you discuss any questions you have with your health care provider. Document Revised: 08/25/2017 Document Reviewed: 08/07/2017 Elsevier Patient Education  2020 [employer].
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
## 2020-01-07 ENCOUNTER — Telehealth: Payer: Self-pay

## 2020-01-07 NOTE — Telephone Encounter (Signed)
Pt was called to inform of medication at [facility]. Pt states she has already received a call from the pharmacy and is being sent medication from there. Pt did have questions regarding scheduling of physical therapy. Pt was previously told that physical therapy would contact her for scheduling. Pt advised to call the office if she did not hear anything back regarding physical therapy scheduling.

## 2020-01-13 ENCOUNTER — Ambulatory Visit (INDEPENDENT_AMBULATORY_CARE_PROVIDER_SITE_OTHER): Payer: Medicare HMO | Admitting: Family Medicine

## 2020-01-15 ENCOUNTER — Other Ambulatory Visit: Payer: Self-pay | Admitting: Podiatrist

## 2020-01-15 DIAGNOSIS — S93601A Unspecified sprain of right foot, initial encounter: Secondary | ICD-10-CM

## 2020-01-15 DIAGNOSIS — G90521 Complex regional pain syndrome I of right lower limb: Secondary | ICD-10-CM

## 2020-01-20 ENCOUNTER — Encounter (INDEPENDENT_AMBULATORY_CARE_PROVIDER_SITE_OTHER): Payer: Self-pay | Admitting: Family Medicine

## 2020-01-20 ENCOUNTER — Ambulatory Visit (INDEPENDENT_AMBULATORY_CARE_PROVIDER_SITE_OTHER): Payer: Medicare HMO | Admitting: Family Medicine

## 2020-01-20 ENCOUNTER — Other Ambulatory Visit: Payer: Self-pay

## 2020-01-20 VITALS — BP 121/80 | HR 82 | Temp 97.8°F | Ht 59.0 in | Wt 193.0 lb

## 2020-01-20 DIAGNOSIS — E559 Vitamin D deficiency, unspecified: Secondary | ICD-10-CM | POA: Diagnosis not present

## 2020-01-20 DIAGNOSIS — R7303 Prediabetes: Secondary | ICD-10-CM

## 2020-01-20 DIAGNOSIS — Z6839 Body mass index (BMI) 39.0-39.9, adult: Secondary | ICD-10-CM | POA: Diagnosis not present

## 2020-01-20 MED ORDER — VITAMIN D (ERGOCALCIFEROL) 1.25 MG (50000 UNIT) PO CAPS: 50000.0000 [IU] | ORAL_CAPSULE | ORAL | 0 refills | Status: DC

## 2020-01-21 ENCOUNTER — Ambulatory Visit (INDEPENDENT_AMBULATORY_CARE_PROVIDER_SITE_OTHER): Payer: Medicare HMO | Admitting: Bariatrics
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
## 2020-01-22 ENCOUNTER — Encounter (INDEPENDENT_AMBULATORY_CARE_PROVIDER_SITE_OTHER): Payer: Self-pay | Admitting: Family Medicine

## 2020-02-05 ENCOUNTER — Ambulatory Visit: Payer: Medicare HMO | Admitting: Neurology

## 2020-02-10 ENCOUNTER — Encounter (INDEPENDENT_AMBULATORY_CARE_PROVIDER_SITE_OTHER): Payer: Self-pay | Admitting: Family Medicine

## 2020-02-10 ENCOUNTER — Other Ambulatory Visit: Payer: Self-pay

## 2020-02-10 ENCOUNTER — Other Ambulatory Visit: Payer: Self-pay | Admitting: Neurology

## 2020-02-10 ENCOUNTER — Ambulatory Visit (INDEPENDENT_AMBULATORY_CARE_PROVIDER_SITE_OTHER): Payer: Medicare HMO | Admitting: Family Medicine

## 2020-02-10 VITALS — BP 122/75 | HR 67 | Temp 97.6°F | Ht 59.0 in | Wt 195.0 lb

## 2020-02-10 DIAGNOSIS — R7303 Prediabetes: Secondary | ICD-10-CM

## 2020-02-10 DIAGNOSIS — E559 Vitamin D deficiency, unspecified: Secondary | ICD-10-CM

## 2020-02-10 DIAGNOSIS — Z6839 Body mass index (BMI) 39.0-39.9, adult: Secondary | ICD-10-CM

## 2020-02-10 MED ORDER — VITAMIN D (ERGOCALCIFEROL) 1.25 MG (50000 UNIT) PO CAPS: 50000.0000 [IU] | ORAL_CAPSULE | ORAL | 0 refills | Status: DC

## 2020-02-11 ENCOUNTER — Ambulatory Visit (INDEPENDENT_AMBULATORY_CARE_PROVIDER_SITE_OTHER): Payer: Medicare HMO | Admitting: Neurology

## 2020-02-11 ENCOUNTER — Encounter: Payer: Self-pay | Admitting: Neurology

## 2020-02-11 ENCOUNTER — Encounter (INDEPENDENT_AMBULATORY_CARE_PROVIDER_SITE_OTHER): Payer: Self-pay | Admitting: Family Medicine

## 2020-02-11 VITALS — BP 129/74 | HR 70 | Ht 59.0 in | Wt 198.0 lb

## 2020-02-11 DIAGNOSIS — Z87828 Personal history of other (healed) physical injury and trauma: Secondary | ICD-10-CM

## 2020-02-11 DIAGNOSIS — M25512 Pain in left shoulder: Secondary | ICD-10-CM | POA: Diagnosis not present

## 2020-02-11 DIAGNOSIS — S069X1A Unspecified intracranial injury with loss of consciousness of 30 minutes or less, initial encounter: Secondary | ICD-10-CM | POA: Diagnosis not present

## 2020-02-11 DIAGNOSIS — R270 Ataxia, unspecified: Secondary | ICD-10-CM

## 2020-02-11 MED ORDER — DONEPEZIL HCL 5 MG PO TABS: 5.0000 mg | ORAL_TABLET | Freq: Two times a day (BID) | ORAL | 0 refills | Status: DC

## 2020-02-11 MED ORDER — TRAZODONE HCL 50 MG PO TABS: 50.0000 mg | ORAL_TABLET | Freq: Every evening | ORAL | 0 refills | Status: DC | PRN

## 2020-02-11 NOTE — Patient Instructions (Signed)
Concussion, Adult  A concussion is a brain injury from a hard, direct hit (trauma) to your head or body. This direct hit causes the brain to quickly shake back and forth inside the skull. A concussion may also be called a mild traumatic brain injury (TBI). Healing from this injury can take time. What are the causes? This condition is caused by:  A direct hit to your head, such as: ? Running into a player during a game. ? Being hit in a fight. ? Hitting your head on a hard surface.  A quick and sudden movement (jolt) of the head or neck, such as in a car crash. What are the signs or symptoms? The signs of a concussion can be hard to notice. They may be missed by you, family members, and doctors. You may look fine on the outside but may not act or feel normal. Physical symptoms  Headaches.  Being tired (fatigued).  Being dizzy.  Problems with body balance.  Problems seeing or hearing.  Being sensitive to light or noise.  Feeling sick to your stomach (nausea) or throwing up (vomiting).  Not sleeping or eating as you used to.  Loss of feeling (numbness) or tingling in the body.  Seizure. Mental and emotional symptoms  Problems remembering things.  Trouble focusing your mind (concentrating), organizing, or making decisions.  Being slow to think, act, react, speak, or read.  Feeling grouchy (irritable).  Having mood changes.  Feeling worried or nervous (anxious).  Feeling sad (depressed). How is this treated? This condition may be treated by:  Stopping sports or activity if you are injured. If you hit your head or have signs of concussion: ? Do not return to sports or activities the same day. ? Get checked by a doctor before you return to your activities.  Resting your body and your mind.  Being watched carefully, often at home.  Medicines to help with symptoms such as: ? Feeling sick to your stomach. ? Headaches. ? Problems with sleep.  Avoid taking strong  pain medicines (opioids) for a concussion.  Avoiding alcohol and drugs.  Being asked to go to a concussion clinic or a place to help you recover (rehabilitation center). Recovery from a concussion can take time. Return to activities only:  When you are fully healed.  When your doctor says it is safe. Follow these instructions at home: Activity  Limit activities that need a lot of thought or focus, such as: ? Homework or work for your job. ? Watching TV. ? Using the computer or phone. ? Playing memory games and puzzles.  Rest. Rest helps your brain heal. Make sure you: ? Get plenty of sleep. Most adults should get 7-9 hours of sleep each night. ? Rest during the day. Take naps or breaks when you feel tired.  Avoid activity like exercise until your doctor says its safe. Stop any activity that makes symptoms worse.  Do not do activities that could cause a second concussion, such as riding a bike or playing sports.  Ask your doctor when you can return to your normal activities, such as school, work, sports, and driving. Your ability to react may be slower. Do not do these activities if you are dizzy. General instructions   Take over-the-counter and prescription medicines only as told by your doctor.  Do not drink alcohol until your doctor says you can.  Watch your symptoms and tell other people to do the same. Other problems can occur after a concussion. Older   adults have a higher risk of serious problems.  Tell your work Freight forwarder, teachers, Government social research officer, school counselor, coach, or Product/process development scientist about your injury and symptoms. Tell them about what you can or cannot do.  Keep all follow-up visits as told by your doctor. This is important. How is this prevented?  It is very important that you do not get another brain injury. In rare cases, another injury can cause brain damage that will not go away, brain swelling, or death. The risk of this is greatest in the first 7-10 days  after a head injury. To avoid injuries: ? Stop activities that could lead to a second concussion, such as contact sports, until your doctor says it is okay. ? When you return to sports or activities:  Do not crash into other players. This is how most concussions happen.  Follow the rules.  Respect other players. ? Get regular exercise. Do strength and balance training. ? Wear a helmet that fits you well during sports, biking, or other activities.  Helmets can help protect you from serious skull and brain injuries, but they do not protect you from a concussion. Even when wearing a helmet, you should avoid being hit in the head. Contact a doctor if:  Your symptoms get worse or they do not get better.  You have new symptoms.  You have another injury. Get help right away if:  You have bad headaches or your headaches get worse.  You feel weak or numb in any part of your body.  You are mixed up (confused).  Your balance gets worse.  You keep throwing up.  You feel more sleepy than normal.  Your speech is not clear (is slurred).  You cannot recognize people or places.  You have a seizure.  Others have trouble waking you up.  You have behavior changes.  You have changes in how you see (vision).  You pass out (lose consciousness). Summary  A concussion is a brain injury from a hard, direct hit (trauma) to your head or body.  This condition is treated with rest and careful watching of symptoms.  If you keep having symptoms, call your doctor. This information is not intended to replace advice given to you by your health care provider. Make sure you discuss any questions you have with your health care provider. Document Revised: 05/03/2018 Document Reviewed: 05/03/2018 Elsevier Patient Education  [place]. Cervical Radiculopathy  Cervical radiculopathy happens when a nerve in the neck (a cervical nerve) is pinched or bruised. This condition can happen because  of an injury to the cervical spine (vertebrae) in the neck, or as part of the normal aging process. Pressure on the cervical nerves can cause pain or numbness that travels from the neck all the way down into the arm and fingers. Usually, this condition gets better with rest. Treatment may be needed if the condition does not improve. What are the causes? This condition may be caused by:  A neck injury.  A bulging (herniated) disk.  Muscle spasms.  Muscle tightness in the neck because of overuse.  Arthritis.  Breakdown or degeneration in the bones and joints of the spine (spondylosis) due to aging.  Bone spurs that may develop near the cervical nerves. What are the signs or symptoms? Symptoms of this condition include:  Pain. The pain may travel from the neck to the arm and hand. The pain can be severe or irritating. It may be worse when you move your neck.  Numbness or tingling in your arm or hand.  Weakness in the affected arm and hand, in severe cases. How is this diagnosed? This condition may be diagnosed based on your symptoms, your medical history, and a physical exam. You may also have tests, including:  X-rays.  A CT scan.  An MRI.  An electromyogram (EMG).  Nerve conduction tests. How is this treated? In many cases, treatment is not needed for this condition. With rest, the condition usually gets better over time. If treatment is needed, options may include:  Wearing a soft neck collar (cervical collar) for short periods of time, as told by your health care provider.  Doing physical therapy to strengthen your neck muscles.  Taking medicines, such as NSAIDs or oral corticosteroids.  Having spinal injections, in severe cases.  Having surgery. This may be needed if other treatments do not help. Different types of surgery may be done depending on the cause of this condition. Follow these instructions at home: If you have a cervical collar:  Wear it as told by  your health care provider. Remove it only as told by your health care provider.  Ask your health care provider if you can remove the collar for cleaning and bathing. If you are allowed to remove the collar for cleaning or bathing: ? Follow instructions from your health care provider about how to remove the collar safely. ? Clean the collar by wiping it with mild soap and water and drying it completely. ? Take out any removable pads in the collar every 1-2 days, and wash them by hand with soap and water. Let them air-dry completely before you put them back in the collar. ? Check your skin under the collar for irritation or sores. If you see any, tell your health care provider. Managing pain      Take over-the-counter and prescription medicines only as told by your health care provider.  If directed, put ice on the affected area. ? If you have a soft neck collar, remove it as told by your health care provider. ? Put ice in a plastic bag. ? Place a towel between your skin and the bag. ? Leave the ice on for 20 minutes, 2-3 times a day.  If applying ice does not help, you can try using heat. Use the heat source that your health care provider recommends, such as a moist heat pack or a heating pad. ? Place a towel between your skin and the heat source. ? Leave the heat on for 20-30 minutes. ? Remove the heat if your skin turns bright red. This is especially important if you are unable to feel pain, heat, or cold. You may have a greater risk of getting burned.  Try a gentle neck and shoulder massage to help relieve symptoms. Activity  Rest as needed.  Return to your normal activities as told by your health care provider. Ask your health care provider what activities are safe for you.  Do stretching and strengthening exercises as told by your health care provider or physical therapist.  Do not lift anything that is heavier than 10 lb (4.5 kg) until your health care provider tells you that  it is safe. General instructions  Use a flat pillow when you sleep.  Do not drive while wearing a cervical collar. If you do not have a cervical collar, ask your health care provider if it is safe to drive while your neck heals.  Ask your health care provider if the medicine  prescribed to you requires you to avoid driving or using heavy machinery.  Do not use any products that contain nicotine or tobacco, such as cigarettes, e-cigarettes, and chewing tobacco. These can delay healing. If you need help quitting, ask your health care provider.  Keep all follow-up visits as told by your health care provider. This is important. Contact a health care provider if:  Your condition does not improve with treatment. Get help right away if:  Your pain gets much worse and cannot be controlled with medicines.  You have weakness or numbness in your hand, arm, face, or leg.  You have a high fever.  You have a stiff, rigid neck.  You lose control of your bowels or your bladder (have incontinence).  You have trouble with walking, balance, or speaking. Summary  Cervical radiculopathy happens when a nerve in the neck is pinched or bruised.  A nerve can get pinched from a bulging disk, arthritis, muscle spasms, or an injury to the neck.  Symptoms include pain, tingling, or numbness radiating from the neck into the arm or hand. Weakness can also occur in severe cases.  Treatment may include rest, wearing a cervical collar, and physical therapy. Medicines may be prescribed to help with pain. In severe cases, injections or surgery may be needed. This information is not intended to replace advice given to you by your health care provider. Make sure you discuss any questions you have with your health care provider. Document Revised: 08/03/2018 Document Reviewed: 08/03/2018 Elsevier Patient Education  2020 [employer].
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
## 2020-02-13 ENCOUNTER — Other Ambulatory Visit: Payer: Self-pay | Admitting: Neurology

## 2020-02-13 ENCOUNTER — Telehealth: Payer: Self-pay | Admitting: Neurology

## 2020-02-13 MED ORDER — DONEPEZIL HCL 5 MG PO TABS: 5.0000 mg | ORAL_TABLET | Freq: Two times a day (BID) | ORAL | 0 refills | Status: DC

## 2020-02-13 NOTE — Telephone Encounter (Signed)
PA completed through [employer]/cover my meds for the patient for donepezil 5 mg BID. Approved through [employer] until 09/25/2020 KEY [id number]

## 2020-02-13 NOTE — Telephone Encounter (Signed)
PA submitted for the patient through cover my meds [employer], KEY: [id number]

## 2020-02-19 ENCOUNTER — Encounter (INDEPENDENT_AMBULATORY_CARE_PROVIDER_SITE_OTHER): Payer: Self-pay

## 2020-02-20 ENCOUNTER — Telehealth: Payer: Self-pay | Admitting: Neurology

## 2020-02-20 NOTE — Telephone Encounter (Signed)
Francine Graven Berkley Harvey: [phone] (exp. 02/20/20 to 03/21/20) faxed paper for [facility] to reach out to the patient to schedule sedated MRI at [facility]. Also put H&P form on nurse [employer].

## 2020-02-25 ENCOUNTER — Telehealth: Payer: Self-pay

## 2020-02-25 NOTE — Telephone Encounter (Signed)
Patient is scheduled at [facility] cone for 03/10/20.

## 2020-02-25 NOTE — Telephone Encounter (Signed)
H& P form fax twice to [phone] twice and it was confirmed receive..I call MRI to find out where the form for MRI sedation needs to be fax too. I ask for a contact person or nurse who handles these forms. The MRI staff was unable to give me a fax number or contact person for the form. THey stated to fax form to listed number above and the form will be routed to the appropriate person.

## 2020-03-03 ENCOUNTER — Ambulatory Visit (INDEPENDENT_AMBULATORY_CARE_PROVIDER_SITE_OTHER): Payer: Medicare HMO | Admitting: Adult Health

## 2020-03-04 ENCOUNTER — Ambulatory Visit (INDEPENDENT_AMBULATORY_CARE_PROVIDER_SITE_OTHER): Payer: Medicare HMO | Admitting: Neurology

## 2020-03-04 ENCOUNTER — Encounter: Payer: Self-pay | Admitting: Neurology

## 2020-03-04 ENCOUNTER — Other Ambulatory Visit: Payer: Self-pay

## 2020-03-04 DIAGNOSIS — M25561 Pain in right knee: Secondary | ICD-10-CM

## 2020-03-04 DIAGNOSIS — S069X1A Unspecified intracranial injury with loss of consciousness of 30 minutes or less, initial encounter: Secondary | ICD-10-CM

## 2020-03-04 DIAGNOSIS — R270 Ataxia, unspecified: Secondary | ICD-10-CM

## 2020-03-04 DIAGNOSIS — Z87828 Personal history of other (healed) physical injury and trauma: Secondary | ICD-10-CM

## 2020-03-04 DIAGNOSIS — M25512 Pain in left shoulder: Secondary | ICD-10-CM
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
## 2020-03-04 NOTE — Progress Notes (Signed)
Please refer to EMG and nerve conduction procedure note.  

## 2020-03-04 NOTE — Progress Notes (Signed)
[facility]    Nerve / Sites Muscle Latency Ref. Amplitude Ref. Rel Amp Segments Distance Velocity Ref. Area    ms ms mV mV %  cm m/s m/s mVms  L Median - APB     Wrist APB 3.1 ?4.4 7.4 ?4.0 100 Wrist - APB 7   18.8     Upper arm APB 6.8  6.9  94.3 Upper arm - Wrist 20 54 ?49 17.6  L Ulnar - ADM     Wrist ADM 2.1 ?3.3 13.9 ?6.0 100 Wrist - ADM 7   34.5     B.Elbow ADM 5.2  12.9  92.4 B.Elbow - Wrist 18 58 ?49 33.6     A.Elbow ADM 6.9  12.5  97 A.Elbow - B.Elbow 10 59 ?49 32.6         A.Elbow - Wrist      R Peroneal - EDB     Ankle EDB 4.1 ?6.5 6.1 ?2.0 100 Ankle - EDB 9   13.3     Fib head EDB 9.0  5.8  94.8 Fib head - Ankle 24 48 ?44 13.2     Pop fossa EDB 11.0  5.1  87.7 Pop fossa - Fib head 10 50 ?44 11.6         Pop fossa - Ankle      R Tibial - AH     Ankle AH 4.3 ?5.8 9.5 ?4.0 100 Ankle - AH 9   17.0     Pop fossa AH 11.6  7.0  73.9 Pop fossa - Ankle 31 43 ?41 16.3             SNC    Nerve / Sites Rec. Site Peak Lat Ref.  Amp Ref. Segments Distance    ms ms V V  cm  R Sural - Ankle (Calf)     Calf Ankle 3.7 ?4.4 20 ?6 Calf - Ankle 14  R Superficial peroneal - Ankle     Lat leg Ankle 3.5 ?4.4 11 ?6 Lat leg - Ankle 14  L Median - Orthodromic (Dig II, Mid palm)     Dig II Wrist 3.0 ?3.4 21 ?10 Dig II - Wrist 13  L Ulnar - Orthodromic, (Dig V, Mid palm)     Dig V Wrist 2.5 ?3.1 10 ?5 Dig V - Wrist 52             F  Wave    Nerve F Lat Ref.   ms ms  L Ulnar - ADM 25.9 ?32.0  R Tibial - AH 46.1 ?56.0

## 2020-03-05 ENCOUNTER — Telehealth: Payer: Self-pay

## 2020-03-05 NOTE — Telephone Encounter (Signed)
Melvyn Novas, MD  03/04/2020 1:50 PM EDT Back to Top    IMPRESSION:   Nerve conduction studies done on the left upper extremity and the  right lower extremity were within normal limits, no evidence of a  neuropathy is seen. A limited EMG evaluation of the left upper  extremity was unremarkable.   The patient refused full EMG  evaluation of the left upper extremity in the right lower  extremity.    Facit : Incomplete evaluation- still, no evidence of brachial plexus lesion, no fibrillations. The patient may have had extensive soft tissue damage to the area.  Further evaluation and treatment through Rehab/ PT.   C. Lesia Sago MD  03/04/2020 1:34 PM     Patient Communication   Nerve conduction studies done on the left upper extremity and the  right lower extremity were within normal limits, no evidence of a  neuropathy is seen. A limited EMG evaluation of the left upper  extremity was unremarkable.   Pt was sent results on 03/04/2020 by MD. Mychart message was sent to the pt on 03/05/2020 to f/u and see if she had any questions about the study.

## 2020-03-07 ENCOUNTER — Ambulatory Visit (HOSPITAL_COMMUNITY): Admission: RE | Admit: 2020-03-07 | Payer: Medicare HMO | Source: Ambulatory Visit

## 2020-03-09 ENCOUNTER — Encounter (HOSPITAL_COMMUNITY): Payer: Self-pay | Admitting: Certified Registered"

## 2020-03-09 ENCOUNTER — Other Ambulatory Visit (HOSPITAL_COMMUNITY)
Admission: RE | Admit: 2020-03-09 | Discharge: 2020-03-09 | Disposition: A | Discharge status: Home / Self Care | Payer: Medicare HMO | Source: Ambulatory Visit | Attending: Neurology | Admitting: Neurology

## 2020-03-09 ENCOUNTER — Encounter (HOSPITAL_COMMUNITY): Payer: Self-pay | Admitting: Vascular Surgery

## 2020-03-09 ENCOUNTER — Other Ambulatory Visit: Payer: Self-pay

## 2020-03-09 ENCOUNTER — Encounter (HOSPITAL_COMMUNITY): Payer: Self-pay | Admitting: *Deleted

## 2020-03-09 DIAGNOSIS — Z01812 Encounter for preprocedural laboratory examination: Secondary | ICD-10-CM | POA: Diagnosis present

## 2020-03-09 DIAGNOSIS — Z20822 Contact with and (suspected) exposure to covid-19: Secondary | ICD-10-CM | POA: Insufficient documentation

## 2020-03-09 LAB — SARS CORONAVIRUS 2 (TAT 6-24 HRS): SARS Coronavirus 2: NEGATIVE

## 2020-03-09 NOTE — Anesthesia Preprocedure Evaluation (Deleted)
Anesthesia Evaluation  Patient identified by MRN, date of birth, ID band Patient awake    Reviewed: Allergy & Precautions, NPO status , Patient's Chart, lab work & pertinent test results  History of Anesthesia Complications Negative for: history of anesthetic complications  Airway        Dental   Pulmonary neg pulmonary ROS,           Cardiovascular hypertension,      Neuro/Psych Anxiety Depression Severe claustrophobianegative neurological ROS     GI/Hepatic negative GI ROS, Neg liver ROS,   Endo/Other  Morbid obesity  Renal/GU negative Renal ROS  negative genitourinary   Musculoskeletal  (+) Arthritis ,   Abdominal   Peds  Hematology negative hematology ROS (+)   Anesthesia Other Findings   Reproductive/Obstetrics                            Anesthesia Physical Anesthesia Plan  ASA: II  Anesthesia Plan: General   Post-op Pain Management:    Induction: Intravenous  PONV Risk Score and Plan: 3 and Ondansetron, Dexamethasone, Treatment may vary due to age or medical condition and Midazolam  Airway Management Planned: Oral ETT  Additional Equipment: None  Intra-op Plan:   Post-operative Plan: Extubation in OR  Informed Consent: I have reviewed the patients History and Physical, chart, labs and discussed the procedure including the risks, benefits and alternatives for the proposed anesthesia with the patient or authorized representative who has indicated his/her understanding and acceptance.     Dental advisory given  Plan Discussed with:   Anesthesia Plan Comments: ( Patient decided to cancel due to severe claustrophobia and anxiety around getting the MRI. She stated that she does not want to be in the MRI machine, even knowing she would be completely asleep. She did not have any concerns about anesthesia. I explained to her that she would be provided anxiolysis prior to  transport to the radiology suite and GA would be induced outside of the MRI machine, so she would never be aware of being in the machine. Even with this information she still elected to cancel.)    Anesthesia Quick Evaluation
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
## 2020-03-09 NOTE — Progress Notes (Signed)
Patient denies shortness of breath, fever, cough or chest pain.  PCP - [facility]  Cardiologist - n/a Neurology - Dr Dohmeier  Chest x-ray -12/19/19 (1V)  EKG - 12/19/19 Stress Test - n/a ECHO - 01/29/07 Cardiac Cath - n/a  Anesthesia review: Yes  STOP now taking any Aspirin (unless otherwise instructed by your surgeon), Aleve, Naproxen, Ibuprofen, Motrin, Advil, Goody's, BC's, all herbal medications, fish oil, and all vitamins.   Coronavirus Screening Covid test scheduled 03/09/20 Do you have any of the following symptoms:  Cough yes/no: No Fever (>100.60F)  yes/no: No Runny nose yes/no: No Sore throat yes/no: No Difficulty breathing/shortness of breath  yes/no: No  Have you traveled in the last 14 days and where? yes/no: No  Patient verbalized understanding of instructions that were given via phone.

## 2020-03-10 ENCOUNTER — Ambulatory Visit (HOSPITAL_COMMUNITY)
Admission: RE | Admit: 2020-03-10 | Discharge: 2020-03-10 | Disposition: A | Discharge status: Home / Self Care | Payer: Medicare HMO | Source: Ambulatory Visit | Attending: Neurology | Admitting: Neurology

## 2020-03-10 ENCOUNTER — Ambulatory Visit (HOSPITAL_COMMUNITY)
Admission: RE | Admit: 2020-03-10 | Discharge: 2020-03-10 | Disposition: A | Discharge status: Home / Self Care | Payer: Medicare HMO | Attending: Neurology | Admitting: Neurology

## 2020-03-10 ENCOUNTER — Encounter (HOSPITAL_COMMUNITY): Payer: Self-pay

## 2020-03-10 ENCOUNTER — Other Ambulatory Visit: Payer: Self-pay

## 2020-03-10 ENCOUNTER — Encounter (HOSPITAL_COMMUNITY)
Admission: RE | Disposition: A | Discharge status: Home / Self Care | Payer: Self-pay | Source: Home / Self Care | Attending: Neurology

## 2020-03-10 ENCOUNTER — Ambulatory Visit (HOSPITAL_COMMUNITY): Payer: Medicare HMO

## 2020-03-10 DIAGNOSIS — Z5320 Procedure and treatment not carried out because of patient's decision for unspecified reasons: Secondary | ICD-10-CM | POA: Insufficient documentation

## 2020-03-10 DIAGNOSIS — R52 Pain, unspecified: Secondary | ICD-10-CM | POA: Diagnosis present

## 2020-03-10 DIAGNOSIS — F40240 Claustrophobia: Secondary | ICD-10-CM | POA: Diagnosis not present

## 2020-03-10 HISTORY — DX: Anxiety disorder, unspecified: F41.9

## 2020-03-10 HISTORY — DX: Headache, unspecified: R51.9

## 2020-03-10 HISTORY — DX: Prediabetes: R73.03

## 2020-03-10 HISTORY — DX: Other amnesia: R41.3

## 2020-03-10 HISTORY — DX: Unspecified cataract: H26.9

## 2020-03-10 LAB — CBC
HCT: 42.8 % (ref 36.0–46.0)
Hemoglobin: 13.1 g/dL (ref 12.0–15.0)
MCH: 27.4 pg (ref 26.0–34.0)
MCHC: 30.6 g/dL (ref 30.0–36.0)
MCV: 89.5 fL (ref 80.0–100.0)
Platelets: 345 10*3/uL (ref 150–400)
RBC: 4.78 MIL/uL (ref 3.87–5.11)
RDW: 13.8 % (ref 11.5–15.5)
WBC: 7.3 10*3/uL (ref 4.0–10.5)
nRBC: 0 % (ref 0.0–0.2)

## 2020-03-10 LAB — COMPREHENSIVE METABOLIC PANEL
ALT: 13 U/L (ref 0–44)
AST: 18 U/L (ref 15–41)
Albumin: 3.9 g/dL (ref 3.5–5.0)
Alkaline Phosphatase: 85 U/L (ref 38–126)
Anion gap: 9 (ref 5–15)
BUN: 19 mg/dL (ref 8–23)
CO2: 26 mmol/L (ref 22–32)
Calcium: 9.6 mg/dL (ref 8.9–10.3)
Chloride: 105 mmol/L (ref 98–111)
Creatinine, Ser: 0.77 mg/dL (ref 0.44–1.00)
GFR calc Af Amer: >60 mL/min (ref >60)
GFR calc non Af Amer: >60 mL/min (ref >60)
Glucose, Bld: 106 mg/dL — ABNORMAL HIGH (ref 70–99)
Potassium: 3.3 mmol/L — ABNORMAL LOW (ref 3.5–5.1)
Sodium: 140 mmol/L (ref 135–145)
Total Bilirubin: 0.5 mg/dL (ref 0.3–1.2)
Total Protein: 7.4 g/dL (ref 6.5–8.1)

## 2020-03-10 SURGERY — MRI WITH ANESTHESIA
Anesthesia: General

## 2020-03-10 NOTE — Progress Notes (Signed)
Patient decided she did not want to have the MRI procedure.  I spoke with patient along with CRNA Raynelle Fanning and Dr Almyra Free Anesthesia.    I informed CRNA Raynelle Fanning, Dr Clemens Catholic and OR Desk Tiffany.  Patient is getting dressed to go home.

## 2020-03-11 ENCOUNTER — Telehealth: Payer: Self-pay | Admitting: Neurology

## 2020-03-11 DIAGNOSIS — M25562 Pain in left knee: Secondary | ICD-10-CM | POA: Insufficient documentation

## 2020-03-11 MED ORDER — ALPRAZOLAM 1 MG PO TABS: ORAL_TABLET | ORAL | 0 refills | Status: DC

## 2020-03-11 NOTE — Telephone Encounter (Signed)
Called the patient and there was no answer. VM was not set up and was unable to leave a message.  IF pt calls back please advise that Dr Vickey Huger has sent xanax for the patient to take for MRI. She would need a driver for the MRI. She would need to take 1 tablet 30 min prior to the scheduled MRI apt. Then if the additional tablet is needed she can take that at the time of the apt. Medication was sent to [facility] on file.

## 2020-03-11 NOTE — Telephone Encounter (Signed)
Pt has called to report she was unable to take the MRI. Pt would like a call to discuss a medication to take for preporation of a  MRI or other treatment options such as therapy.  Please call

## 2020-03-11 NOTE — Addendum Note (Signed)
Addended by: Melvyn Novas on: 03/11/2020 03:47 PM   Modules accepted: Orders

## 2020-03-12 ENCOUNTER — Other Ambulatory Visit: Payer: Self-pay

## 2020-03-12 ENCOUNTER — Ambulatory Visit (INDEPENDENT_AMBULATORY_CARE_PROVIDER_SITE_OTHER): Payer: Medicare HMO | Admitting: Podiatrist

## 2020-03-12 ENCOUNTER — Encounter (INDEPENDENT_AMBULATORY_CARE_PROVIDER_SITE_OTHER): Payer: Self-pay | Admitting: Adult Health

## 2020-03-12 ENCOUNTER — Ambulatory Visit (INDEPENDENT_AMBULATORY_CARE_PROVIDER_SITE_OTHER): Payer: Medicare HMO | Admitting: Adult Health

## 2020-03-12 VITALS — BP 118/72 | HR 68 | Temp 97.5°F | Ht 59.0 in | Wt 195.0 lb

## 2020-03-12 DIAGNOSIS — E559 Vitamin D deficiency, unspecified: Secondary | ICD-10-CM

## 2020-03-12 DIAGNOSIS — Z6839 Body mass index (BMI) 39.0-39.9, adult: Secondary | ICD-10-CM | POA: Diagnosis not present

## 2020-03-12 DIAGNOSIS — S99921D Unspecified injury of right foot, subsequent encounter: Secondary | ICD-10-CM

## 2020-03-12 DIAGNOSIS — I10 Essential (primary) hypertension: Secondary | ICD-10-CM

## 2020-03-12 DIAGNOSIS — S93601D Unspecified sprain of right foot, subsequent encounter: Secondary | ICD-10-CM | POA: Diagnosis not present

## 2020-03-12 MED ORDER — VITAMIN D (ERGOCALCIFEROL) 1.25 MG (50000 UNIT) PO CAPS: 50000.0000 [IU] | ORAL_CAPSULE | ORAL | 0 refills | Status: DC

## 2020-03-12 NOTE — Patient Instructions (Signed)
Ask your physical therapist if she thinks a TENS unit might be helpful for at home use-    If so, they are available at most drug stores and online at [facility].  You are improving so much!!  Keep up the work!

## 2020-03-12 NOTE — Progress Notes (Signed)
Chief Complaint  Patient presents with  . Follow-up    F/U- pt state she is doing alot better, about 70% better than last visit. she states she is stilling applying topical pain cream- currently in physical therapy.      HPI: Patient is 69 y.o. female who presents today for follow up from right foot and ankle pain.  She was in notable severe pain at the last visit and today appears much improved.  She states the pain cream is helping and physical therapy is also helping.  She states she is about 70% better.     Allergies  Allergen Reactions  . Trazodone And Nefazodone Other (See Comments)    Bad headaches  . Codeine Nausea Only  . Propoxyphene N-Acetaminophen     REACTION: "makes me sick"    Review of systems is reviewed and negative.   Physical Exam  Patient is awake, alert, and oriented x 3.  In no acute distress.    Vascular status is intact with palpable pedal pulses DP and PT bilateral and capillary refill time less than 3 seconds bilateral.  No varicositis or erythema noted.   Neurological exam reveals epicritic and protective sensation grossly intact bilateral.   Dermatological exam reveals skin is supple and dry to bilateral feet and lower legs.  Skin appears normal texture and turger to feet and anterior shins and ankles.  No open lesions present.    Musculoskeletal exam: Significant improvement in exam of the right foot and ankle is noted.  Some soft swelling is noted at the dorsal mpj heads of the right foot and midfoot.  Mild pain with flexion and extension of digits.  Ankle range of motion appears normal.    Assessment:    ICD-10-CM   1. Sprain of right foot, subsequent encounter  S93.601D   2. Soft tissue injury of right foot, subsequent encounter  [phone]      Plan:  Recommended continuation of the physical therapy and asked her to ask her therapist of a TENS unit to use at home might be beneficial.  Also recommended she continue to use the pain cream  as needed.  Recommended she finish out physical therapy and if she is still having pain on the top of the foot we could consider an injection if she would like.  She will be seen back if she would like to pursue or if she has any concerns.  Otherwise this should resolve and improve with current treatments of physical therapy.   Marland Kitchen
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
## 2020-03-16 NOTE — Progress Notes (Signed)
Chief Complaint:   OBESITY Kalinda is here to discuss her progress with her obesity treatment plan along with follow-up of her obesity related diagnoses. Esra is on keeping a food journal and adhering to recommended goals of 1000-1150 calories and 80 grams of protein daily and states she is following her eating plan approximately 70% of the time. Zoraya states she is leg exercise and walking for 15 minutes 2-3 times per week.  Today's visit was #: 16 Starting weight: 213 lbs Starting date: 06/04/2019 Today's weight: 195 lbs Today's date: 03/12/2020 Total lbs lost to date: 18 Total lbs lost since last in-office visit: 0  Interim History: Laurana has been struggling with portion control and she feels like it is time for a more "structured plan". She denies excessive hunger and has been trying to choose higher protein foods and snacks.  Subjective:   1. Vitamin D deficiency Ravan's Vitamin D level was 46.7 on 09/17/2019. She is currently taking prescription vitamin D 50,000 IU each week. She denies nausea, vomiting or muscle weakness.  2. Essential hypertension Liala's blood pressure is well controlled at her office visit today. She is on hydrochlorothiazide 12.5 mg q daily Klor con 10 meq q daily. She denies cardiac symptoms.  Assessment/Plan:   1. Vitamin D deficiency Low Vitamin D level contributes to fatigue and are associated with obesity, breast, and colon cancer. We will refill prescription Vitamin D for 1 month. Kathreen will follow-up for routine testing of Vitamin D, at least 2-3 times per year to avoid over-replacement. We will recheck labs at her next office visit.  - Vitamin D, Ergocalciferol, (DRISDOL) 1.25 MG (50000 UNIT) CAPS capsule; Take 1 capsule (50,000 Units total) by mouth every 7 (seven) days.  Dispense: 4 capsule; Refill: 0  2. Essential hypertension Kadasia will restart Category 1 meal plan, remain on diuretics, and will continue working on healthy  weight loss and exercise to improve blood pressure control. We will watch for signs of hypotension as she continues her lifestyle modifications.   3. Class 2 severe obesity with serious comorbidity and body mass index (BMI) of 39.0 to 39.9 in adult, unspecified obesity type [facility]) Daissy is currently in the action stage of change. As such, her goal is to continue with weight loss efforts. She has agreed to change to the Category 1 Plan.   Handout provided today: Additional breakfast options.  Exercise goals: As is.  Behavioral modification strategies: increasing lean protein intake, decreasing simple carbohydrates, no skipping meals, meal planning and cooking strategies and planning for success.  Katee has agreed to follow-up with our clinic in 2 weeks. She was informed of the importance of frequent follow-up visits to maximize her success with intensive lifestyle modifications for her multiple health conditions.   Objective:   Blood pressure 118/72, pulse 68, temperature (!) 97.5 F (36.4 C), temperature source Oral, height 4\' 11"  (1.499 m), weight 195 lb (88.5 kg), SpO2 98 %. Body mass index is 39.39 kg/m.  General: Cooperative, alert, well developed, in no acute distress. HEENT: Conjunctivae and lids unremarkable. Cardiovascular: Regular rhythm.  Lungs: Normal work of breathing. Neurologic: No focal deficits.   Lab Results  Component Value Date   CREATININE 0.77 03/10/2020   BUN 19 03/10/2020   NA 140 03/10/2020   K 3.3 (L) 03/10/2020   CL 105 03/10/2020   CO2 26 03/10/2020   Lab Results  Component Value Date   ALT 13 03/10/2020   AST 18 03/10/2020   ALKPHOS  85 03/10/2020   BILITOT 0.5 03/10/2020   Lab Results  Component Value Date   HGBA1C 5.7 (H) 09/17/2019   HGBA1C 5.9 (H) 06/04/2019   HGBA1C 5.9 03/01/2018   HGBA1C 5.8 05/08/2017   HGBA1C 5.5 10/28/2016   Lab Results  Component Value Date   INSULIN 17.7 09/17/2019   INSULIN 10.7 06/04/2019   Lab Results   Component Value Date   TSH 1.620 06/04/2019   Lab Results  Component Value Date   CHOL 186 06/04/2019   HDL 64 06/04/2019   LDLCALC 101 (H) 06/04/2019   TRIG 120 06/04/2019   CHOLHDL 3 03/01/2018   Lab Results  Component Value Date   WBC 7.3 03/10/2020   HGB 13.1 03/10/2020   HCT 42.8 03/10/2020   MCV 89.5 03/10/2020   PLT 345 03/10/2020   No results found for: IRON, TIBC, FERRITIN  Obesity Behavioral Intervention Documentation for Insurance:   Approximately 15 minutes were spent on the discussion below.  ASK: We discussed the diagnosis of obesity with [name] today and Kyia agreed to give [place] permission to discuss obesity behavioral modification therapy today.  ASSESS: Inella has the diagnosis of obesity and her BMI today is 39.36. Lexxi is in the action stage of change.   ADVISE: Hartley was educated on the multiple health risks of obesity as well as the benefit of weight loss to improve her health. She was advised of the need for long term treatment and the importance of lifestyle modifications to improve her current health and to decrease her risk of future health problems.  AGREE: Multiple dietary modification options and treatment options were discussed and Lianette agreed to follow the recommendations documented in the above note.  ARRANGE: Halima was educated on the importance of frequent visits to treat obesity as outlined per CMS and USPSTF guidelines and agreed to schedule her next follow up appointment today.  Attestation Statements:   Reviewed by clinician on day of visit: allergies, medications, problem list, medical history, surgical history, family history, social history, and previous encounter notes.   [name], am acting as Energy manager for [employer], NP-C.  I have reviewed the above documentation for accuracy and completeness, and I agree with the above. -  Julaine Fusi, NP

## 2020-03-24 ENCOUNTER — Other Ambulatory Visit: Payer: Self-pay

## 2020-03-24 ENCOUNTER — Ambulatory Visit (INDEPENDENT_AMBULATORY_CARE_PROVIDER_SITE_OTHER): Payer: Medicare HMO | Admitting: Psychology

## 2020-03-24 DIAGNOSIS — F3289 Other specified depressive episodes: Secondary | ICD-10-CM | POA: Diagnosis not present

## 2020-03-24 DIAGNOSIS — F419 Anxiety disorder, unspecified: Secondary | ICD-10-CM | POA: Diagnosis not present

## 2020-04-01 ENCOUNTER — Ambulatory Visit (INDEPENDENT_AMBULATORY_CARE_PROVIDER_SITE_OTHER): Payer: Medicare HMO | Admitting: Family Medicine

## 2020-04-01 ENCOUNTER — Other Ambulatory Visit: Payer: Self-pay

## 2020-04-01 ENCOUNTER — Encounter (INDEPENDENT_AMBULATORY_CARE_PROVIDER_SITE_OTHER): Payer: Self-pay | Admitting: Family Medicine

## 2020-04-01 VITALS — BP 115/73 | HR 73 | Temp 98.1°F | Ht 59.0 in | Wt 192.0 lb

## 2020-04-01 DIAGNOSIS — E7849 Other hyperlipidemia: Secondary | ICD-10-CM

## 2020-04-01 DIAGNOSIS — R7303 Prediabetes: Secondary | ICD-10-CM

## 2020-04-01 DIAGNOSIS — E559 Vitamin D deficiency, unspecified: Secondary | ICD-10-CM

## 2020-04-01 DIAGNOSIS — R0602 Shortness of breath: Secondary | ICD-10-CM | POA: Diagnosis not present

## 2020-04-01 DIAGNOSIS — Z6838 Body mass index (BMI) 38.0-38.9, adult: Secondary | ICD-10-CM | POA: Diagnosis not present

## 2020-04-01 MED ORDER — VITAMIN D (ERGOCALCIFEROL) 1.25 MG (50000 UNIT) PO CAPS: 50000.0000 [IU] | ORAL_CAPSULE | ORAL | 0 refills | Status: DC

## 2020-04-02 LAB — INSULIN, RANDOM: INSULIN: 12.2 u[IU]/mL (ref 2.6–24.9)

## 2020-04-02 LAB — VITAMIN D 25 HYDROXY (VIT D DEFICIENCY, FRACTURES): Vit D, 25-Hydroxy: 45.8 ng/mL (ref 30.0–100.0)

## 2020-04-02 LAB — LIPID PANEL WITH LDL/HDL RATIO
Cholesterol, Total: 314 mg/dL — ABNORMAL HIGH (ref 100–199)
HDL: 76 mg/dL (ref >39)
LDL Chol Calc (NIH): 220 mg/dL — ABNORMAL HIGH (ref 0–99)
LDL/HDL Ratio: 2.9 ratio (ref 0.0–3.2)
Triglycerides: 106 mg/dL (ref 0–149)
VLDL Cholesterol Cal: 18 mg/dL (ref 5–40)

## 2020-04-02 LAB — HEMOGLOBIN A1C
Est. average glucose Bld gHb Est-mCnc: 114 mg/dL
Hgb A1c MFr Bld: 5.6 % (ref 4.8–5.6)

## 2020-04-06 NOTE — Progress Notes (Signed)
Chief Complaint:   OBESITY Darlene [name] is here to discuss her progress with her obesity treatment plan along with follow-up of her obesity related diagnoses. Darlene [name] is on the Category 1 Plan and states she is following her eating plan approximately 80% of the time. Darlene [name] states she is doing arm lifts, squats, and walking for 40 minutes 3 times per week.  Today's visit was #: 17 Starting weight: 213 lbs Starting date: 06/04/2019 Today's weight: 192 lbs Today's date: 04/01/2020 Total lbs lost to date: 21 Total lbs lost since last in-office visit: 3  Interim History: Darlene [name]'s IC done today shows RMR is 1120 versus it was 1357 at her first office visit on 06/04/2019. She does well on the plan at breakfast and dinner. She does not get protein in at lunch. She has lost 5.6 lbs of muscle mass since her first office visit.  Subjective:   1. Pre-diabetes Darlene [name] has a diagnosis of pre-diabetes based on her elevated Hgb A1c and was informed this puts her at greater risk of developing diabetes. She is not on metformin, and she notes polyphagia. Last A1c was 5.7. She continues to work on diet and exercise to decrease her risk of diabetes. She denies nausea or hypoglycemia.  Lab Results  Component Value Date   HGBA1C 5.6 04/01/2020   Lab Results  Component Value Date   INSULIN 12.2 04/01/2020   INSULIN 17.7 09/17/2019   INSULIN 10.7 06/04/2019   2. Vitamin D deficiency Darlene [name]'s last Vit D level was nearly at goal. She is on prescription Vit D.  3. Other hyperlipidemia Darlene [name] has hyperlipidemia and has been trying to improve her cholesterol levels with intensive lifestyle modification including a low saturated fat diet, exercise and weight loss. She is not on statin. Last LDL was lsightly elevated at 101, and HDL and triglycerides within normal limits. She denies any chest pain, claudication or myalgias.  Lab Results  Component Value Date   ALT 13 03/10/2020   AST 18 03/10/2020    ALKPHOS 85 03/10/2020   BILITOT 0.5 03/10/2020   Lab Results  Component Value Date   CHOL 314 (H) 04/01/2020   HDL 76 04/01/2020   LDLCALC 220 (H) 04/01/2020   TRIG 106 04/01/2020   CHOLHDL 3 03/01/2018   Shortness of Breath Darlene [name] notes increasing shortness of breath with exercising and seems to be worsening over time. She notes getting out of breath sooner with activity than she used to. This has not gotten worse recently. Darlene [name] denies shortness of breath at rest or orthopnea. Assessment/Plan:   1. Pre-diabetes Darlene [name] will continue to work on weight loss, exercise, and decreasing simple carbohydrates to help decrease the risk of diabetes. We will check labs today.  - Hemoglobin A1c - Insulin, random  2. Vitamin D deficiency Low Vitamin D level contributes to fatigue and are associated with obesity, breast, and colon cancer. We will check labs today, and we will refill prescription Vitamin D for 1 month. Darlene [name] will follow-up for routine testing of Vitamin D, at least 2-3 times per year to avoid over-replacement.  - VITAMIN D 25 Hydroxy (Vit-D Deficiency, Fractures)  - Vitamin D, Ergocalciferol, (DRISDOL) 1.25 MG (50000 UNIT) CAPS capsule; Take 1 capsule (50,000 Units total) by mouth every 7 (seven) days.  Dispense: 4 capsule; Refill: 0  3. Other hyperlipidemia Cardiovascular risk and specific lipid/LDL goals reviewed. We discussed several lifestyle modifications today and Darlene [name] will continue to work on diet, exercise and weight loss efforts. We will  check labs today.   Shortness of Breath Darlene [name] does not feel that she gets out of breath more easily that she used to when she exercises. Darlene [name]'s shortness of breath appears to be obesity related and exercise induced. She has agreed to work on weight loss and gradually increase exercise to treat her exercise induced shortness of breath. Will continue to monitor closely. IC today.  4. Class 2 severe obesity with serious  comorbidity and body mass index (BMI) of 38.0 to 38.9 in adult, unspecified obesity type Darlene [name]) Darlene [name] is currently in the action stage of change. As such, her goal is to continue with weight loss efforts. She has agreed to the Category 1 Plan and keeping a food journal and adhering to recommended goals of 300-400 calories and 25 grams of protein at lunch daily.   Darlene [name] will take lunch in a cooler when running errands.   Exercise goals: As is, focus on strength training.  Behavioral modification strategies: increasing lean protein intake, no skipping meals and meal planning and cooking strategies.  Darlene [name] has agreed to follow-up with our clinic in 3 weeks. She was informed of the importance of frequent follow-up visits to maximize her success with intensive lifestyle modifications for her multiple health conditions.   Darlene [name] was informed we would discuss her lab results at her next visit unless there is a critical issue that needs to be addressed sooner. Darlene [name] agreed to keep her next visit at the agreed upon time to discuss these results.  Objective:   Blood pressure 115/73, pulse 73, temperature 98.1 F (36.7 C), temperature source Oral, height 4\' 11"  (1.499 m), weight 192 lb (87.1 kg), SpO2 98 %. Body mass index is 38.78 kg/m.  General: Cooperative, alert, well developed, in no acute distress. HEENT: Conjunctivae and lids unremarkable. Cardiovascular: Regular rhythm.  Lungs: Normal work of breathing. Neurologic: No focal deficits.   Lab Results  Component Value Date   CREATININE 0.77 03/10/2020   BUN 19 03/10/2020   NA 140 03/10/2020   K 3.3 (L) 03/10/2020   CL 105 03/10/2020   CO2 26 03/10/2020   Lab Results  Component Value Date   ALT 13 03/10/2020   AST 18 03/10/2020   ALKPHOS 85 03/10/2020   BILITOT 0.5 03/10/2020   Lab Results  Component Value Date   HGBA1C 5.6 04/01/2020   HGBA1C 5.7 (H) 09/17/2019   HGBA1C 5.9 (H) 06/04/2019   HGBA1C 5.9 03/01/2018    HGBA1C 5.8 05/08/2017   Lab Results  Component Value Date   INSULIN 12.2 04/01/2020   INSULIN 17.7 09/17/2019   INSULIN 10.7 06/04/2019   Lab Results  Component Value Date   TSH 1.620 06/04/2019   Lab Results  Component Value Date   CHOL 314 (H) 04/01/2020   HDL 76 04/01/2020   LDLCALC 220 (H) 04/01/2020   TRIG 106 04/01/2020   CHOLHDL 3 03/01/2018   Lab Results  Component Value Date   WBC 7.3 03/10/2020   HGB 13.1 03/10/2020   HCT 42.8 03/10/2020   MCV 89.5 03/10/2020   PLT 345 03/10/2020   No results found for: IRON, TIBC, FERRITIN  Obesity Behavioral Intervention Documentation for Insurance:   Approximately 15 minutes were spent on the discussion below.  ASK: We discussed the diagnosis of obesity with 03/12/2020 today and Ulla agreed to give [name] permission to discuss obesity behavioral modification therapy today.  ASSESS: Lira has the diagnosis of obesity and her BMI today is 38.76. Dua is in the action stage  of change.   ADVISE: Ling was educated on the multiple health risks of obesity as well as the benefit of weight loss to improve her health. She was advised of the need for long term treatment and the importance of lifestyle modifications to improve her current health and to decrease her risk of future health problems.  AGREE: Multiple dietary modification options and treatment options were discussed and Destaney agreed to follow the recommendations documented in the above note.  ARRANGE: Scheryl was educated on the importance of frequent visits to treat obesity as outlined per CMS and USPSTF guidelines and agreed to schedule her next follow up appointment today.  Attestation Statements:   Reviewed by clinician on day of visit: allergies, medications, problem list, medical history, surgical history, family history, social history, and previous encounter notes.   [name], am acting as Energy manager for [place], FNP-C.  I have  reviewed the above documentation for accuracy and completeness, and I agree with the above. -  Jesse Sans, FNP

## 2020-04-13 ENCOUNTER — Ambulatory Visit (INDEPENDENT_AMBULATORY_CARE_PROVIDER_SITE_OTHER): Payer: Medicare HMO | Admitting: Psychology

## 2020-04-13 ENCOUNTER — Other Ambulatory Visit: Payer: Self-pay

## 2020-04-13 DIAGNOSIS — F3289 Other specified depressive episodes: Secondary | ICD-10-CM | POA: Diagnosis not present

## 2020-04-13 DIAGNOSIS — F419 Anxiety disorder, unspecified: Secondary | ICD-10-CM | POA: Diagnosis not present

## 2020-04-13 NOTE — Progress Notes (Signed)
  Office: [phone]  /  Fax: [phone]    Date: April 13, 2020    Time Seen: 2:01pm Duration: 23 minutes Provider: Lawerance Cruel, Psy.D. Type of Session: Individual Therapy  Type of Contact: Face-to-face  Session Content: Elianis is a 69 y.o. female presenting for a follow-up appointment to address the previously established treatment goal of increasing coping skills. The session was initiated with a brief check-in. Rosealynn shared deviations from the meal plan, especially when frustrated. This was further explored. Emotional and physical hunger were reviewed. Psychoeducation regarding triggers for emotional eating was provided. Sarah-Jane was provided a handout, and encouraged to utilize the handout between now and the next appointment to increase awareness of triggers and frequency. [name] agreed. This provider also discussed behavioral strategies for specific triggers, such as placing the utensil down when conversing to avoid mindless eating. Milessa was receptive to today's appointment as evidenced by openness to sharing, responsiveness to feedback, and willingness to explore triggers for emotional eating.  Mental Status Examination:  Appearance: well groomed and appropriate hygiene  Behavior: appropriate to circumstances Mood: euthymic Affect: mood congruent Speech: normal in rate, volume, and tone Eye Contact: appropriate Psychomotor Activity: appropriate Gait: ambulates with cane Thought Process: linear, logical, and goal directed  Thought Content/Perception: no hallucinations, delusions, bizarre thinking or behavior reported or observed and no evidence of suicidal and homicidal ideation, plan, and intent Orientation: time, person, place, and purpose of appointment Memory/Concentration: memory, attention, language, and fund of knowledge intact  Insight/Judgment: good   Interventions:  Conducted a brief chart review Provided empathic reflections and validation Reviewed content  from the previous session Employed supportive psychotherapy interventions to facilitate reduced distress and to improve coping skills with identified stressors Psychoeducation provided regarding triggers for emotional eating  DSM-5 Diagnosis: 311 (F32.8) Other Specified Depressive Disorder, Emotional Eating Behaviors and 300.00 (F41.9) Unspecified Anxiety Disorder  Treatment Goal & Progress: During the initial appointment with this provider, the following treatment goal was established: increase coping skills. Jeanifer has demonstrated progress in her goal as evidenced by increased awareness of hunger patterns.  Plan: The next appointment will be scheduled in approximately two weeks. The next session will focus on working towards the established treatment goal.

## 2020-04-14 NOTE — Progress Notes (Unsigned)
  Office: [phone]  /  Fax: [phone]    Date: April 28, 2020   Time Seen:*** Duration: *** minutes Provider: Lawerance Cruel, Psy.D. Type of Session: Individual Therapy  Type of Contact: Face-to-face  Session Content: Darlene [name] is a 69 y.o. female presenting for a follow-up appointment to address the previously established treatment goal of increasing coping skills. The session was initiated with the administration of the PHQ-9 and GAD-7, as well as a brief check-in. *** Darlene [name] was receptive to today's appointment as evidenced by openness to sharing, responsiveness to feedback, and {gbreceptiveness:23401}.  Mental Status Examination:  Appearance: {Appearance:22431} Behavior: {Behavior:22445} Mood: {gbmood:21757} Affect: {Affect:22436} Speech: {Speech:22432} Eye Contact: {Eye Contact:22433} Psychomotor Activity: {Motor Activity:22434} Gait: {gbgait:23404} Thought Process: {thought process:22448}  Thought Content/Perception: {disturbances:22451} Orientation: {Orientation:22437} Memory/Concentration: {gbcognition:22449} Insight/Judgment: {Insight:22446}  Structured Assessments Results: The Patient Health Questionnaire-9 (PHQ-9) is a self-report measure that assesses symptoms and severity of depression over the course of the last two weeks. Darlene [name] obtained a score of *** suggesting {GBPHQ9SEVERITY:21752}. Darlene [name] finds the endorsed symptoms to be {gbphq9difficulty:21754}. [0= Not at all; 1= Several days; 2= More than half the days; 3= Nearly every day] Little interest or pleasure in doing things ***  Feeling down, depressed, or hopeless ***  Trouble falling or staying asleep, or sleeping too much ***  Feeling tired or having little energy ***  Poor appetite or overeating ***  Feeling bad about yourself --- or that you are a failure or have let yourself or your family down ***  Trouble concentrating on things, such as reading the newspaper or watching television ***  Moving or  speaking so slowly that other people could have noticed? Or the opposite --- being so fidgety or restless that you have been moving around a lot more than usual ***  Thoughts that you would be better off dead or hurting yourself in some way ***  PHQ-9 Score ***    The Generalized Anxiety Disorder-7 (GAD-7) is a brief self-report measure that assesses symptoms of anxiety over the course of the last two weeks. Darlene [name] obtained a score of *** suggesting {gbgad7severity:21753}. Darlene [name] finds the endorsed symptoms to be {gbphq9difficulty:21754}. 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 Darlene [name] has demonstrated progress in her goal as evidenced by {gbtxprogress:22839}. Darlene [name] also {gbtxprogress2:22951}.  Plan: The next appointment will be scheduled in {gbweeks:21758}. The next session will focus on {Plan for Next Appointment:23400}.

## 2020-04-23 ENCOUNTER — Encounter (INDEPENDENT_AMBULATORY_CARE_PROVIDER_SITE_OTHER): Payer: Self-pay | Admitting: Family Medicine

## 2020-04-23 ENCOUNTER — Other Ambulatory Visit: Payer: Self-pay

## 2020-04-23 ENCOUNTER — Ambulatory Visit (INDEPENDENT_AMBULATORY_CARE_PROVIDER_SITE_OTHER): Payer: Medicare HMO | Admitting: Family Medicine

## 2020-04-23 VITALS — BP 135/81 | HR 62 | Temp 98.1°F | Ht 59.0 in | Wt 195.0 lb

## 2020-04-23 DIAGNOSIS — E7849 Other hyperlipidemia: Secondary | ICD-10-CM | POA: Insufficient documentation

## 2020-04-23 DIAGNOSIS — E559 Vitamin D deficiency, unspecified: Secondary | ICD-10-CM

## 2020-04-23 DIAGNOSIS — Z6839 Body mass index (BMI) 39.0-39.9, adult: Secondary | ICD-10-CM | POA: Diagnosis not present

## 2020-04-23 MED ORDER — VITAMIN D (ERGOCALCIFEROL) 1.25 MG (50000 UNIT) PO CAPS: 50000.0000 [IU] | ORAL_CAPSULE | ORAL | 0 refills | Status: DC

## 2020-04-23 NOTE — Progress Notes (Signed)
Chief Complaint:   OBESITY Darlene [name] is here to discuss her progress with her obesity treatment plan along with follow-up of her obesity related diagnoses. Darlene [name] is on the Category 1 Plan and keeping a food journal and adhering to recommended goals of 300-400 calories and 25 grams of protein at lunch daily and states she is following her eating plan approximately 75-80% of the time. Darlene [name] states she is walking and doing arm lifts and squats for 15 minutes 2-3 times per week.  Today's visit was #: 18 Starting weight: 213 lbs Starting date: 06/04/2019 Today's weight: 195 lbs Today's date: 04/23/2020 Total lbs lost to date: 24 Total lbs lost since last in-office visit: 0  Interim History: Darlene [name] is eating Category 1 plan but she is adding extra protein which adds extra calories. She is up 3 lbs today.  Subjective:   1. Vitamin D deficiency Darlene [name]'s Vit D level is slightly low at 45. She is on prescription Vit D. I discussed labs with the patient today.  2. Other hyperlipidemia Darlene [name] has hyperlipidemia and has been trying to improve her cholesterol levels with intensive lifestyle modification including a low saturated fat diet, exercise and weight loss. She is not on statin. Last LDL was 220, up from 102 (worsening). It is possibly a labs error. She denies any chest pain, claudication or myalgias. I discussed labs with the patient today.  Lab Results  Component Value Date   ALT 13 03/10/2020   AST 18 03/10/2020   ALKPHOS 85 03/10/2020   BILITOT 0.5 03/10/2020   Lab Results  Component Value Date   CHOL 314 (H) 04/01/2020   HDL 76 04/01/2020   LDLCALC 220 (H) 04/01/2020   TRIG 106 04/01/2020   CHOLHDL 3 03/01/2018   Assessment/Plan:   1. Vitamin D deficiency Low Vitamin D level contributes to fatigue and are associated with obesity, breast, and colon cancer. We will refill prescription Vitamin D for 1 month. Darlene [name] will follow-up for routine testing of Vitamin D, at  least 2-3 times per year to avoid over-replacement.  - Vitamin D, Ergocalciferol, (DRISDOL) 1.25 MG (50000 UNIT) CAPS capsule; Take 1 capsule (50,000 Units total) by mouth every 7 (seven) days.  Dispense: 4 capsule; Refill: 0  2. Other hyperlipidemia Cardiovascular risk and specific lipid/LDL goals reviewed. We discussed several lifestyle modifications today. Darlene [name] will continue to work on diet, exercise and weight loss efforts. We will recheck labs in 1 month.   3. Class 2 severe obesity with serious comorbidity and body mass index (BMI) of 39.0 to 39.9 in adult, unspecified obesity type [facility]) Darlene [name] is currently in the action stage of change. As such, her goal is to continue with weight loss efforts. She has agreed to the Category 1 Plan.   Darlene [name] will not add protein to the plan and will follow the Category 1 plan exactly.  Exercise goals: As is.  Behavioral modification strategies: planning for success.  Darlene [name] has agreed to follow-up with our clinic in 2 weeks. She was informed of the importance of frequent follow-up visits to maximize her success with intensive lifestyle modifications for her multiple health conditions.   Objective:   Blood pressure (!) 135/81, pulse 62, temperature 98.1 F (36.7 C), temperature source Oral, height 4\' 11"  (1.499 m), weight 195 lb (88.5 kg), SpO2 (!) 88 %. Body mass index is 39.39 kg/m.  General: Cooperative, alert, well developed, in no acute distress. HEENT: Conjunctivae and lids unremarkable. Cardiovascular: Regular rhythm.  Lungs: Normal work  of breathing. Neurologic: No focal deficits.   Lab Results  Component Value Date   CREATININE 0.77 03/10/2020   BUN 19 03/10/2020   NA 140 03/10/2020   K 3.3 (L) 03/10/2020   CL 105 03/10/2020   CO2 26 03/10/2020   Lab Results  Component Value Date   ALT 13 03/10/2020   AST 18 03/10/2020   ALKPHOS 85 03/10/2020   BILITOT 0.5 03/10/2020   Lab Results  Component Value Date   HGBA1C  5.6 04/01/2020   HGBA1C 5.7 (H) 09/17/2019   HGBA1C 5.9 (H) 06/04/2019   HGBA1C 5.9 03/01/2018   HGBA1C 5.8 05/08/2017   Lab Results  Component Value Date   INSULIN 12.2 04/01/2020   INSULIN 17.7 09/17/2019   INSULIN 10.7 06/04/2019   Lab Results  Component Value Date   TSH 1.620 06/04/2019   Lab Results  Component Value Date   CHOL 314 (H) 04/01/2020   HDL 76 04/01/2020   LDLCALC 220 (H) 04/01/2020   TRIG 106 04/01/2020   CHOLHDL 3 03/01/2018   Lab Results  Component Value Date   WBC 7.3 03/10/2020   HGB 13.1 03/10/2020   HCT 42.8 03/10/2020   MCV 89.5 03/10/2020   PLT 345 03/10/2020   No results found for: IRON, TIBC, FERRITIN  Obesity Behavioral Intervention Documentation for Insurance:   Approximately 15 minutes were spent on the discussion below.  ASK: We discussed the diagnosis of obesity with Darlene [name] today and Darlene [name] agreed to give [place] permission to discuss obesity behavioral modification therapy today.  ASSESS: Darlene [name] has the diagnosis of obesity and her BMI today is 39.36. Darlene [name] is in the action stage of change.   ADVISE: Darlene [name] was educated on the multiple health risks of obesity as well as the benefit of weight loss to improve her health. She was advised of the need for long term treatment and the importance of lifestyle modifications to improve her current health and to decrease her risk of future health problems.  AGREE: Multiple dietary modification options and treatment options were discussed and Darlene [name] agreed to follow the recommendations documented in the above note.  ARRANGE: Darlene [name] was educated on the importance of frequent visits to treat obesity as outlined per CMS and USPSTF guidelines and agreed to schedule her next follow up appointment today.  Attestation Statements:   Reviewed by clinician on day of visit: allergies, medications, problem list, medical history, surgical history, family history, social history, and previous  encounter notes.   Darlene [name], am acting as Energy manager for [place], FNP-C.  I have reviewed the above documentation for accuracy and completeness, and I agree with the above. -  Jesse Sans, FNP

## 2020-04-23 NOTE — Progress Notes (Signed)
The 10-year ASCVD risk score Denman George DC Montez Hageman., et al., 2013) is: 16.4%   Values used to calculate the score:     Age: 69 years     Sex: Female     Is Non-Hispanic African American: Yes     Diabetic: No     Tobacco smoker: No     Systolic Blood Pressure: 135 mmHg     Is BP treated: Yes     HDL Cholesterol: 76 mg/dL     Total Cholesterol: 314 mg/dL

## 2020-04-28 ENCOUNTER — Ambulatory Visit (INDEPENDENT_AMBULATORY_CARE_PROVIDER_SITE_OTHER): Payer: Self-pay | Admitting: Psychology

## 2020-05-12 ENCOUNTER — Encounter (INDEPENDENT_AMBULATORY_CARE_PROVIDER_SITE_OTHER): Payer: Self-pay | Admitting: Family Medicine

## 2020-05-12 ENCOUNTER — Other Ambulatory Visit: Payer: Self-pay | Admitting: Orthopedic Surgery

## 2020-05-12 ENCOUNTER — Other Ambulatory Visit: Payer: Self-pay

## 2020-05-12 ENCOUNTER — Ambulatory Visit (INDEPENDENT_AMBULATORY_CARE_PROVIDER_SITE_OTHER): Payer: Medicare HMO | Admitting: Family Medicine

## 2020-05-12 VITALS — BP 113/70 | HR 54 | Temp 98.4°F | Ht 59.0 in | Wt 197.0 lb

## 2020-05-12 DIAGNOSIS — E559 Vitamin D deficiency, unspecified: Secondary | ICD-10-CM

## 2020-05-12 DIAGNOSIS — Z6839 Body mass index (BMI) 39.0-39.9, adult: Secondary | ICD-10-CM

## 2020-05-12 DIAGNOSIS — M25511 Pain in right shoulder: Secondary | ICD-10-CM

## 2020-05-12 DIAGNOSIS — E7849 Other hyperlipidemia: Secondary | ICD-10-CM

## 2020-05-12 MED ORDER — VITAMIN D (ERGOCALCIFEROL) 1.25 MG (50000 UNIT) PO CAPS: 50000.0000 [IU] | ORAL_CAPSULE | ORAL | 0 refills | Status: DC

## 2020-05-12 NOTE — Progress Notes (Signed)
Chief Complaint:   OBESITY Kyra is here to discuss her progress with her obesity treatment plan along with follow-up of her obesity related diagnoses. Makara is on the Category 1 Plan and states she is following her eating plan approximately 80-85% of the time. Hedy states she is doing 0 minutes 0 times per week.  Today's visit was #: 19 Starting weight: 213 lbs Starting date: 06/04/2019 Today's weight: 197 lbs Today's date: 05/12/2020 Total lbs lost to date: 22 Total lbs lost since last in-office visit: 0  Interim History: Addalyn is frustrated with lack of weight loss. She feels she has adhered to Category 1 very well. She needs to lose 10 lbs before her total knee replacement on 07/13/2020. She is up 2 lbs today.  Subjective:   1. Vitamin D deficiency Ernesta's Vit D is slightly low at 45. She is on prescription Vit D.   2. Other hyperlipidemia  Last LDL was very high at 220 (up from 101). Her HDL and triglycerides are within normal limits. She is not on statin. She denies any chest pain, claudication or myalgias.  Lab Results  Component Value Date   ALT 13 03/10/2020   AST 18 03/10/2020   ALKPHOS 85 03/10/2020   BILITOT 0.5 03/10/2020   Lab Results  Component Value Date   CHOL 314 (H) 04/01/2020   HDL 76 04/01/2020   LDLCALC 220 (H) 04/01/2020   TRIG 106 04/01/2020   CHOLHDL 3 03/01/2018   Assessment/Plan:   1. Vitamin D deficiency Low Vitamin D level contributes to fatigue and are associated with obesity, breast, and colon cancer. We will refill prescription Vitamin D for 1 month. Nioka will follow-up for routine testing of Vitamin D, at least 2-3 times per year to avoid over-replacement.  - Vitamin D, Ergocalciferol, (DRISDOL) 1.25 MG (50000 UNIT) CAPS capsule; Take 1 capsule (50,000 Units total) by mouth every 7 (seven) days.  Dispense: 4 capsule; Refill: 0  2. Other hyperlipidemia Cardiovascular risk and specific lipid/LDL goals reviewed. We  discussed several lifestyle modifications today. Fynlee will continue to work on diet, exercise and weight loss efforts. We will recheck FLP at her next office visit.   3. Class 2 severe obesity with serious comorbidity and body mass index (BMI) of 39.0 to 39.9 in adult, unspecified obesity type [facility]) Collie is currently in the action stage of change. As such, her goal is to continue with weight loss efforts. She has agreed to keeping a food journal and adhering to recommended goals of [phone] calories and 75-80 grams of protein daily.   Recipe: Chicken and bean soup.  Exercise goals: No exercise has been prescribed at this time.  Behavioral modification strategies: increasing lean protein intake, decreasing simple carbohydrates and keeping healthy foods in the home.  Sandrika has agreed to follow-up with our clinic in 2 weeks. She was informed of the importance of frequent follow-up visits to maximize her success with intensive lifestyle modifications for her multiple health conditions.   Objective:   Blood pressure 113/70, pulse (!) 54, temperature 98.4 F (36.9 C), temperature source Oral, height 4\' 11"  (1.499 m), weight 197 lb (89.4 kg), SpO2 99 %. Body mass index is 39.79 kg/m.  Riyah uses a cane for ambulation. General: Cooperative, alert, well developed, in no acute distress. HEENT: Conjunctivae and lids unremarkable. Cardiovascular: Regular rhythm.  Lungs: Normal work of breathing. Neurologic: No focal deficits.   Lab Results  Component Value Date   CREATININE 0.77 03/10/2020  BUN 19 03/10/2020   NA 140 03/10/2020   K 3.3 (L) 03/10/2020   CL 105 03/10/2020   CO2 26 03/10/2020   Lab Results  Component Value Date   ALT 13 03/10/2020   AST 18 03/10/2020   ALKPHOS 85 03/10/2020   BILITOT 0.5 03/10/2020   Lab Results  Component Value Date   HGBA1C 5.6 04/01/2020   HGBA1C 5.7 (H) 09/17/2019   HGBA1C 5.9 (H) 06/04/2019   HGBA1C 5.9 03/01/2018   HGBA1C 5.8  05/08/2017   Lab Results  Component Value Date   INSULIN 12.2 04/01/2020   INSULIN 17.7 09/17/2019   INSULIN 10.7 06/04/2019   Lab Results  Component Value Date   TSH 1.620 06/04/2019   Lab Results  Component Value Date   CHOL 314 (H) 04/01/2020   HDL 76 04/01/2020   LDLCALC 220 (H) 04/01/2020   TRIG 106 04/01/2020   CHOLHDL 3 03/01/2018   Lab Results  Component Value Date   WBC 7.3 03/10/2020   HGB 13.1 03/10/2020   HCT 42.8 03/10/2020   MCV 89.5 03/10/2020   PLT 345 03/10/2020   No results found for: IRON, TIBC, FERRITIN  Obesity Behavioral Intervention Documentation for Insurance:   Approximately 15 minutes were spent on the discussion below.  ASK: We discussed the diagnosis of obesity with [name] today and Devon agreed to give [place] permission to discuss obesity behavioral modification therapy today.  ASSESS: Fay has the diagnosis of obesity and her BMI today is 39.77. Daja is in the action stage of change.   ADVISE: Zaneta was educated on the multiple health risks of obesity as well as the benefit of weight loss to improve her health. She was advised of the need for long term treatment and the importance of lifestyle modifications to improve her current health and to decrease her risk of future health problems.  AGREE: Multiple dietary modification options and treatment options were discussed and Sentoria agreed to follow the recommendations documented in the above note.  ARRANGE: Temari was educated on the importance of frequent visits to treat obesity as outlined per CMS and USPSTF guidelines and agreed to schedule her next follow up appointment today.  Attestation Statements:   Reviewed by clinician on day of visit: allergies, medications, problem list, medical history, surgical history, family history, social history, and previous encounter notes.   [name], am acting as Energy manager for [place], FNP-C.  I have reviewed the  above documentation for accuracy and completeness, and I agree with the above. -  Jesse Sans, FNP

## 2020-05-17 ENCOUNTER — Other Ambulatory Visit: Payer: Self-pay | Admitting: Neurology

## 2020-05-26 ENCOUNTER — Other Ambulatory Visit: Payer: Self-pay

## 2020-05-26 ENCOUNTER — Encounter (INDEPENDENT_AMBULATORY_CARE_PROVIDER_SITE_OTHER): Payer: Self-pay | Admitting: Family Medicine

## 2020-05-26 ENCOUNTER — Ambulatory Visit (INDEPENDENT_AMBULATORY_CARE_PROVIDER_SITE_OTHER): Payer: Medicare HMO | Admitting: Family Medicine

## 2020-05-26 VITALS — BP 117/65 | HR 87 | Temp 98.1°F | Ht 59.0 in | Wt 194.0 lb

## 2020-05-26 DIAGNOSIS — R7303 Prediabetes: Secondary | ICD-10-CM | POA: Diagnosis not present

## 2020-05-26 DIAGNOSIS — Z6839 Body mass index (BMI) 39.0-39.9, adult: Secondary | ICD-10-CM

## 2020-05-26 NOTE — Progress Notes (Addendum)
Chief Complaint:   OBESITY Darlene [name] is here to discuss her progress with her obesity treatment plan along with follow-up of her obesity related diagnoses. [name] is on keeping a food journal and adhering to recommended goals of [phone] calories and 75-80 grams of protein daily and states she is following her eating plan approximately 80% of the time. [name] states she is doing stretch exercises and walking the halls at home for 20 minutes 2 times per week.  Today's visit was #: 20 Starting weight: 213 lbs Starting date: 06/04/2019 Today's weight: 194 lbs Today's date: 05/26/2020 Total lbs lost to date: 19 Total lbs lost since last in-office visit: 3  Interim History: Darlene [name] was switched to journaling from Category 1 at her last office visit. She has lost 3 lbs and is very happy with this. She is trying to lose 10 lbs before her TKR in October. She denies excessive hunger.  Subjective:   1. Pre-diabetes . She is not on metformin, and she denies polyphagia. She continues to work on diet and exercise to decrease her risk of diabetes.  Lab Results  Component Value Date   HGBA1C 5.6 04/01/2020   Lab Results  Component Value Date   INSULIN 12.2 04/01/2020   INSULIN 17.7 09/17/2019   INSULIN 10.7 06/04/2019   Assessment/Plan:   1. Pre-diabetes Darlene [name] will continue her meal plan, and will continue to work on weight loss, exercise, and decreasing simple carbohydrates to help decrease the risk of diabetes.   2. Class 2 severe obesity with serious comorbidity and body mass index (BMI) of 39.0 to 39.9 in adult, unspecified obesity type [name]) Darlene [name] is currently in the action stage of change. As such, her goal is to continue with weight loss efforts. She has agreed to the Category 1 Plan and keeping a food journal and adhering to recommended goals of [phone] calories and 75-80 protein.   Exercise goals: Increase walking to 5 times per week.  Behavioral modification strategies:  better snacking choices.  Darlene [name] has agreed to follow-up with our clinic in 2 weeks. She was informed of the importance of frequent follow-up visits to maximize her success with intensive lifestyle modifications for her multiple health conditions.   Objective:   Blood pressure 117/65, pulse 87, temperature 98.1 F (36.7 C), height 4\' 11"  (1.499 m), weight 194 lb (88 kg), SpO2 97 %. Body mass index is 39.18 kg/m.   Darlene [name] uses a cane for ambulation. General: Cooperative, alert, well developed, in no acute distress. HEENT: Conjunctivae and lids unremarkable. Cardiovascular: Regular rhythm.  Lungs: Normal work of breathing. Neurologic: No focal deficits.  MSK: Uses cane for ambulation.  Lab Results  Component Value Date   CREATININE 0.77 03/10/2020   BUN 19 03/10/2020   NA 140 03/10/2020   K 3.3 (L) 03/10/2020   CL 105 03/10/2020   CO2 26 03/10/2020   Lab Results  Component Value Date   ALT 13 03/10/2020   AST 18 03/10/2020   ALKPHOS 85 03/10/2020   BILITOT 0.5 03/10/2020   Lab Results  Component Value Date   HGBA1C 5.6 04/01/2020   HGBA1C 5.7 (H) 09/17/2019   HGBA1C 5.9 (H) 06/04/2019   HGBA1C 5.9 03/01/2018   HGBA1C 5.8 05/08/2017   Lab Results  Component Value Date   INSULIN 12.2 04/01/2020   INSULIN 17.7 09/17/2019   INSULIN 10.7 06/04/2019   Lab Results  Component Value Date   TSH 1.620 06/04/2019   Lab Results  Component Value Date  CHOL 314 (H) 04/01/2020   HDL 76 04/01/2020   LDLCALC 220 (H) 04/01/2020   TRIG 106 04/01/2020   CHOLHDL 3 03/01/2018   Lab Results  Component Value Date   WBC 7.3 03/10/2020   HGB 13.1 03/10/2020   HCT 42.8 03/10/2020   MCV 89.5 03/10/2020   PLT 345 03/10/2020   No results found for: IRON, TIBC, FERRITIN  Obesity Behavioral Intervention Documentation for Insurance:   Approximately 15 minutes were spent on the discussion below.  ASK: We discussed the diagnosis of obesity with Darlene [name] today and Darlene [name]  agreed to give [place] permission to discuss obesity behavioral modification therapy today.  ASSESS: Harmoney has the diagnosis of obesity and her BMI today is 39.16. Darlene [name] is in the action stage of change.   ADVISE: Darlene [name] was educated on the multiple health risks of obesity as well as the benefit of weight loss to improve her health. She was advised of the need for long term treatment and the importance of lifestyle modifications to improve her current health and to decrease her risk of future health problems.  AGREE: Multiple dietary modification options and treatment options were discussed and Darlene [name] agreed to follow the recommendations documented in the above note.  ARRANGE: Darlene [name] was educated on the importance of frequent visits to treat obesity as outlined per CMS and USPSTF guidelines and agreed to schedule her next follow up appointment today.  Attestation Statements:   Reviewed by clinician on day of visit: allergies, medications, problem list, medical history, surgical history, family history, social history, and previous encounter notes.   Darlene [name], am acting as Energy manager for [place], FNP-C.  I have reviewed the above documentation for accuracy and completeness, and I agree with the above. -  Jesse Sans, FNP

## 2020-05-28 ENCOUNTER — Ambulatory Visit: Payer: Medicare HMO | Admitting: Adult Health

## 2020-06-02 ENCOUNTER — Other Ambulatory Visit: Payer: Medicare HMO

## 2020-06-03 ENCOUNTER — Other Ambulatory Visit: Payer: Self-pay | Admitting: Family Medicine

## 2020-06-03 DIAGNOSIS — Z13820 Encounter for screening for osteoporosis: Secondary | ICD-10-CM

## 2020-06-03 DIAGNOSIS — Z1231 Encounter for screening mammogram for malignant neoplasm of breast: Secondary | ICD-10-CM

## 2020-06-09 ENCOUNTER — Encounter (INDEPENDENT_AMBULATORY_CARE_PROVIDER_SITE_OTHER): Payer: Self-pay | Admitting: Family Medicine

## 2020-06-09 ENCOUNTER — Ambulatory Visit (INDEPENDENT_AMBULATORY_CARE_PROVIDER_SITE_OTHER): Payer: Medicare HMO | Admitting: Family Medicine

## 2020-06-09 ENCOUNTER — Other Ambulatory Visit: Payer: Self-pay

## 2020-06-09 ENCOUNTER — Other Ambulatory Visit (INDEPENDENT_AMBULATORY_CARE_PROVIDER_SITE_OTHER): Payer: Self-pay | Admitting: Family Medicine

## 2020-06-09 VITALS — BP 118/72 | HR 72 | Temp 97.9°F | Ht 59.0 in | Wt 195.0 lb

## 2020-06-09 DIAGNOSIS — R7303 Prediabetes: Secondary | ICD-10-CM

## 2020-06-09 DIAGNOSIS — Z6839 Body mass index (BMI) 39.0-39.9, adult: Secondary | ICD-10-CM | POA: Diagnosis not present

## 2020-06-09 DIAGNOSIS — E559 Vitamin D deficiency, unspecified: Secondary | ICD-10-CM | POA: Diagnosis not present

## 2020-06-09 DIAGNOSIS — E66812 Obesity, class 2: Secondary | ICD-10-CM

## 2020-06-09 MED ORDER — METFORMIN HCL 500 MG PO TABS: 500.0000 mg | ORAL_TABLET | Freq: Every day | ORAL | 0 refills | Status: DC

## 2020-06-09 MED ORDER — VITAMIN D (ERGOCALCIFEROL) 1.25 MG (50000 UNIT) PO CAPS: 50000.0000 [IU] | ORAL_CAPSULE | ORAL | 0 refills | Status: DC

## 2020-06-10 ENCOUNTER — Encounter (INDEPENDENT_AMBULATORY_CARE_PROVIDER_SITE_OTHER): Payer: Self-pay | Admitting: Family Medicine

## 2020-06-10 NOTE — Progress Notes (Signed)
Chief Complaint:   OBESITY Ammy is here to discuss her progress with her obesity treatment plan along with follow-up of her obesity related diagnoses. Nobie is on the Category 1 Plan or keeping a food journal and adhering to recommended goals of [phone] calories and 75-80 grams of protein daily and states she is following her eating plan approximately 80% of the time. Kiyana states she is doing squats and walking for 15 minutes 3 times per week.  Today's visit was #: 21 Starting weight: 213 lbs Starting date: 06/04/2019 Today's weight: 195 lbs Today's date: 06/09/2020 Total lbs lost to date: 18 Total lbs lost since last in-office visit: 0  Interim History: Shrika is journaling daily and averaging between 950 and 1150 calories and 80 grams of protein daily. She is working to lose 10 lbs by the date of her left total knee replacement surgery- October 18th. She is exercising several days per week including water aerobics 2 times per week for 1 hour. She is up 1 lb today and she is disappointed.  Subjective:   1. Vitamin D deficiency Blake's Vit D level is nearly at goal. She is on prescription Vit D.  2. Pre-diabetes . She is not on metformin, and last A1c was 5.6. She continues to work on diet and exercise to decrease her risk of diabetes.   Lab Results  Component Value Date   HGBA1C 5.6 04/01/2020   Lab Results  Component Value Date   INSULIN 12.2 04/01/2020   INSULIN 17.7 09/17/2019   INSULIN 10.7 06/04/2019   Assessment/Plan:   1. Vitamin D deficiency  We will refill prescription Vitamin D for 1 month.  - Vitamin D, Ergocalciferol, (DRISDOL) 1.25 MG (50000 UNIT) CAPS capsule; Take 1 capsule (50,000 Units total) by mouth every 7 (seven) days.  Dispense: 4 capsule; Refill: 0  2. Pre-diabetes  Naveh agreed to start metformin 500 mg q AM with no refills.  - metFORMIN (GLUCOPHAGE) 500 MG tablet; Take 1 tablet (500 mg total) by mouth daily with breakfast.   Dispense: 30 tablet; Refill: 0  3. Class 2 severe obesity with serious comorbidity and body mass index (BMI) of 39.0 to 39.9 in adult, unspecified obesity type [facility] Analyah is currently in the action stage of change. As such, her goal is to continue with weight loss efforts. She has agreed to keeping a food journal and adhering to recommended goals of [phone] calories and 80 grams of protein daily.   Encouragement was provided.  Exercise goals: As is.  Behavioral modification strategies: keeping a strict food journal.  Jacoya has agreed to follow-up with our clinic in 2 weeks.   Objective:   Blood pressure 118/72, pulse 72, temperature 97.9 F (36.6 C), temperature source Oral, height 4\' 11"  (1.499 m), weight 195 lb (88.5 kg), SpO2 97 %. Body mass index is 39.39 kg/m.  General: Cooperative, alert, well developed, in no acute distress. HEENT: Conjunctivae and lids unremarkable. Cardiovascular: Regular rhythm.  Lungs: Normal work of breathing. Neurologic: No focal deficits.   Lab Results  Component Value Date   CREATININE 0.77 03/10/2020   BUN 19 03/10/2020   NA 140 03/10/2020   K 3.3 (L) 03/10/2020   CL 105 03/10/2020   CO2 26 03/10/2020   Lab Results  Component Value Date   ALT 13 03/10/2020   AST 18 03/10/2020   ALKPHOS 85 03/10/2020   BILITOT 0.5 03/10/2020   Lab Results  Component Value Date   HGBA1C 5.6 04/01/2020  HGBA1C 5.7 (H) 09/17/2019   HGBA1C 5.9 (H) 06/04/2019   HGBA1C 5.9 03/01/2018   HGBA1C 5.8 05/08/2017   Lab Results  Component Value Date   INSULIN 12.2 04/01/2020   INSULIN 17.7 09/17/2019   INSULIN 10.7 06/04/2019   Lab Results  Component Value Date   TSH 1.620 06/04/2019   Lab Results  Component Value Date   CHOL 314 (H) 04/01/2020   HDL 76 04/01/2020   LDLCALC 220 (H) 04/01/2020   TRIG 106 04/01/2020   CHOLHDL 3 03/01/2018   Lab Results  Component Value Date   WBC 7.3 03/10/2020   HGB 13.1 03/10/2020   HCT 42.8 03/10/2020    MCV 89.5 03/10/2020   PLT 345 03/10/2020   No results found for: IRON, TIBC, FERRITIN  Obesity Behavioral Intervention:   Approximately 15 minutes were spent on the discussion below.  ASK: We discussed the diagnosis of obesity with [name] today and Robertta agreed to give [place] permission to discuss obesity behavioral modification therapy today.  ASSESS: Aaren has the diagnosis of obesity and her BMI today is 39.36. Jossalyn is in the action stage of change.   ADVISE: Diondra was educated on the multiple health risks of obesity as well as the benefit of weight loss to improve her health. She was advised of the need for long term treatment and the importance of lifestyle modifications to improve her current health and to decrease her risk of future health problems.  AGREE: Multiple dietary modification options and treatment options were discussed and Shera agreed to follow the recommendations documented in the above note.  ARRANGE: Athalee was educated on the importance of frequent visits to treat obesity as outlined per CMS and USPSTF guidelines and agreed to schedule her next follow up appointment today.  Attestation Statements:   Reviewed by clinician on day of visit: allergies, medications, problem list, medical history, surgical history, family history, social history, and previous encounter notes.   [name], am acting as Energy manager for [place], FNP-C.  I have reviewed the above documentation for accuracy and completeness, and I agree with the above. -  Jesse Sans, FNP

## 2020-06-23 ENCOUNTER — Other Ambulatory Visit: Payer: Self-pay

## 2020-06-23 ENCOUNTER — Encounter (INDEPENDENT_AMBULATORY_CARE_PROVIDER_SITE_OTHER): Payer: Self-pay | Admitting: Family Medicine

## 2020-06-23 ENCOUNTER — Ambulatory Visit (INDEPENDENT_AMBULATORY_CARE_PROVIDER_SITE_OTHER): Payer: Medicare HMO | Admitting: Family Medicine

## 2020-06-23 VITALS — BP 124/77 | HR 73 | Temp 98.0°F | Ht 59.0 in | Wt 197.0 lb

## 2020-06-23 DIAGNOSIS — E559 Vitamin D deficiency, unspecified: Secondary | ICD-10-CM

## 2020-06-23 DIAGNOSIS — E7849 Other hyperlipidemia: Secondary | ICD-10-CM | POA: Diagnosis not present

## 2020-06-23 DIAGNOSIS — R7303 Prediabetes: Secondary | ICD-10-CM

## 2020-06-23 DIAGNOSIS — Z6841 Body Mass Index (BMI) 40.0 and over, adult: Secondary | ICD-10-CM

## 2020-06-23 MED ORDER — VITAMIN D (ERGOCALCIFEROL) 1.25 MG (50000 UNIT) PO CAPS: 50000.0000 [IU] | ORAL_CAPSULE | ORAL | 0 refills | Status: DC

## 2020-06-23 MED ORDER — OZEMPIC (0.25 OR 0.5 MG/DOSE) 2 MG/1.5ML ~~LOC~~ SOPN: 0.2500 mg | PEN_INJECTOR | SUBCUTANEOUS | 0 refills | Status: DC

## 2020-06-24 ENCOUNTER — Encounter (INDEPENDENT_AMBULATORY_CARE_PROVIDER_SITE_OTHER): Payer: Self-pay | Admitting: Family Medicine

## 2020-06-24 LAB — COMPREHENSIVE METABOLIC PANEL
ALT: 11 IU/L (ref 0–32)
AST: 16 IU/L (ref 0–40)
Albumin/Globulin Ratio: 1.8 (ref 1.2–2.2)
Albumin: 4.4 g/dL (ref 3.8–4.8)
Alkaline Phosphatase: 98 IU/L (ref 44–121)
BUN/Creatinine Ratio: 18 (ref 12–28)
BUN: 13 mg/dL (ref 8–27)
Bilirubin Total: 0.3 mg/dL (ref 0.0–1.2)
CO2: 26 mmol/L (ref 20–29)
Calcium: 9.3 mg/dL (ref 8.7–10.3)
Chloride: 106 mmol/L (ref 96–106)
Creatinine, Ser: 0.73 mg/dL (ref 0.57–1.00)
GFR calc Af Amer: 99 mL/min/{1.73_m2} (ref >59)
GFR calc non Af Amer: 85 mL/min/{1.73_m2} (ref >59)
Globulin, Total: 2.5 g/dL (ref 1.5–4.5)
Glucose: 93 mg/dL (ref 65–99)
Potassium: 4.3 mmol/L (ref 3.5–5.2)
Sodium: 145 mmol/L — ABNORMAL HIGH (ref 134–144)
Total Protein: 6.9 g/dL (ref 6.0–8.5)

## 2020-06-24 LAB — LIPID PANEL WITH LDL/HDL RATIO
Cholesterol, Total: 225 mg/dL — ABNORMAL HIGH (ref 100–199)
HDL: 73 mg/dL (ref >39)
LDL Chol Calc (NIH): 131 mg/dL — ABNORMAL HIGH (ref 0–99)
LDL/HDL Ratio: 1.8 ratio (ref 0.0–3.2)
Triglycerides: 123 mg/dL (ref 0–149)
VLDL Cholesterol Cal: 21 mg/dL (ref 5–40)

## 2020-06-24 LAB — HEMOGLOBIN A1C
Est. average glucose Bld gHb Est-mCnc: 123 mg/dL
Hgb A1c MFr Bld: 5.9 % — ABNORMAL HIGH (ref 4.8–5.6)

## 2020-06-24 LAB — VITAMIN D 25 HYDROXY (VIT D DEFICIENCY, FRACTURES): Vit D, 25-Hydroxy: 44.5 ng/mL (ref 30.0–100.0)

## 2020-06-24 LAB — INSULIN, RANDOM: INSULIN: 10.5 u[IU]/mL (ref 2.6–24.9)

## 2020-06-24 NOTE — Progress Notes (Signed)
Chief Complaint:   OBESITY Amaia is here to discuss her progress with her obesity treatment plan along with follow-up of her obesity related diagnoses. Saraiyah is on keeping a food journal and adhering to recommended goals of [phone] calories and 80 grams of protein daily and states she is following her eating plan approximately 50% of the time. Floella states she is walking and doing strength and cardio for 30 minutes 2-4 times per week.  Today's visit was #: 22 Starting weight: 213 lbs Starting date: 06/04/2019 Today's weight: 197 lbs Today's date: 06/23/2020 Total lbs lost to date: 16 Total lbs lost since last in-office visit: 0  Interim History: Jeena is up 2 lbs today. She is journaling daily and averaging between 1200-1250 calories per day. Her protein averages 80 or more grams per day. She feels metformin made her feel hungrier.  Subjective:   1. Pre-diabetes Janeva notes polyphagia. She feels metformin increases her appetite.  Lab Results  Component Value Date   HGBA1C 5.6 04/01/2020   Lab Results  Component Value Date   INSULIN 12.2 04/01/2020   INSULIN 17.7 09/17/2019   INSULIN 10.7 06/04/2019   2. Other hyperlipidemia Annalisia is not on a statin. Last LDL was very elevated at 220 which was out of the ordinary for her.   Lab Results  Component Value Date   ALT 13 03/10/2020   AST 18 03/10/2020   ALKPHOS 85 03/10/2020   BILITOT 0.5 03/10/2020   Lab Results  Component Value Date   CHOL 314 (H) 04/01/2020   HDL 76 04/01/2020   LDLCALC 220 (H) 04/01/2020   TRIG 106 04/01/2020   CHOLHDL 3 03/01/2018   3. Vitamin D deficiency Kourtlynn is on Vit D prescription, and last Vit D level was low at 45.  Assessment/Plan:   1. Pre-diabetes Oleva will continue to work on weight loss, exercise, and decreasing simple carbohydrates to help decrease the risk of diabetes. Jaylie agreed to discontinue metformin, and start Ozempic 0.25 mg SubQ weekly, with no  refills. We will check labs today. Tinya denies personal or family history of thyroid cancer, history of pancreatitis, or current cholelithiasis. She was informed of side effects.  - Comprehensive metabolic panel - Hemoglobin A1c - Insulin, random - Semaglutide,0.25 or 0.5MG /DOS, (OZEMPIC, 0.25 OR 0.5 MG/DOSE,) 2 MG/1.5ML SOPN; Inject 0.25 mg into the skin once a week.  Dispense: 1.5 mL; Refill: 0  2. Other hyperlipidemia Cardiovascular risk and specific lipid/LDL goals reviewed. We discussed several lifestyle modifications today and Florice will continue to work on diet, exercise and weight loss efforts. We will check labs today. - Lipid Panel With LDL/HDL Ratio  3. Vitamin D deficiency  We will check labs today, and we will refill prescription Vitamin D for 1 month. - VITAMIN D 25 Hydroxy (Vit-D Deficiency, Fractures) - Vitamin D, Ergocalciferol, (DRISDOL) 1.25 MG (50000 UNIT) CAPS capsule; Take 1 capsule (50,000 Units total) by mouth every 7 (seven) days.  Dispense: 4 capsule; Refill: 0  4. Class 3 severe obesity with serious comorbidity and body mass index (BMI) of 40.0 to 44.9 in adult, unspecified obesity type [facility]) Amazin is currently in the action stage of change. As such, her goal is to continue with weight loss efforts. She has agreed to keeping a food journal and adhering to recommended goals of [phone] calories and 80 grams of protein daily.   Exercise goals: As is.  Behavioral modification strategies: decreasing simple carbohydrates and keeping a strict food journal.  Dinna has agreed to follow-up with our clinic in 2 to 3 weeks. Terresa was informed we would discuss her lab results at her next visit unless there is a critical issue that needs to be addressed sooner. Ki agreed to keep her next visit at the agreed upon time to discuss these results.  Objective:   Blood pressure 124/77, pulse 73, temperature 98 F (36.7 C), temperature source Oral, height 4\' 11"   (1.499 m), weight 197 lb (89.4 kg), SpO2 99 %. Body mass index is 39.79 kg/m.  Uses a cane for ambulation. General: Cooperative, alert, well developed, in no acute distress. HEENT: Conjunctivae and lids unremarkable. Cardiovascular: Regular rhythm.  Lungs: Normal work of breathing. Neurologic: No focal deficits.   Lab Results  Component Value Date   CREATININE 0.77 03/10/2020   BUN 19 03/10/2020   NA 140 03/10/2020   K 3.3 (L) 03/10/2020   CL 105 03/10/2020   CO2 26 03/10/2020   Lab Results  Component Value Date   ALT 13 03/10/2020   AST 18 03/10/2020   ALKPHOS 85 03/10/2020   BILITOT 0.5 03/10/2020   Lab Results  Component Value Date   HGBA1C 5.6 04/01/2020   HGBA1C 5.7 (H) 09/17/2019   HGBA1C 5.9 (H) 06/04/2019   HGBA1C 5.9 03/01/2018   HGBA1C 5.8 05/08/2017   Lab Results  Component Value Date   INSULIN 12.2 04/01/2020   INSULIN 17.7 09/17/2019   INSULIN 10.7 06/04/2019   Lab Results  Component Value Date   TSH 1.620 06/04/2019   Lab Results  Component Value Date   CHOL 314 (H) 04/01/2020   HDL 76 04/01/2020   LDLCALC 220 (H) 04/01/2020   TRIG 106 04/01/2020   CHOLHDL 3 03/01/2018   Lab Results  Component Value Date   WBC 7.3 03/10/2020   HGB 13.1 03/10/2020   HCT 42.8 03/10/2020   MCV 89.5 03/10/2020   PLT 345 03/10/2020   No results found for: IRON, TIBC, FERRITIN  Obesity Behavioral Intervention:   Approximately 15 minutes were spent on the discussion below.  ASK: We discussed the diagnosis of obesity with 03/12/2020 today and Olena agreed to give [name] permission to discuss obesity behavioral modification therapy today.  ASSESS: Julyana has the diagnosis of obesity and her BMI today is 39.77. Chrishawn is in the action stage of change.   ADVISE: Raksha was educated on the multiple health risks of obesity as well as the benefit of weight loss to improve her health. She was advised of the need for long term treatment and the importance of  lifestyle modifications to improve her current health and to decrease her risk of future health problems.  AGREE: Multiple dietary modification options and treatment options were discussed and Novalynn agreed to follow the recommendations documented in the above note.  ARRANGE: Lavoris was educated on the importance of frequent visits to treat obesity as outlined per CMS and USPSTF guidelines and agreed to schedule her next follow up appointment today.  Attestation Statements:   Reviewed by clinician on day of visit: allergies, medications, problem list, medical history, surgical history, family history, social history, and previous encounter notes.   [name], am acting as [name] for Energy manager, FNP-C.  I have reviewed the above documentation for accuracy and completeness, and I agree with the above. - [place], FNP

## 2020-07-06 ENCOUNTER — Other Ambulatory Visit (INDEPENDENT_AMBULATORY_CARE_PROVIDER_SITE_OTHER): Payer: Self-pay | Admitting: Family Medicine

## 2020-07-06 DIAGNOSIS — R7303 Prediabetes: Secondary | ICD-10-CM

## 2020-07-06 NOTE — Progress Notes (Signed)
DUE TO COVID-19 ONLY ONE VISITOR IS ALLOWED TO COME WITH YOU AND STAY IN THE WAITING ROOM ONLY DURING PRE OP AND PROCEDURE DAY OF SURGERY. THE 1 VISITOR  MAY VISIT WITH YOU AFTER SURGERY IN YOUR PRIVATE ROOM DURING VISITING HOURS ONLY!  YOU NEED TO HAVE A COVID 19 TEST ON_ 07/09/20 ______ @_______ , THIS TEST MUST BE DONE BEFORE SURGERY,  COVID TESTING SITE [address] [place] , IT IS ON THE RIGHT GOING OUT [address] APPROXIMATELY  2 MINUTES PAST [employer] ON THE RIGHT. ONCE YOUR COVID TEST IS COMPLETED,  PLEASE BEGIN THE QUARANTINE INSTRUCTIONS AS OUTLINED IN YOUR HANDOUT.                Azani Brogdon Leeman  07/06/2020   Your procedure is scheduled on: 07/13/2020    Report to [facility] Main  Entrance   Report to admitting at    0700 AM     Call this number if you have problems the morning of surgery [phone]    REMEMBER: NO  SOLID FOOD CANDY OR GUM AFTER MIDNIGHT. CLEAR LIQUIDS UNTIL   0630AM        . NOTHING BY MOUTH EXCEPT CLEAR LIQUIDS UNTIL    . PLEASE FINISH g2 DRINK PER SURGEON ORDER  WHICH NEEDS TO BE COMPLETED AT  09-05-1998     .      CLEAR LIQUID DIET   Foods Allowed                                                                    Coffee and tea, regular and decaf                            Fruit ices (not with fruit pulp)                                      Iced Popsicles                                    Carbonated beverages, regular and diet                                    Cranberry, grape and apple juices Sports drinks like Gatorade Lightly seasoned clear broth or consume(fat free) Sugar, honey syrup ___________________________________________________________________      BRUSH YOUR TEETH MORNING OF SURGERY AND RINSE YOUR MOUTH OUT, NO CHEWING GUM CANDY OR MINTS.     Take these medicines the morning of surgery with A SIP OF WATER:                      NONE                                 You may not  have any metal on your body including hair pins and  piercings  Do not wear jewelry, make-up, lotions, powders or perfumes, deodorant             Do not wear nail polish on your fingernails.  Do not shave  48 hours prior to surgery.              Men may shave face and neck.   Do not bring valuables to the hospital. [place].  Contacts, dentures or bridgework may not be worn into surgery.  Leave suitcase in the car. After surgery it may be brought to your room.     Patients discharged the day of surgery will not be allowed to drive home. IF YOU ARE HAVING SURGERY AND GOING HOME THE SAME DAY, YOU MUST HAVE AN ADULT TO DRIVE YOU HOME AND BE WITH YOU FOR 24 HOURS. YOU MAY GO HOME BY TAXI OR UBER OR ORTHERWISE, BUT AN ADULT MUST ACCOMPANY YOU HOME AND STAY WITH YOU FOR 24 HOURS.  Name and phone number of your driver:  Special Instructions: N/A              Please read over the following fact sheets you were given: _____________________________________________________________________  [facility] - Preparing for Surgery Before surgery, you can play an important role.  Because skin is not sterile, your skin needs to be as free of germs as possible.  You can reduce the number of germs on your skin by washing with CHG (chlorahexidine gluconate) soap before surgery.  CHG is an antiseptic cleaner which kills germs and bonds with the skin to continue killing germs even after washing. Please DO NOT use if you have an allergy to CHG or antibacterial soaps.  If your skin becomes reddened/irritated stop using the CHG and inform your nurse when you arrive at Short Stay. Do not shave (including legs and underarms) for at least 48 hours prior to the first CHG shower.  You may shave your face/neck. Please follow these instructions carefully:  1.  Shower with CHG Soap the night before surgery and the  morning of Surgery.  2.  If you choose to wash your  hair, wash your hair first as usual with your  normal  shampoo.  3.  After you shampoo, rinse your hair and body thoroughly to remove the  shampoo.                           4.  Use CHG as you would any other liquid soap.  You can apply chg directly  to the skin and wash                       Gently with a scrungie or clean washcloth.  5.  Apply the CHG Soap to your body ONLY FROM THE NECK DOWN.   Do not use on face/ open                           Wound or open sores. Avoid contact with eyes, ears mouth and genitals (private parts).                       Wash face,  Genitals (private parts) with your normal soap.             6.  Wash thoroughly, paying special attention to the area where your surgery  will be performed.  7.  Thoroughly rinse your body with warm water from the neck down.  8.  DO NOT shower/wash with your normal soap after using and rinsing off  the CHG Soap.                9.  Pat yourself dry with a clean towel.            10.  Wear clean pajamas.            11.  Place clean sheets on your bed the night of your first shower and do not  sleep with pets. Day of Surgery : Do not apply any lotions/deodorants the morning of surgery.  Please wear clean clothes to the hospital/surgery center.  FAILURE TO FOLLOW THESE INSTRUCTIONS MAY RESULT IN THE CANCELLATION OF YOUR SURGERY PATIENT SIGNATURE_________________________________  NURSE SIGNATURE__________________________________  ________________________________________________________________________

## 2020-07-07 ENCOUNTER — Ambulatory Visit (INDEPENDENT_AMBULATORY_CARE_PROVIDER_SITE_OTHER): Payer: Medicare HMO | Admitting: Family Medicine

## 2020-07-07 ENCOUNTER — Other Ambulatory Visit: Payer: Self-pay

## 2020-07-07 ENCOUNTER — Encounter (INDEPENDENT_AMBULATORY_CARE_PROVIDER_SITE_OTHER): Payer: Self-pay | Admitting: Family Medicine

## 2020-07-07 VITALS — BP 120/80 | HR 83 | Temp 98.5°F | Ht 59.0 in | Wt 193.0 lb

## 2020-07-07 DIAGNOSIS — E559 Vitamin D deficiency, unspecified: Secondary | ICD-10-CM

## 2020-07-07 DIAGNOSIS — E7849 Other hyperlipidemia: Secondary | ICD-10-CM | POA: Diagnosis not present

## 2020-07-07 DIAGNOSIS — Z6838 Body mass index (BMI) 38.0-38.9, adult: Secondary | ICD-10-CM | POA: Diagnosis not present

## 2020-07-07 DIAGNOSIS — R7303 Prediabetes: Secondary | ICD-10-CM

## 2020-07-07 DIAGNOSIS — Z6839 Body mass index (BMI) 39.0-39.9, adult: Secondary | ICD-10-CM

## 2020-07-07 MED ORDER — VITAMIN D (ERGOCALCIFEROL) 1.25 MG (50000 UNIT) PO CAPS: 50000.0000 [IU] | ORAL_CAPSULE | ORAL | 0 refills | Status: DC

## 2020-07-07 NOTE — Progress Notes (Signed)
The 10-year ASCVD risk score Denman George DC Montez Hageman., et al., 2013) is: 9.6%   Values used to calculate the score:     Age: 69 years     Sex: Female     Is Non-Hispanic African American: Yes     Diabetic: No     Tobacco smoker: No     Systolic Blood Pressure: 120 mmHg     Is BP treated: Yes     HDL Cholesterol: 73 mg/dL     Total Cholesterol: 225 mg/dL

## 2020-07-08 ENCOUNTER — Encounter (HOSPITAL_COMMUNITY): Payer: Self-pay

## 2020-07-08 ENCOUNTER — Encounter (HOSPITAL_COMMUNITY)
Admission: RE | Admit: 2020-07-08 | Discharge: 2020-07-08 | Disposition: A | Discharge status: Home / Self Care | Payer: Medicare HMO | Source: Ambulatory Visit | Attending: Orthopedic Surgery | Admitting: Orthopedic Surgery

## 2020-07-08 DIAGNOSIS — Z01812 Encounter for preprocedural laboratory examination: Secondary | ICD-10-CM | POA: Diagnosis present

## 2020-07-08 LAB — CBC
HCT: 43.3 % (ref 36.0–46.0)
Hemoglobin: 13.7 g/dL (ref 12.0–15.0)
MCH: 27.5 pg (ref 26.0–34.0)
MCHC: 31.6 g/dL (ref 30.0–36.0)
MCV: 86.9 fL (ref 80.0–100.0)
Platelets: 353 10*3/uL (ref 150–400)
RBC: 4.98 MIL/uL (ref 3.87–5.11)
RDW: 13.3 % (ref 11.5–15.5)
WBC: 8 10*3/uL (ref 4.0–10.5)
nRBC: 0 % (ref 0.0–0.2)

## 2020-07-08 LAB — PROTIME-INR
INR: 1 (ref 0.8–1.2)
Prothrombin Time: 12.7 seconds (ref 11.4–15.2)

## 2020-07-08 LAB — SURGICAL PCR SCREEN
MRSA, PCR: NEGATIVE
Staphylococcus aureus: NEGATIVE

## 2020-07-08 LAB — COMPREHENSIVE METABOLIC PANEL
ALT: 14 U/L (ref 0–44)
AST: 18 U/L (ref 15–41)
Albumin: 4.4 g/dL (ref 3.5–5.0)
Alkaline Phosphatase: 78 U/L (ref 38–126)
Anion gap: 10 (ref 5–15)
BUN: 16 mg/dL (ref 8–23)
CO2: 26 mmol/L (ref 22–32)
Calcium: 9.7 mg/dL (ref 8.9–10.3)
Chloride: 102 mmol/L (ref 98–111)
Creatinine, Ser: 0.75 mg/dL (ref 0.44–1.00)
GFR, Estimated: >60 mL/min (ref >60)
Glucose, Bld: 91 mg/dL (ref 70–99)
Potassium: 3.7 mmol/L (ref 3.5–5.1)
Sodium: 138 mmol/L (ref 135–145)
Total Bilirubin: 0.5 mg/dL (ref 0.3–1.2)
Total Protein: 7.9 g/dL (ref 6.5–8.1)

## 2020-07-08 LAB — APTT: aPTT: 26 seconds (ref 24–36)

## 2020-07-08 NOTE — Progress Notes (Signed)
PCP [facility] Primary Dr Margot Ables clearance per chart 04/22/2020  CT Chest (epic) 12/19/2019 EKG (epic) 12/19/2019  Pt verbalized understanding of instructions that were given to them at the PAT appointment. Patient was also instructed that they will need to review over the PAT instructions again at home before surgery.

## 2020-07-08 NOTE — Patient Instructions (Signed)
DUE TO COVID-19 ONLY ONE VISITOR IS ALLOWED TO COME WITH YOU AND STAY IN THE WAITING ROOM ONLY DURING PRE OP AND PROCEDURE DAY OF SURGERY. THE 1 VISITOR  MAY VISIT WITH YOU AFTER SURGERY IN YOUR PRIVATE ROOM DURING VISITING HOURS ONLY!  YOU NEED TO HAVE A COVID 19 TEST ON Thursday July 09, 2020 @  12:15 pm, THIS TEST MUST BE DONE BEFORE SURGERY,  COVID TESTING SITE [address] [place] [phone], IT IS ON THE RIGHT GOING OUT [address] APPROXIMATELY  2 MINUTES PAST [employer] ON THE RIGHT. ONCE YOUR COVID TEST IS COMPLETED,  PLEASE BEGIN THE QUARANTINE INSTRUCTIONS AS OUTLINED IN YOUR HANDOUT.                Darlene [name]  07/08/2020   Your procedure is scheduled on: Monday July 13, 2020   [facility] Main  Entrance   Report to admitting at 0700 AM     Call this number if you have problems the morning of surgery [phone]    Remember: Do not eat food after midnight. Can have clear liquids from Midnight till 0630 morning of surgery consuming entire presurgery drink by 0630 then nothing by mouth.   BRUSH YOUR TEETH MORNING OF SURGERY AND RINSE YOUR MOUTH OUT, NO CHEWING GUM CANDY OR MINTS.      Take these medicines the morning of surgery with A SIP OF WATER: NONE                                You may not have any metal on your body including hair pins and              piercings  Do not wear jewelry, make-up, lotions, powders or perfumes, deodorant             Do not wear nail polish on your fingernails.  Do not shave  48 hours prior to surgery.               Do not bring valuables to the hospital. [place] IS NOT             RESPONSIBLE   FOR VALUABLES.  Contacts, dentures or bridgework may not be worn into surgery.  Leave suitcase in the car. After surgery it may be brought to your room.     Patients discharged the day of surgery will not be allowed to drive home. IF YOU ARE HAVING SURGERY AND GOING HOME THE  SAME DAY, YOU MUST HAVE AN ADULT TO DRIVE YOU HOME AND BE WITH YOU FOR 24 HOURS. YOU MAY GO HOME BY TAXI OR UBER OR ORTHERWISE, BUT AN ADULT MUST ACCOMPANY YOU HOME AND STAY WITH YOU FOR 24 HOURS.  [place] - Preparing for Surgery Before surgery, you can play an important role.  Because skin is not sterile, your skin needs to be as free of germs as possible.  You can reduce the number of germs on your skin by washing with CHG (chlorahexidine gluconate) soap before surgery.  CHG is an antiseptic cleaner which kills germs and bonds with the skin to continue killing germs even after washing. Please DO NOT use if you have an allergy to CHG or antibacterial soaps.  If your skin becomes reddened/irritated stop using the CHG and inform your nurse when you arrive at Short Stay. Do not shave (including  legs and underarms) for at least 48 hours prior to the first CHG shower.  You may shave your face/neck. Please follow these instructions carefully:  1.  Shower with CHG Soap the night before surgery and the  morning of Surgery.  2.  If you choose to wash your hair, wash your hair first as usual with your  normal  shampoo.  3.  After you shampoo, rinse your hair and body thoroughly to remove the  shampoo.                           4.  Use CHG as you would any other liquid soap.  You can apply chg directly  to the skin and wash                       Gently with a scrungie or clean washcloth.  5.  Apply the CHG Soap to your body ONLY FROM THE NECK DOWN.   Do not use on face/ open                           Wound or open sores. Avoid contact with eyes, ears mouth and genitals (private parts).                       Wash face,  Genitals (private parts) with your normal soap.             6.  Wash thoroughly, paying special attention to the area where your surgery  will be performed.  7.  Thoroughly rinse your body with warm water from the neck down.  8.  DO NOT shower/wash with your normal soap after using and  rinsing off  the CHG Soap.                9.  Pat yourself dry with a clean towel.            10.  Wear clean pajamas.            11.  Place clean sheets on your bed the night of your first shower and do not  sleep with pets. Day of Surgery : Do not apply any lotions/deodorants the morning of surgery.  Please wear clean clothes to the hospital/surgery center.  FAILURE TO FOLLOW THESE INSTRUCTIONS MAY RESULT IN THE CANCELLATION OF YOUR SURGERY PATIENT SIGNATURE_________________________________  NURSE SIGNATURE__________________________________  ________________________________________________________________________ _____________________________________________________________________                CLEAR LIQUID DIET   Foods Allowed                                                                     Foods Excluded  Coffee and tea, regular and decaf                             liquids that you cannot  Plain Jell-O any favor except red or purple  see through such as: Fruit ices (not with fruit pulp)                                     milk, soups, orange juice  Iced Popsicles                                    All solid food Carbonated beverages, regular and diet                                    Cranberry, grape and apple juices Sports drinks like Gatorade Lightly seasoned clear broth or consume(fat free) Sugar, honey syrup  Sample Menu Breakfast                                Lunch                                     Supper Cranberry juice                    Beef broth                            Chicken broth Jell-O                                     Grape juice                           Apple juice Coffee or tea                        Jell-O                                      Popsicle                                                Coffee or tea                        Coffee or  tea  _____________________________________________________________________    Incentive Spirometer  An incentive spirometer is a tool that can help keep your lungs clear and active. This tool measures how well you are filling your lungs with each breath. Taking long deep breaths may help reverse or decrease the chance of developing breathing (pulmonary) problems (especially infection) following:  A long period of time when you are unable to move or be active. BEFORE THE PROCEDURE   If the spirometer includes an indicator to show your best effort, your nurse or respiratory therapist will set it to a desired goal.  If possible, sit up straight or lean slightly forward. Try not to slouch.  Hold the incentive  spirometer in an upright position. INSTRUCTIONS FOR USE  1. Sit on the edge of your bed if possible, or sit up as far as you can in bed or on a chair. 2. Hold the incentive spirometer in an upright position. 3. Breathe out normally. 4. Place the mouthpiece in your mouth and seal your lips tightly around it. 5. Breathe in slowly and as deeply as possible, raising the piston or the ball toward the top of the column. 6. Hold your breath for 3-5 seconds or for as long as possible. Allow the piston or ball to fall to the bottom of the column. 7. Remove the mouthpiece from your mouth and breathe out normally. 8. Rest for a few seconds and repeat Steps 1 through 7 at least 10 times every 1-2 hours when you are awake. Take your time and take a few normal breaths between deep breaths. 9. The spirometer may include an indicator to show your best effort. Use the indicator as a goal to work toward during each repetition. 10. After each set of 10 deep breaths, practice coughing to be sure your lungs are clear. If you have an incision (the cut made at the time of surgery), support your incision when coughing by placing a pillow or rolled up towels firmly against it. Once you are able to get out of  bed, walk around indoors and cough well. You may stop using the incentive spirometer when instructed by your caregiver.  RISKS AND COMPLICATIONS  Take your time so you do not get dizzy or light-headed.  If you are in pain, you may need to take or ask for pain medication before doing incentive spirometry. It is harder to take a deep breath if you are having pain. AFTER USE  Rest and breathe slowly and easily.  It can be helpful to keep track of a log of your progress. Your caregiver can provide you with a simple table to help with this. If you are using the spirometer at home, follow these instructions: SEEK MEDICAL CARE IF:   You are having difficultly using the spirometer.  You have trouble using the spirometer as often as instructed.  Your pain medication is not giving enough relief while using the spirometer.  You develop fever of 100.5 F (38.1 C) or higher. SEEK IMMEDIATE MEDICAL CARE IF:   You cough up bloody sputum that had not been present before.  You develop fever of 102 F (38.9 C) or greater.  You develop worsening pain at or near the incision site. MAKE SURE YOU:   Understand these instructions.  Will watch your condition.  Will get help right away if you are not doing well or get worse. Document Released: 01/23/2007 Document Revised: 12/05/2011 Document Reviewed: 03/26/2007 [employer] Patient Information 2014 [employer], [place].   ________________________________________________________________________  WHAT IS A BLOOD TRANSFUSION? Blood Transfusion Information  A transfusion is the replacement of blood or some of its parts. Blood is made up of multiple cells which provide different functions.  Red blood cells carry oxygen and are used for blood loss replacement.  White blood cells fight against infection.  Platelets control bleeding.  Plasma helps clot blood.  Other blood products are available for specialized needs, such as hemophilia or other  clotting disorders. BEFORE THE TRANSFUSION  Who gives blood for transfusions?   Healthy volunteers who are fully evaluated to make sure their blood is safe. This is blood bank blood. Transfusion therapy is the safest it has ever been in the  practice of medicine. Before blood is taken from a donor, a complete history is taken to make sure that person has no history of diseases nor engages in risky social behavior (examples are intravenous drug use or sexual activity with multiple partners). The donor's travel history is screened to minimize risk of transmitting infections, such as malaria. The donated blood is tested for signs of infectious diseases, such as HIV and hepatitis. The blood is then tested to be sure it is compatible with you in order to minimize the chance of a transfusion reaction. If you or a relative donates blood, this is often done in anticipation of surgery and is not appropriate for emergency situations. It takes many days to process the donated blood. RISKS AND COMPLICATIONS Although transfusion therapy is very safe and saves many lives, the main dangers of transfusion include:   Getting an infectious disease.  Developing a transfusion reaction. This is an allergic reaction to something in the blood you were given. Every precaution is taken to prevent this. The decision to have a blood transfusion has been considered carefully by your caregiver before blood is given. Blood is not given unless the benefits outweigh the risks. AFTER THE TRANSFUSION  Right after receiving a blood transfusion, you will usually feel much better and more energetic. This is especially true if your red blood cells have gotten low (anemic). The transfusion raises the level of the red blood cells which carry oxygen, and this usually causes an energy increase.  The nurse administering the transfusion will monitor you carefully for complications. HOME CARE INSTRUCTIONS  No special instructions are needed  after a transfusion. You may find your energy is better. Speak with your caregiver about any limitations on activity for underlying diseases you may have. SEEK MEDICAL CARE IF:   Your condition is not improving after your transfusion.  You develop redness or irritation at the intravenous (IV) site. SEEK IMMEDIATE MEDICAL CARE IF:  Any of the following symptoms occur over the next 12 hours:  Shaking chills.  You have a temperature by mouth above 102 F (38.9 C), not controlled by medicine.  Chest, back, or muscle pain.  People around you feel you are not acting correctly or are confused.  Shortness of breath or difficulty breathing.  Dizziness and fainting.  You get a rash or develop hives.  You have a decrease in urine output.  Your urine turns a dark color or changes to pink, red, or brown. Any of the following symptoms occur over the next 10 days:  You have a temperature by mouth above 102 F (38.9 C), not controlled by medicine.  Shortness of breath.  Weakness after normal activity.  The white part of the eye turns yellow (jaundice).  You have a decrease in the amount of urine or are urinating less often.  Your urine turns a dark color or changes to pink, red, or brown. Document Released: 09/09/2000 Document Revised: 12/05/2011 Document Reviewed: 04/28/2008 [facility] Patient Information 2014 [place], [place].  _______________________________________________________________________

## 2020-07-08 NOTE — Progress Notes (Signed)
Pt stated during PAT visit that the surgery is to be done on her left knee not the right. Per the surgery scheduled the left is noted but per surgical consent the right is noted. Can you review and correct. Thanks

## 2020-07-09 ENCOUNTER — Other Ambulatory Visit (HOSPITAL_COMMUNITY)
Admission: RE | Admit: 2020-07-09 | Discharge: 2020-07-09 | Disposition: A | Discharge status: Home / Self Care | Payer: Medicare HMO | Source: Ambulatory Visit | Attending: Orthopedic Surgery | Admitting: Orthopedic Surgery

## 2020-07-09 DIAGNOSIS — Z01812 Encounter for preprocedural laboratory examination: Secondary | ICD-10-CM | POA: Insufficient documentation

## 2020-07-09 DIAGNOSIS — Z20822 Contact with and (suspected) exposure to covid-19: Secondary | ICD-10-CM | POA: Diagnosis not present

## 2020-07-09 LAB — SARS CORONAVIRUS 2 (TAT 6-24 HRS): SARS Coronavirus 2: NEGATIVE

## 2020-07-09 NOTE — Progress Notes (Signed)
Chief Complaint:   OBESITY Darlene [name] is here to discuss her progress with her obesity treatment plan along with follow-up of her obesity related diagnoses. Darlene [name] is keeping a food journal and adhering to recommended goals of 1150 calories and 80 grams of protein and states she is following her eating plan approximately 100% of the time. Darlene [name] states she is doing Banker 30 minutes 3 times per week.  Today's visit was #: 23 Starting weight: 213 lbs Starting date: 06/04/2019 Today's weight: 193 lbs Today's date: 07/07/2020 Total lbs lost to date: 20 Total lbs lost since last in-office visit: 4  Interim History: Darlene [name] is excited with her weight loss today. She was very diligent with keeping track of calories and reports meeting her calorie and protein goals.. She feels Ozempic helped with appetite. She will have left total knee replacement next Monday.  Subjective:   Prediabetes.   Darlene [name] likes the [employer] and feels it did suppress her appetite. She does note mild constipation, which is relieved with extra fiber. A1c is back up to 5.9 from 5.6.  Lab Results  Component Value Date   HGBA1C 5.9 (H) 06/23/2020   Lab Results  Component Value Date   INSULIN 10.5 06/23/2020   INSULIN 12.2 04/01/2020   INSULIN 17.7 09/17/2019   INSULIN 10.7 06/04/2019   Vitamin D deficiency. Vitamin D level has decreased from 45 to 44. Darlene [name] is on prescription Vitamin D once weekly.   Ref. Range 06/23/2020 09:54  Vitamin D, 25-Hydroxy Latest Ref Range: 30.0 - 100.0 ng/mL 44.5   Other hyperlipidemia. ASCVD risk score if 9.6%. Darlene [name] is not on a statin. LDL is elevated at 131.   Lab Results  Component Value Date   CHOL 225 (H) 06/23/2020   HDL 73 06/23/2020   LDLCALC 131 (H) 06/23/2020   TRIG 123 06/23/2020   CHOLHDL 3 03/01/2018   Lab Results  Component Value Date   ALT 14 07/08/2020   AST 18 07/08/2020   ALKPHOS 78 07/08/2020    BILITOT 0.5 07/08/2020   The 10-year ASCVD risk score Denman George DC Jr., et al., 2013) is: 14.7%   Values used to calculate the score:     Age: 69 years     Sex: Female     Is Non-Hispanic African American: Yes     Diabetic: No     Tobacco smoker: No     Systolic Blood Pressure: 147 mmHg     Is BP treated: Yes     HDL Cholesterol: 73 mg/dL     Total Cholesterol: 225 mg/dL  Assessment/Plan:   Prediabetes. Darlene [name] will continue to work on weight loss, exercise, and decreasing simple carbohydrates to help decrease the risk of diabetes. She will continue Ozempic 0.25 weekly as directed.   Vitamin D deficiency.  She was given a refill on her Vitamin D, Ergocalciferol, (DRISDOL) 1.25 MG (50000 UNIT) CAPS capsule every week #4 with 0 refills and she will add OTC Vitamin D3 2000 after knee surgery. She will follow-up for routine testing of Vitamin D, at least 2-3 times per year to avoid over-replacement.   Other hyperlipidemia. I suggested a statin to the patient today and we will address again after surgery.  Class 2 severe obesity with serious comorbidity and body mass index (BMI) of 39.0 to 39.9 in adult, unspecified obesity type (HCC).  Darlene [name] is currently in the action stage of change. As such, her goal is to continue with weight loss efforts.  She has agreed to keeping a food journal and adhering to recommended goals of [phone] calories and 80 grams of protein daily.   Exercise goals: No exercise has been prescribed at this time.  Behavioral modification strategies: keeping a strict food journal.  Darlene [name] has agreed to follow-up with our clinic in 5 weeks after her total knee replacement.  Objective:   Blood pressure 120/80, pulse 83, temperature 98.5 F (36.9 C), height 4\' 11"  (1.499 m), weight 193 lb (87.5 kg), SpO2 99 %. Body mass index is 38.98 kg/m.  General: Cooperative, alert, well developed, in no acute distress. HEENT: Conjunctivae and lids unremarkable. Cardiovascular:  Regular rhythm.  Lungs: Normal work of breathing. Neurologic: No focal deficits.   Lab Results  Component Value Date   CREATININE 0.75 07/08/2020   BUN 16 07/08/2020   NA 138 07/08/2020   K 3.7 07/08/2020   CL 102 07/08/2020   CO2 26 07/08/2020   Lab Results  Component Value Date   ALT 14 07/08/2020   AST 18 07/08/2020   ALKPHOS 78 07/08/2020   BILITOT 0.5 07/08/2020   Lab Results  Component Value Date   HGBA1C 5.9 (H) 06/23/2020   HGBA1C 5.6 04/01/2020   HGBA1C 5.7 (H) 09/17/2019   HGBA1C 5.9 (H) 06/04/2019   HGBA1C 5.9 03/01/2018   Lab Results  Component Value Date   INSULIN 10.5 06/23/2020   INSULIN 12.2 04/01/2020   INSULIN 17.7 09/17/2019   INSULIN 10.7 06/04/2019   Lab Results  Component Value Date   TSH 1.620 06/04/2019   Lab Results  Component Value Date   CHOL 225 (H) 06/23/2020   HDL 73 06/23/2020   LDLCALC 131 (H) 06/23/2020   TRIG 123 06/23/2020   CHOLHDL 3 03/01/2018   Lab Results  Component Value Date   WBC 8.0 07/08/2020   HGB 13.7 07/08/2020   HCT 43.3 07/08/2020   MCV 86.9 07/08/2020   PLT 353 07/08/2020   No results found for: IRON, TIBC, FERRITIN  Obesity Behavioral Intervention:   Approximately 15 minutes were spent on the discussion below.  ASK: We discussed the diagnosis of obesity with 07/10/2020 today and Darlene [name] agreed to give Darlene [name] permission to discuss obesity behavioral modification therapy today.  ASSESS: Darlene [name] has the diagnosis of obesity and her BMI today is 39.0. Darlene [name] is in the action stage of change.   ADVISE: Darlene [name] was educated on the multiple health risks of obesity as well as the benefit of weight loss to improve her health. She was advised of the need for long term treatment and the importance of lifestyle modifications to improve her current health and to decrease her risk of future health problems.  AGREE: Multiple dietary modification options and treatment options were discussed and Darlene [name] agreed to  follow the recommendations documented in the above note.  ARRANGE: Darlene [name] was educated on the importance of frequent visits to treat obesity as outlined per CMS and USPSTF guidelines and agreed to schedule her next follow up appointment today.  Attestation Statements:   Reviewed by clinician on day of visit: allergies, medications, problem list, medical history, surgical history, family history, social history, and previous encounter notes.  IBritta [name], am acting as [name] for [employer] manager, FNP-C   I have reviewed the above documentation for accuracy and completeness, and I agree with the above. -  [place], FNP

## 2020-07-10 ENCOUNTER — Encounter (HOSPITAL_COMMUNITY): Payer: Self-pay | Admitting: Orthopedic Surgery

## 2020-07-12 MED ORDER — BUPIVACAINE LIPOSOME 1.3 % IJ SUSP
20.0000 mL | Freq: Once | INTRAMUSCULAR | Status: DC
  Filled 2020-07-12: qty 20
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
## 2020-07-13 ENCOUNTER — Other Ambulatory Visit: Payer: Self-pay

## 2020-07-13 ENCOUNTER — Ambulatory Visit (HOSPITAL_COMMUNITY): Payer: Medicare HMO | Admitting: Physician Assistant

## 2020-07-13 ENCOUNTER — Encounter (HOSPITAL_COMMUNITY): Payer: Self-pay | Admitting: Orthopedic Surgery

## 2020-07-13 ENCOUNTER — Ambulatory Visit (HOSPITAL_COMMUNITY): Payer: Medicare HMO | Admitting: Anesthesiology

## 2020-07-13 ENCOUNTER — Encounter (HOSPITAL_COMMUNITY)
Admission: RE | Disposition: A | Discharge status: Home / Self Care | Payer: Self-pay | Source: Home / Self Care | Attending: Orthopedic Surgery

## 2020-07-13 ENCOUNTER — Ambulatory Visit (HOSPITAL_COMMUNITY)
Admission: RE | Admit: 2020-07-13 | Discharge: 2020-07-17 | Disposition: A | Discharge status: Home / Self Care | Payer: Medicare HMO | Attending: Orthopedic Surgery | Admitting: Orthopedic Surgery

## 2020-07-13 DIAGNOSIS — M1712 Unilateral primary osteoarthritis, left knee: Secondary | ICD-10-CM | POA: Diagnosis present

## 2020-07-13 DIAGNOSIS — E559 Vitamin D deficiency, unspecified: Secondary | ICD-10-CM | POA: Insufficient documentation

## 2020-07-13 DIAGNOSIS — M179 Osteoarthritis of knee, unspecified: Secondary | ICD-10-CM | POA: Diagnosis present

## 2020-07-13 DIAGNOSIS — K5900 Constipation, unspecified: Secondary | ICD-10-CM | POA: Diagnosis not present

## 2020-07-13 DIAGNOSIS — M1710 Unilateral primary osteoarthritis, unspecified knee: Secondary | ICD-10-CM | POA: Diagnosis present

## 2020-07-13 DIAGNOSIS — R7303 Prediabetes: Secondary | ICD-10-CM | POA: Insufficient documentation

## 2020-07-13 DIAGNOSIS — Z79899 Other long term (current) drug therapy: Secondary | ICD-10-CM | POA: Diagnosis not present

## 2020-07-13 DIAGNOSIS — I10 Essential (primary) hypertension: Secondary | ICD-10-CM | POA: Diagnosis not present

## 2020-07-13 DIAGNOSIS — F419 Anxiety disorder, unspecified: Secondary | ICD-10-CM | POA: Diagnosis not present

## 2020-07-13 DIAGNOSIS — Z7984 Long term (current) use of oral hypoglycemic drugs: Secondary | ICD-10-CM | POA: Insufficient documentation

## 2020-07-13 DIAGNOSIS — Z6838 Body mass index (BMI) 38.0-38.9, adult: Secondary | ICD-10-CM | POA: Insufficient documentation

## 2020-07-13 HISTORY — PX: TOTAL KNEE ARTHROPLASTY: SHX125

## 2020-07-13 LAB — GLUCOSE, CAPILLARY
Glucose-Capillary: 185 mg/dL — ABNORMAL HIGH (ref 70–99)
Glucose-Capillary: 144 mg/dL — ABNORMAL HIGH (ref 70–99)

## 2020-07-13 LAB — TYPE AND SCREEN
ABO/RH(D): O POS
Antibody Screen: NEGATIVE

## 2020-07-13 LAB — ABO/RH: ABO/RH(D): O POS

## 2020-07-13 SURGERY — ARTHROPLASTY, KNEE, TOTAL
Anesthesia: Spinal | Site: Knee | Laterality: Left

## 2020-07-13 MED ORDER — PHENOL 1.4 % MT LIQD: 1.0000 | OROMUCOSAL | Status: DC | PRN

## 2020-07-13 MED ORDER — CEFAZOLIN SODIUM-DEXTROSE 2-4 GM/100ML-% IV SOLN
2.0000 g | INTRAVENOUS | Status: AC
  Administered 2020-07-13: 2 g via INTRAVENOUS
  Filled 2020-07-13: qty 100

## 2020-07-13 MED ORDER — FENTANYL CITRATE (PF) 100 MCG/2ML IJ SOLN
50.0000 ug | INTRAMUSCULAR | Status: DC
  Administered 2020-07-13: 50 ug via INTRAVENOUS
  Filled 2020-07-13: qty 2

## 2020-07-13 MED ORDER — EPHEDRINE 5 MG/ML INJ
INTRAVENOUS | Status: AC
  Filled 2020-07-13: qty 20

## 2020-07-13 MED ORDER — DEXAMETHASONE SODIUM PHOSPHATE 10 MG/ML IJ SOLN: 8.0000 mg | Freq: Once | INTRAMUSCULAR | Status: DC

## 2020-07-13 MED ORDER — FLEET ENEMA 7-19 GM/118ML RE ENEM: 1.0000 | ENEMA | Freq: Once | RECTAL | Status: DC | PRN

## 2020-07-13 MED ORDER — DIPHENHYDRAMINE HCL 50 MG/ML IJ SOLN
INTRAMUSCULAR | Status: AC
  Filled 2020-07-13: qty 1

## 2020-07-13 MED ORDER — ONDANSETRON HCL 4 MG/2ML IJ SOLN
4.0000 mg | Freq: Four times a day (QID) | INTRAMUSCULAR | Status: DC | PRN
  Filled 2020-07-13: qty 2

## 2020-07-13 MED ORDER — CHLORHEXIDINE GLUCONATE 0.12 % MT SOLN
15.0000 mL | Freq: Once | OROMUCOSAL | Status: AC
  Administered 2020-07-13: 15 mL via OROMUCOSAL

## 2020-07-13 MED ORDER — BUPIVACAINE IN DEXTROSE 0.75-8.25 % IT SOLN
INTRATHECAL | Status: DC | PRN
  Administered 2020-07-13: 1.6 mL via INTRATHECAL

## 2020-07-13 MED ORDER — GLYCOPYRROLATE 0.2 MG/ML IJ SOLN
INTRAMUSCULAR | Status: DC | PRN
  Administered 2020-07-13: .2 mg via INTRAVENOUS

## 2020-07-13 MED ORDER — TRANEXAMIC ACID-NACL 1000-0.7 MG/100ML-% IV SOLN
1000.0000 mg | INTRAVENOUS | Status: AC
  Administered 2020-07-13: 1000 mg via INTRAVENOUS
  Filled 2020-07-13: qty 100

## 2020-07-13 MED ORDER — 0.9 % SODIUM CHLORIDE (POUR BTL) OPTIME
TOPICAL | Status: DC | PRN
  Administered 2020-07-13: 1000 mL

## 2020-07-13 MED ORDER — SODIUM CHLORIDE 0.9 % IV SOLN: INTRAVENOUS | Status: DC

## 2020-07-13 MED ORDER — ONDANSETRON HCL 4 MG/2ML IJ SOLN
INTRAMUSCULAR | Status: DC | PRN
  Administered 2020-07-13: 4 mg via INTRAVENOUS

## 2020-07-13 MED ORDER — STERILE WATER FOR IRRIGATION IR SOLN
Status: DC | PRN
  Administered 2020-07-13: 2000 mL

## 2020-07-13 MED ORDER — SODIUM CHLORIDE (PF) 0.9 % IJ SOLN
INTRAMUSCULAR | Status: AC
  Filled 2020-07-13: qty 50

## 2020-07-13 MED ORDER — TRAMADOL HCL 50 MG PO TABS
50.0000 mg | ORAL_TABLET | Freq: Four times a day (QID) | ORAL | Status: DC | PRN
  Administered 2020-07-13: 50 mg via ORAL
  Administered 2020-07-14 – 2020-07-17 (×8): 100 mg via ORAL
  Filled 2020-07-13 (×6): qty 2
  Filled 2020-07-13: qty 1
  Filled 2020-07-13 (×3): qty 2

## 2020-07-13 MED ORDER — ORAL CARE MOUTH RINSE: 15.0000 mL | Freq: Once | OROMUCOSAL | Status: AC

## 2020-07-13 MED ORDER — SODIUM CHLORIDE (PF) 0.9 % IJ SOLN
INTRAMUSCULAR | Status: DC | PRN
  Administered 2020-07-13: 60 mL

## 2020-07-13 MED ORDER — ACETAMINOPHEN 10 MG/ML IV SOLN
1000.0000 mg | Freq: Four times a day (QID) | INTRAVENOUS | Status: DC
  Administered 2020-07-13: 1000 mg via INTRAVENOUS
  Filled 2020-07-13: qty 100

## 2020-07-13 MED ORDER — GABAPENTIN 300 MG PO CAPS
300.0000 mg | ORAL_CAPSULE | Freq: Three times a day (TID) | ORAL | Status: DC
  Administered 2020-07-13 – 2020-07-14 (×4): 300 mg via ORAL
  Filled 2020-07-13 (×8): qty 1

## 2020-07-13 MED ORDER — PROPOFOL 500 MG/50ML IV EMUL
INTRAVENOUS | Status: DC | PRN
  Administered 2020-07-13: 75 ug/kg/min via INTRAVENOUS

## 2020-07-13 MED ORDER — PROPOFOL 10 MG/ML IV BOLUS
INTRAVENOUS | Status: DC | PRN
  Administered 2020-07-13: 20 mg via INTRAVENOUS

## 2020-07-13 MED ORDER — POTASSIUM CHLORIDE CRYS ER 10 MEQ PO TBCR
10.0000 meq | EXTENDED_RELEASE_TABLET | Freq: Every day | ORAL | Status: DC
  Administered 2020-07-14 – 2020-07-17 (×4): 10 meq via ORAL
  Filled 2020-07-13 (×4): qty 1

## 2020-07-13 MED ORDER — CEFAZOLIN SODIUM-DEXTROSE 2-4 GM/100ML-% IV SOLN
2.0000 g | Freq: Four times a day (QID) | INTRAVENOUS | Status: AC
  Administered 2020-07-13 (×2): 2 g via INTRAVENOUS
  Filled 2020-07-13 (×2): qty 100

## 2020-07-13 MED ORDER — MORPHINE SULFATE (PF) 2 MG/ML IV SOLN
0.5000 mg | INTRAVENOUS | Status: DC | PRN
  Administered 2020-07-14: 1 mg via INTRAVENOUS
  Filled 2020-07-13: qty 1

## 2020-07-13 MED ORDER — ASPIRIN EC 325 MG PO TBEC
325.0000 mg | DELAYED_RELEASE_TABLET | Freq: Two times a day (BID) | ORAL | Status: DC
  Administered 2020-07-14 – 2020-07-17 (×7): 325 mg via ORAL
  Filled 2020-07-13 (×7): qty 1

## 2020-07-13 MED ORDER — MENTHOL 3 MG MT LOZG: 1.0000 | LOZENGE | OROMUCOSAL | Status: DC | PRN

## 2020-07-13 MED ORDER — METHOCARBAMOL 500 MG PO TABS
500.0000 mg | ORAL_TABLET | Freq: Four times a day (QID) | ORAL | Status: DC | PRN
  Administered 2020-07-14 – 2020-07-17 (×8): 500 mg via ORAL
  Filled 2020-07-13 (×9): qty 1

## 2020-07-13 MED ORDER — BISACODYL 10 MG RE SUPP: 10.0000 mg | Freq: Every day | RECTAL | Status: DC | PRN

## 2020-07-13 MED ORDER — PROPOFOL 10 MG/ML IV BOLUS
INTRAVENOUS | Status: AC
  Filled 2020-07-13: qty 20

## 2020-07-13 MED ORDER — LIDOCAINE 2% (20 MG/ML) 5 ML SYRINGE
INTRAMUSCULAR | Status: AC
  Filled 2020-07-13: qty 5

## 2020-07-13 MED ORDER — LACTATED RINGERS IV SOLN: INTRAVENOUS | Status: DC

## 2020-07-13 MED ORDER — OXYCODONE HCL 5 MG PO TABS
5.0000 mg | ORAL_TABLET | ORAL | Status: DC | PRN
  Administered 2020-07-13: 10 mg via ORAL
  Administered 2020-07-13: 5 mg via ORAL
  Administered 2020-07-14 (×4): 10 mg via ORAL
  Filled 2020-07-13 (×3): qty 2
  Filled 2020-07-13: qty 1
  Filled 2020-07-13 (×3): qty 2

## 2020-07-13 MED ORDER — MIDAZOLAM HCL 2 MG/2ML IJ SOLN
1.0000 mg | INTRAMUSCULAR | Status: DC
  Administered 2020-07-13: 1 mg via INTRAVENOUS
  Filled 2020-07-13: qty 2

## 2020-07-13 MED ORDER — BUPIVACAINE LIPOSOME 1.3 % IJ SUSP
INTRAMUSCULAR | Status: DC | PRN
  Administered 2020-07-13: 20 mL

## 2020-07-13 MED ORDER — HYDROCHLOROTHIAZIDE 25 MG PO TABS
12.5000 mg | ORAL_TABLET | Freq: Every day | ORAL | Status: DC
  Filled 2020-07-13: qty 1

## 2020-07-13 MED ORDER — METOCLOPRAMIDE HCL 5 MG/ML IJ SOLN: 5.0000 mg | Freq: Three times a day (TID) | INTRAMUSCULAR | Status: DC | PRN

## 2020-07-13 MED ORDER — ONDANSETRON HCL 4 MG PO TABS
4.0000 mg | ORAL_TABLET | Freq: Four times a day (QID) | ORAL | Status: DC | PRN
  Filled 2020-07-13: qty 1

## 2020-07-13 MED ORDER — CLONIDINE HCL (ANALGESIA) 100 MCG/ML EP SOLN
EPIDURAL | Status: DC | PRN
  Administered 2020-07-13: 50 ug

## 2020-07-13 MED ORDER — ONDANSETRON HCL 4 MG/2ML IJ SOLN: 4.0000 mg | Freq: Once | INTRAMUSCULAR | Status: DC | PRN

## 2020-07-13 MED ORDER — SODIUM CHLORIDE (PF) 0.9 % IJ SOLN
INTRAMUSCULAR | Status: AC
  Filled 2020-07-13: qty 10

## 2020-07-13 MED ORDER — POVIDONE-IODINE 10 % EX SWAB
2.0000 "application " | Freq: Once | CUTANEOUS | Status: AC
  Administered 2020-07-13: 2 via TOPICAL

## 2020-07-13 MED ORDER — DEXAMETHASONE SODIUM PHOSPHATE 10 MG/ML IJ SOLN
INTRAMUSCULAR | Status: DC | PRN
  Administered 2020-07-13: 8 mg via INTRAVENOUS

## 2020-07-13 MED ORDER — GLYCOPYRROLATE PF 0.2 MG/ML IJ SOSY
PREFILLED_SYRINGE | INTRAMUSCULAR | Status: AC
  Filled 2020-07-13: qty 1

## 2020-07-13 MED ORDER — PHENYLEPHRINE HCL-NACL 10-0.9 MG/250ML-% IV SOLN
INTRAVENOUS | Status: DC | PRN
  Administered 2020-07-13: 55 ug/min via INTRAVENOUS

## 2020-07-13 MED ORDER — METOCLOPRAMIDE HCL 5 MG PO TABS: 5.0000 mg | ORAL_TABLET | Freq: Three times a day (TID) | ORAL | Status: DC | PRN

## 2020-07-13 MED ORDER — FENTANYL CITRATE (PF) 100 MCG/2ML IJ SOLN: 25.0000 ug | INTRAMUSCULAR | Status: DC | PRN

## 2020-07-13 MED ORDER — METHOCARBAMOL 500 MG IVPB - SIMPLE MED
500.0000 mg | Freq: Four times a day (QID) | INTRAVENOUS | Status: DC | PRN
  Filled 2020-07-13: qty 50

## 2020-07-13 MED ORDER — LIDOCAINE 2% (20 MG/ML) 5 ML SYRINGE
INTRAMUSCULAR | Status: DC | PRN
  Administered 2020-07-13: 30 mg via INTRAVENOUS
  Administered 2020-07-13: 50 mg via INTRAVENOUS

## 2020-07-13 MED ORDER — DEXAMETHASONE SODIUM PHOSPHATE 10 MG/ML IJ SOLN
10.0000 mg | Freq: Once | INTRAMUSCULAR | Status: AC
  Administered 2020-07-14: 10 mg via INTRAVENOUS
  Filled 2020-07-13: qty 1

## 2020-07-13 MED ORDER — ROPIVACAINE HCL 7.5 MG/ML IJ SOLN
INTRAMUSCULAR | Status: DC | PRN
  Administered 2020-07-13: 20 mL via PERINEURAL

## 2020-07-13 MED ORDER — DIPHENHYDRAMINE HCL 12.5 MG/5ML PO ELIX: 12.5000 mg | ORAL_SOLUTION | ORAL | Status: DC | PRN

## 2020-07-13 MED ORDER — POLYETHYLENE GLYCOL 3350 17 G PO PACK
17.0000 g | PACK | Freq: Every day | ORAL | Status: DC | PRN
  Administered 2020-07-15 – 2020-07-16 (×2): 17 g via ORAL
  Filled 2020-07-13 (×2): qty 1

## 2020-07-13 MED ORDER — INSULIN ASPART 100 UNIT/ML ~~LOC~~ SOLN
0.0000 [IU] | Freq: Three times a day (TID) | SUBCUTANEOUS | Status: DC
  Administered 2020-07-13 – 2020-07-14 (×2): 3 [IU] via SUBCUTANEOUS
  Administered 2020-07-14: 2 [IU] via SUBCUTANEOUS

## 2020-07-13 MED ORDER — SODIUM CHLORIDE 0.9 % IR SOLN
Status: DC | PRN
  Administered 2020-07-13: 1000 mL

## 2020-07-13 MED ORDER — DOCUSATE SODIUM 100 MG PO CAPS
100.0000 mg | ORAL_CAPSULE | Freq: Two times a day (BID) | ORAL | Status: DC
  Administered 2020-07-13 – 2020-07-17 (×8): 100 mg via ORAL
  Filled 2020-07-13 (×8): qty 1

## 2020-07-13 MED ORDER — PROPOFOL 1000 MG/100ML IV EMUL
INTRAVENOUS | Status: AC
  Filled 2020-07-13: qty 100

## 2020-07-13 MED ORDER — ACETAMINOPHEN 325 MG PO TABS: 325.0000 mg | ORAL_TABLET | Freq: Four times a day (QID) | ORAL | Status: DC | PRN

## 2020-07-13 SURGICAL SUPPLY — 56 items
ATTUNE PS FEM LT SZ 4 CEM KNEE (Femur) ×3 IMPLANT
ATTUNE PSRP INSR SZ4 10 KNEE (Insert) ×2 IMPLANT
ATTUNE PSRP INSR SZ4 10MM KNEE (Insert) ×1 IMPLANT
BAG ZIPLOCK 12X15 (MISCELLANEOUS) ×3 IMPLANT
BASEPLATE TIBIAL ROTATING SZ 4 (Knees) ×3 IMPLANT
BLADE SAG 18X100X1.27 (BLADE) ×3 IMPLANT
BLADE SAW SGTL 11.0X1.19X90.0M (BLADE) ×3 IMPLANT
BLADE SURG SZ10 CARB STEEL (BLADE) ×6 IMPLANT
BNDG ELASTIC 6X5.8 VLCR STR LF (GAUZE/BANDAGES/DRESSINGS) ×3 IMPLANT
BOWL SMART MIX CTS (DISPOSABLE) ×3 IMPLANT
CEMENT HV SMART SET (Cement) ×6 IMPLANT
CLOSURE WOUND 1/2 X4 (GAUZE/BANDAGES/DRESSINGS) ×2
COVER SURGICAL LIGHT HANDLE (MISCELLANEOUS) ×3 IMPLANT
COVER WAND RF STERILE (DRAPES) IMPLANT
CUFF TOURN SGL QUICK 34 (TOURNIQUET CUFF) ×3
CUFF TOURN SGL QUICK 42 (TOURNIQUET CUFF) ×3 IMPLANT
CUFF TRNQT CYL 34X4.125X (TOURNIQUET CUFF) ×1 IMPLANT
DECANTER SPIKE VIAL GLASS SM (MISCELLANEOUS) ×3 IMPLANT
DRAPE U-SHAPE 47X51 STRL (DRAPES) ×3 IMPLANT
DRESSING AQUACEL AG SP 3.5X10 (GAUZE/BANDAGES/DRESSINGS) ×1 IMPLANT
DRSG AQUACEL AG ADV 3.5X10 (GAUZE/BANDAGES/DRESSINGS) ×3 IMPLANT
DRSG AQUACEL AG SP 3.5X10 (GAUZE/BANDAGES/DRESSINGS) ×3
DURAPREP 26ML APPLICATOR (WOUND CARE) ×3 IMPLANT
ELECT REM PT RETURN 15FT ADLT (MISCELLANEOUS) ×3 IMPLANT
GLOVE BIO SURGEON STRL SZ7 (GLOVE) ×3 IMPLANT
GLOVE BIO SURGEON STRL SZ8 (GLOVE) ×3 IMPLANT
GLOVE BIOGEL PI IND STRL 7.0 (GLOVE) ×1 IMPLANT
GLOVE BIOGEL PI IND STRL 8 (GLOVE) ×1 IMPLANT
GLOVE BIOGEL PI INDICATOR 7.0 (GLOVE) ×2
GLOVE BIOGEL PI INDICATOR 8 (GLOVE) ×2
GOWN STRL REUS W/TWL LRG LVL3 (GOWN DISPOSABLE) ×6 IMPLANT
HANDPIECE INTERPULSE COAX TIP (DISPOSABLE) ×3
HOLDER FOLEY CATH W/STRAP (MISCELLANEOUS) ×3 IMPLANT
IMMOBILIZER KNEE 20 (SOFTGOODS) ×3
IMMOBILIZER KNEE 20 THIGH 36 (SOFTGOODS) ×1 IMPLANT
KIT TURNOVER KIT A (KITS) IMPLANT
MANIFOLD NEPTUNE II (INSTRUMENTS) ×3 IMPLANT
NS IRRIG 1000ML POUR BTL (IV SOLUTION) ×3 IMPLANT
PACK TOTAL KNEE CUSTOM (KITS) ×3 IMPLANT
PADDING CAST COTTON 6X4 STRL (CAST SUPPLIES) ×6 IMPLANT
PATELLA MEDIAL ATTUN 35MM KNEE (Knees) ×3 IMPLANT
PENCIL SMOKE EVACUATOR (MISCELLANEOUS) ×3 IMPLANT
PIN DRILL FIX HALF THREAD (BIT) ×3 IMPLANT
PIN STEINMAN FIXATION KNEE (PIN) ×3 IMPLANT
PROTECTOR NERVE ULNAR (MISCELLANEOUS) ×3 IMPLANT
SET HNDPC FAN SPRY TIP SCT (DISPOSABLE) ×1 IMPLANT
STRIP CLOSURE SKIN 1/2X4 (GAUZE/BANDAGES/DRESSINGS) ×4 IMPLANT
SUT MNCRL AB 4-0 PS2 18 (SUTURE) ×3 IMPLANT
SUT STRATAFIX 0 PDS 27 VIOLET (SUTURE) ×3
SUT VIC AB 2-0 CT1 27 (SUTURE) ×12
SUT VIC AB 2-0 CT1 TAPERPNT 27 (SUTURE) ×4 IMPLANT
SUTURE STRATFX 0 PDS 27 VIOLET (SUTURE) ×1 IMPLANT
TRAY FOLEY MTR SLVR 14FR STAT (SET/KITS/TRAYS/PACK) ×3 IMPLANT
WATER STERILE IRR 1000ML POUR (IV SOLUTION) ×6 IMPLANT
WRAP KNEE MAXI GEL POST OP (GAUZE/BANDAGES/DRESSINGS) ×3 IMPLANT
YANKAUER SUCT BULB TIP 10FT TU (MISCELLANEOUS) ×3 IMPLANT

## 2020-07-13 NOTE — Progress Notes (Signed)
Orthopedic Tech Progress Note Patient Details:  Carling Liberman [facility] Nov 14, 1950 [id number]  CPM Left Knee CPM Left Knee: Off Left Knee Flexion (Degrees): 40 Left Knee Extension (Degrees): 10  Post Interventions Patient Tolerated: Well  Saul Fordyce 07/13/2020, 3:48 PM

## 2020-07-13 NOTE — Progress Notes (Signed)
Orthopedic Tech Progress Note Patient Details:  Darlene [name] [facility] 02-Mar-1951 [id number]  CPM Left Knee CPM Left Knee: On Left Knee Flexion (Degrees): 40 Left Knee Extension (Degrees): 10  Post Interventions Patient Tolerated: Well Ortho Devices Type of Ortho Device: CPM padding Ortho Device/Splint Interventions: Ordered, Application, Adjustment   Post Interventions Patient Tolerated: Well   Jennye Moccasin 07/13/2020, 11:32 AM

## 2020-07-13 NOTE — Transfer of Care (Signed)
Immediate Anesthesia Transfer of Care Note  Patient: Darlene [name]  Procedure(s) Performed: Procedure(s) with comments: TOTAL KNEE ARTHROPLASTY (Left) -  Patient Location: PACU  Anesthesia Type:Spinal  Level of Consciousness:  sedated, patient cooperative and responds to stimulation  Airway & Oxygen Therapy:Patient Spontanous Breathing and Patient connected to face mask oxgen  Post-op Assessment:  Report given to PACU RN and Post -op Vital signs reviewed and stable  Post vital signs:  Reviewed and stable  Last Vitals:  Vitals:   07/13/20 0826 07/13/20 0827  BP:  130/73  Pulse: 74 68  Resp: 20 13  Temp:    SpO2: 100% 100%    Complications: No apparent anesthesia complications

## 2020-07-13 NOTE — Anesthesia Preprocedure Evaluation (Signed)
Anesthesia Evaluation  Patient identified by MRN, date of birth, ID band Patient awake    Reviewed: Allergy & Precautions, NPO status , Patient's Chart, lab work & pertinent test results  Airway Mallampati: II  TM Distance: >3 FB Neck ROM: Full    Dental  (+) Teeth Intact   Pulmonary neg pulmonary ROS,    Pulmonary exam normal breath sounds clear to auscultation       Cardiovascular hypertension, Pt. on medications Normal cardiovascular exam Rhythm:Regular Rate:Normal     Neuro/Psych  Headaches, PSYCHIATRIC DISORDERS Anxiety Depression    GI/Hepatic negative GI ROS, Neg liver ROS,   Endo/Other  negative endocrine ROSObesity   Renal/GU negative Renal ROS     Musculoskeletal  (+) Arthritis , Osteoarthritis,    Abdominal   Peds  Hematology negative hematology ROS (+) PLT 353K   Anesthesia Other Findings Day of surgery medications reviewed with the patient.  Reproductive/Obstetrics                             Anesthesia Physical Anesthesia Plan  ASA: II  Anesthesia Plan: Spinal   Post-op Pain Management:  Regional for Post-op pain   Induction: Intravenous  PONV Risk Score and Plan: 2 and Propofol infusion, Treatment may vary due to age or medical condition and Midazolam  Airway Management Planned: Nasal Cannula and Natural Airway  Additional Equipment:   Intra-op Plan:   Post-operative Plan:   Informed Consent: I have reviewed the patients History and Physical, chart, labs and discussed the procedure including the risks, benefits and alternatives for the proposed anesthesia with the patient or authorized representative who has indicated his/her understanding and acceptance.       Plan Discussed with: CRNA  Anesthesia Plan Comments:         Anesthesia Quick Evaluation

## 2020-07-13 NOTE — Anesthesia Postprocedure Evaluation (Signed)
Anesthesia Post Note  Patient: Darlene [name]  Procedure(s) Performed: TOTAL KNEE ARTHROPLASTY (Left Knee)     Patient location during evaluation: PACU Anesthesia Type: Spinal Level of consciousness: oriented, awake and alert and awake Pain management: pain level controlled Vital Signs Assessment: post-procedure vital signs reviewed and stable Respiratory status: spontaneous breathing, respiratory function stable and nonlabored ventilation Cardiovascular status: blood pressure returned to baseline and stable Postop Assessment: no headache, no backache, no apparent nausea or vomiting, spinal receding and patient able to bend at knees Anesthetic complications: no   No complications documented.  Last Vitals:  Vitals:   07/13/20 1352 07/13/20 1453  BP: 121/62 (!) 115/59  Pulse: (!) 52 (!) 54  Resp: 15 18  Temp: (!) 36.3 C   SpO2: 100% 100%    Last Pain:  Vitals:   07/13/20 1352  TempSrc: Oral  PainSc: 0-No pain                 Cecile Hearing

## 2020-07-13 NOTE — Progress Notes (Signed)
AssistedDr. Turk with left, ultrasound guided, adductor canal block. Side rails up, monitors on throughout procedure. See vital signs in flow sheet. Tolerated Procedure well.  

## 2020-07-13 NOTE — Evaluation (Signed)
Physical Therapy Evaluation Patient Details Name: Darlene [name] MRN: [id number] DOB: [birth year] Today's Date: 07/13/2020   History of Present Illness  s/p L TKA  Clinical Impression  Pt is s/p TKA resulting in the deficits listed below (see PT Problem List).  Pt doing well today, amb ~40' with min assist. Initiated TKA HEP  Pt will benefit from skilled PT to increase their independence and safety with mobility to allow discharge to the venue listed below.      Follow Up Recommendations Follow surgeons recommendation for DC plan and follow-up therapies    Equipment Recommendations  None recommended by PT    Recommendations for Other Services       Precautions / Restrictions Precautions Precautions: Fall;Knee Required Braces or Orthoses: Knee Immobilizer - Left Knee Immobilizer - Left: Discontinue once straight leg raise with < 10 degree lag Restrictions Weight Bearing Restrictions: No      Mobility  Bed Mobility Overal bed mobility: Needs Assistance Bed Mobility: Supine to Sit     Supine to sit: Min assist;Min guard     General bed mobility comments: assist with LLE to initiate movement  Transfers Overall transfer level: Needs assistance Equipment used: Rolling walker (2 wheeled) Transfers: Sit to/from Stand Sit to Stand: Min assist         General transfer comment: assist to rise and transiton to RW, cues for hand placement  Ambulation/Gait Ambulation/Gait assistance: Min assist Gait Distance (Feet): 40 Feet Assistive device: Rolling walker (2 wheeled) Gait Pattern/deviations: Step-to pattern;Decreased stance time - left     General Gait Details: cues for sequence and RW position  Stairs            Wheelchair Mobility    Modified Rankin (Stroke Patients Only)       Balance                                             Pertinent Vitals/Pain Pain Assessment: 0-10 Pain Score: 4  Pain Location: L knee Pain  Descriptors / Indicators: Aching;Grimacing Pain Intervention(s): Monitored during session;Limited activity within patient's tolerance;Premedicated before session;Repositioned;Ice applied    Home Living Family/patient expects to be discharged to:: Private residence Living Arrangements: Children Available Help at Discharge: Family;Available PRN/intermittently Type of Home: House Home Access: Level entry     Home Layout: One level Home Equipment: Walker - 2 wheels;Bedside commode;Shower seat;Grab bars - tub/shower      Prior Function                 Hand Dominance        Extremity/Trunk Assessment   Upper Extremity Assessment Upper Extremity Assessment: Overall WFL for tasks assessed    Lower Extremity Assessment Lower Extremity Assessment: LLE deficits/detail LLE Deficits / Details: ankle WFL, knee extension with hip flexion 2+/5. AAROM grossly 8 to 60 degrees flexion       Communication   Communication: No difficulties  Cognition                                              General Comments      Exercises Total Joint Exercises Ankle Circles/Pumps: AROM;Both;10 reps Quad Sets: 5 reps;Both;AROM Straight Leg Raises: AAROM;AROM;5 reps   Assessment/Plan    PT Assessment  Patient needs continued PT services  PT Problem List Decreased strength;Decreased mobility;Decreased range of motion;Decreased activity tolerance;Decreased knowledge of use of DME;Pain       PT Treatment Interventions Gait training;Functional mobility training;DME instruction;Therapeutic activities;Therapeutic exercise;Patient/family education    PT Goals (Current goals can be found in the Care Plan section)  Acute Rehab PT Goals Patient Stated Goal: back to independence PT Goal Formulation: With patient Time For Goal Achievement: 07/20/20    Frequency 7X/week   Barriers to discharge        Co-evaluation               AM-PAC PT "6 Clicks" Mobility   Outcome Measure Help needed turning from your back to your side while in a flat bed without using bedrails?: A Little Help needed moving from lying on your back to sitting on the side of a flat bed without using bedrails?: A Little Help needed moving to and from a bed to a chair (including a wheelchair)?: A Little Help needed standing up from a chair using your arms (e.g., wheelchair or bedside chair)?: A Little Help needed to walk in hospital room?: A Little Help needed climbing 3-5 steps with a railing? : A Little 6 Click Score: 18    End of Session Equipment Utilized During Treatment: Gait belt (no KI, IND SLRs today) Activity Tolerance: Patient tolerated treatment well Patient left: with call bell/phone within reach;in chair;with chair alarm set;with family/visitor present Nurse Communication: Mobility status PT Visit Diagnosis: Difficulty in walking, not elsewhere classified (R26.2)    Time: 5974-1638 PT Time Calculation (min) (ACUTE ONLY): 19 min   Charges:   PT Evaluation $PT Eval Low Complexity: 1 Low          Danon Lograsso, PT  Acute Rehab Dept ([facility]) [phone] Pager [phone]  07/13/2020   [facility] 07/13/2020, 4:27 PM

## 2020-07-13 NOTE — Anesthesia Procedure Notes (Signed)
Spinal  Patient location during procedure: OR Start time: 07/13/2020 9:10 AM End time: 07/13/2020 9:13 AM Staffing Performed: anesthesiologist  Anesthesiologist: Cecile Hearing, MD Preanesthetic Checklist Completed: patient identified, IV checked, risks and benefits discussed, surgical consent, monitors and equipment checked, pre-op evaluation and timeout performed Spinal Block Patient position: sitting Prep: DuraPrep and site prepped and draped Patient monitoring: continuous pulse ox and blood pressure Approach: midline Location: L3-4 Injection technique: single-shot Needle Needle type: Pencan  Needle gauge: 24 G Additional Notes Functioning IV was confirmed and monitors were applied. Sterile prep and drape, including hand hygiene, mask and sterile gloves were used. The patient was positioned and the spine was prepped. The skin was anesthetized with lidocaine.  Free flow of clear CSF was obtained prior to injecting local anesthetic into the CSF.  The spinal needle aspirated freely following injection.  The needle was carefully withdrawn.  The patient tolerated the procedure well. Consent was obtained prior to procedure with all questions answered and concerns addressed. Risks including but not limited to bleeding, infection, nerve damage, paralysis, failed block, inadequate analgesia, allergic reaction, high spinal, itching and headache were discussed and the patient wished to proceed.   Arrie Aran, MD

## 2020-07-13 NOTE — Discharge Instructions (Addendum)
 Frank Aluisio, MD Total Joint Specialist [facility] [address] [place],  [address] [phone]  TOTAL KNEE REPLACEMENT POSTOPERATIVE DIRECTIONS    Knee Rehabilitation, Guidelines Following Surgery  Results after knee surgery are often greatly improved when you follow the exercise, range of motion and muscle strengthening exercises prescribed by your doctor. Safety measures are also important to protect the knee from further injury. If any of these exercises cause you to have increased pain or swelling in your knee joint, decrease the amount until you are comfortable again and slowly increase them. If you have problems or questions, call your caregiver or physical therapist for advice.   BLOOD CLOT PREVENTION . Take a 325 mg Aspirin two times a day for three weeks following surgery. Then take an 81 mg Aspirin once a day for three weeks. Then discontinue Aspirin. . You may resume your vitamins/supplements upon discharge from the hospital. . Do not take any NSAIDs (Advil, Aleve, Ibuprofen, Meloxicam, etc.) until you have discontinued the 325 mg Aspirin.  HOME CARE INSTRUCTIONS  . Remove items at home which could result in a fall. This includes throw rugs or furniture in walking pathways.  . ICE to the affected knee as much as tolerated. Icing helps control swelling. If the swelling is well controlled you will be more comfortable and rehab easier. Continue to use ice on the knee for pain and swelling from surgery. You may notice swelling that will progress down to the foot and ankle. This is normal after surgery. Elevate the leg when you are not up walking on it.    . Continue to use the breathing machine which will help keep your temperature down. It is common for your temperature to cycle up and down following surgery, especially at night when you are not up moving around and exerting yourself. The breathing machine keeps your lungs expanded and your  temperature down. . Do not place pillow under the operative knee, focus on keeping the knee straight while resting  DIET You may resume your previous home diet once you are discharged from the hospital.  DRESSING / WOUND CARE / SHOWERING . Keep your bulky bandage on for 2 days. On the third post-operative day you may remove the Ace bandage and gauze. There is a waterproof adhesive bandage on your skin which will stay in place until your first follow-up appointment. Once you remove this you will not need to place another bandage . You may begin showering 3 days following surgery, but do not submerge the incision under water.  ACTIVITY For the first 5 days, the key is rest and control of pain and swelling . Do your home exercises twice a day starting on post-operative day 3. On the days you go to physical therapy, just do the home exercises once that day. . You should rest, ice and elevate the leg for 50 minutes out of every hour. Get up and walk/stretch for 10 minutes per hour. After 5 days you can increase your activity slowly as tolerated. . Walk with your walker as instructed. Use the walker until you are comfortable transitioning to a cane. Walk with the cane in the opposite hand of the operative leg. You may discontinue the cane once you are comfortable and walking steadily. . Avoid periods of inactivity such as sitting longer than an hour when not asleep. This helps prevent blood clots.  . You may discontinue the knee immobilizer once you are able to perform a straight   leg raise while lying down. . You may resume a sexual relationship in one month or when given the OK by your doctor.  . You may return to work once you are cleared by your doctor.  . Do not drive a car for 6 weeks or until released by your surgeon.  . Do not drive while taking narcotics.  TED HOSE STOCKINGS Wear the elastic stockings on both legs for three weeks following surgery during the day. You may remove them at night  for sleeping.  WEIGHT BEARING Weight bearing as tolerated with assist device (walker, cane, etc) as directed, use it as long as suggested by your surgeon or therapist, typically at least 4-6 weeks.  POSTOPERATIVE CONSTIPATION PROTOCOL Constipation - defined medically as fewer than three stools per week and severe constipation as less than one stool per week.  One of the most common issues patients have following surgery is constipation.  Even if you have a regular bowel pattern at home, your normal regimen is likely to be disrupted due to multiple reasons following surgery.  Combination of anesthesia, postoperative narcotics, change in appetite and fluid intake all can affect your bowels.  In order to avoid complications following surgery, here are some recommendations in order to help you during your recovery period.  . Colace (docusate) - Pick up an over-the-counter form of Colace or another stool softener and take twice a day as long as you are requiring postoperative pain medications.  Take with a full glass of water daily.  If you experience loose stools or diarrhea, hold the colace until you stool forms back up. If your symptoms do not get better within 1 week or if they get worse, check with your doctor. . Dulcolax (bisacodyl) - Pick up over-the-counter and take as directed by the product packaging as needed to assist with the movement of your bowels.  Take with a full glass of water.  Use this product as needed if not relieved by Colace only.  . MiraLax (polyethylene glycol) - Pick up over-the-counter to have on hand. MiraLax is a solution that will increase the amount of water in your bowels to assist with bowel movements.  Take as directed and can mix with a glass of water, juice, soda, coffee, or tea. Take if you go more than two days without a movement. Do not use MiraLax more than once per day. Call your doctor if you are still constipated or irregular after using this medication for 7 days  in a row.  If you continue to have problems with postoperative constipation, please contact the office for further assistance and recommendations.  If you experience "the worst abdominal pain ever" or develop nausea or vomiting, please contact the office immediatly for further recommendations for treatment.  ITCHING If you experience itching with your medications, try taking only a single pain pill, or even half a pain pill at a time.  You can also use Benadryl over the counter for itching or also to help with sleep.   MEDICATIONS See your medication summary on the "After Visit Summary" that the nursing staff will review with you prior to discharge.  You may have some home medications which will be placed on hold until you complete the course of blood thinner medication.  It is important for you to complete the blood thinner medication as prescribed by your surgeon.  Continue your approved medications as instructed at time of discharge.  PRECAUTIONS . If you experience chest pain or shortness of   breath - call 911 immediately for transfer to the hospital emergency department.  . If you develop a fever greater that 101 F, purulent drainage from wound, increased redness or drainage from wound, foul odor from the wound/dressing, or calf pain - CONTACT YOUR SURGEON.                                                   FOLLOW-UP APPOINTMENTS Make sure you keep all of your appointments after your operation with your surgeon and caregivers. You should call the office at the above phone number and make an appointment for approximately two weeks after the date of your surgery or on the date instructed by your surgeon outlined in the "After Visit Summary".  RANGE OF MOTION AND STRENGTHENING EXERCISES  Rehabilitation of the knee is important following a knee injury or an operation. After just a few days of immobilization, the muscles of the thigh which control the knee become weakened and shrink (atrophy). Knee  exercises are designed to build up the tone and strength of the thigh muscles and to improve knee motion. Often times heat used for twenty to thirty minutes before working out will loosen up your tissues and help with improving the range of motion but do not use heat for the first two weeks following surgery. These exercises can be done on a training (exercise) mat, on the floor, on a table or on a bed. Use what ever works the best and is most comfortable for you Knee exercises include:  . Leg Lifts - While your knee is still immobilized in a splint or cast, you can do straight leg raises. Lift the leg to 60 degrees, hold for 3 sec, and slowly lower the leg. Repeat 10-20 times 2-3 times daily. Perform this exercise against resistance later as your knee gets better.  . Quad and Hamstring Sets - Tighten up the muscle on the front of the thigh (Quad) and hold for 5-10 sec. Repeat this 10-20 times hourly. Hamstring sets are done by pushing the foot backward against an object and holding for 5-10 sec. Repeat as with quad sets.   Leg Slides: Lying on your back, slowly slide your foot toward your buttocks, bending your knee up off the floor (only go as far as is comfortable). Then slowly slide your foot back down until your leg is flat on the floor again.  Angel Wings: Lying on your back spread your legs to the side as far apart as you can without causing discomfort.  A rehabilitation program following serious knee injuries can speed recovery and prevent re-injury in the future due to weakened muscles. Contact your doctor or a physical therapist for more information on knee rehabilitation.   IF YOU ARE TRANSFERRED TO A SKILLED REHAB FACILITY If the patient is transferred to a skilled rehab facility following release from the hospital, a list of the current medications will be sent to the facility for the patient to continue.  When discharged from the skilled rehab facility, please have the facility set up the  patient's Home Health Physical Therapy prior to being released. Also, the skilled facility will be responsible for providing the patient with their medications at time of release from the facility to include their pain medication, the muscle relaxants, and their blood thinner medication. If the patient is still at the   rehab facility at time of the two week follow up appointment, the skilled rehab facility will also need to assist the patient in arranging follow up appointment in our office and any transportation needs.  MAKE SURE YOU:  . Understand these instructions.  . Get help right away if you are not doing well or get worse.   DENTAL ANTIBIOTICS:  In most cases prophylactic antibiotics for Dental procdeures after total joint surgery are not necessary.  Exceptions are as follows:  1. History of prior total joint infection  2. Severely immunocompromised (Organ Transplant, cancer chemotherapy, Rheumatoid biologic meds such as Humera)  3. Poorly controlled diabetes (A1C &gt; 8.0, blood glucose over 200)  If you have one of these conditions, contact your surgeon for an antibiotic prescription, prior to your dental procedure.    Pick up stool softner and laxative for home use following surgery while on pain medications. Do not submerge incision under water. Please use good hand washing techniques while changing dressing each day. May shower starting three days after surgery. Please use a clean towel to pat the incision dry following showers. Continue to use ice for pain and swelling after surgery. Do not use any lotions or creams on the incision until instructed by your surgeon.  

## 2020-07-13 NOTE — Op Note (Signed)
OPERATIVE REPORT-TOTAL KNEE ARTHROPLASTY   Pre-operative diagnosis- Osteoarthritis  Left knee(s)  Post-operative diagnosis- Osteoarthritis Left knee(s)  Procedure-  Left  Total Knee Arthroplasty  Surgeon- Gus Rankin. Peggye Poon, MD  Assistant- Arther Abbott, PA-C   Anesthesia-  Adductor canal block and spinal  EBL-25 mL   Drains None  Tourniquet time-  Total Tourniquet Time Documented: Thigh (Left) - 40 minutes Total: Thigh (Left) - 40 minutes     Complications- None  Condition-PACU - hemodynamically stable.   Brief Clinical Note  Darlene [name] is a 69 y.o. year old female with end stage OA of her left knee with progressively worsening pain and dysfunction. She has constant pain, with activity and at rest and significant functional deficits with difficulties even with ADLs. She has had extensive non-op management including analgesics, injections of cortisone and viscosupplements, and home exercise program, but remains in significant pain with significant dysfunction. Radiographs show bone on bone arthritis medial and patellofemoral. She presents now for left Total Knee Arthroplasty.    Procedure in detail---   The patient is brought into the operating room and positioned supine on the operating table. After successful administration of  Adductor canal block and spinal,   a tourniquet is placed high on the  Left thigh(s) and the lower extremity is prepped and draped in the usual sterile fashion. Time out is performed by the operating team and then the  Left lower extremity is wrapped in Esmarch, knee flexed and the tourniquet inflated to 300 mmHg.       A midline incision is made with a ten blade through the subcutaneous tissue to the level of the extensor mechanism. A fresh blade is used to make a medial parapatellar arthrotomy. Soft tissue over the proximal medial tibia is subperiosteally elevated to the joint line with a knife and into the semimembranosus bursa with a Cobb  elevator. Soft tissue over the proximal lateral tibia is elevated with attention being paid to avoiding the patellar tendon on the tibial tubercle. The patella is everted, knee flexed 90 degrees and the ACL and PCL are removed. Findings are bone on bone medial and patellofemoral with large global osteophytes.        The drill is used to create a starting hole in the distal femur and the canal is thoroughly irrigated with sterile saline to remove the fatty contents. The 5 degree Left  valgus alignment guide is placed into the femoral canal and the distal femoral cutting block is pinned to remove 9 mm off the distal femur. Resection is made with an oscillating saw.      The tibia is subluxed forward and the menisci are removed. The extramedullary alignment guide is placed referencing proximally at the medial aspect of the tibial tubercle and distally along the second metatarsal axis and tibial crest. The block is pinned to remove 31mm off the more deficient medial  side. Resection is made with an oscillating saw. Size 4is the most appropriate size for the tibia and the proximal tibia is prepared with the modular drill and keel punch for that size.      The femoral sizing guide is placed and size 4 is most appropriate. Rotation is marked off the epicondylar axis and confirmed by creating a rectangular flexion gap at 90 degrees. The size 4 cutting block is pinned in this rotation and the anterior, posterior and chamfer cuts are made with the oscillating saw. The intercondylar block is then placed and that cut is made.  Trial size 4 tibial component, trial size 4 posterior stabilized femur and a 10  mm posterior stabilized rotating platform insert trial is placed. Full extension is achieved with excellent varus/valgus and anterior/posterior balance throughout full range of motion. The patella is everted and thickness measured to be 22  mm. Free hand resection is taken to 12 mm, a 35 template is placed, lug holes  are drilled, trial patella is placed, and it tracks normally. Osteophytes are removed off the posterior femur with the trial in place. All trials are removed and the cut bone surfaces prepared with pulsatile lavage. Cement is mixed and once ready for implantation, the size 4 tibial implant, size  4 posterior stabilized femoral component, and the size 35 patella are cemented in place and the patella is held with the clamp. The trial insert is placed and the knee held in full extension. The Exparel (20 ml mixed with 60 ml saline) is injected into the extensor mechanism, posterior capsule, medial and lateral gutters and subcutaneous tissues.  All extruded cement is removed and once the cement is hard the permanent 10 mm posterior stabilized rotating platform insert is placed into the tibial tray.      The wound is copiously irrigated with saline solution and the extensor mechanism closed with # 0 Stratofix suture. The tourniquet is released for a total tourniquet time of 40  minutes. Flexion against gravity is 130 degrees and the patella tracks normally. Subcutaneous tissue is closed with 2.0 vicryl and subcuticular with running 4.0 Monocryl. The incision is cleaned and dried and steri-strips and a bulky sterile dressing are applied. The limb is placed into a knee immobilizer and the patient is awakened and transported to recovery in stable condition.      Please note that a surgical assistant was a medical necessity for this procedure in order to perform it in a safe and expeditious manner. Surgical assistant was necessary to retract the ligaments and vital neurovascular structures to prevent injury to them and also necessary for proper positioning of the limb to allow for anatomic placement of the prosthesis.   Gus Rankin Traivon Morrical, MD    07/13/2020, 10:21 AM

## 2020-07-13 NOTE — Anesthesia Procedure Notes (Signed)
Anesthesia Regional Block: Adductor canal block   Pre-Anesthetic Checklist: ,, timeout performed, Correct Patient, Correct Site, Correct Laterality, Correct Procedure, Correct Position, site marked, Risks and benefits discussed,  Surgical consent,  Pre-op evaluation,  At surgeon's request and post-op pain management  Laterality: Left  Prep: chloraprep       Needles:  Injection technique: Single-shot  Needle Type: Echogenic Needle     Needle Length: 9cm  Needle Gauge: 21     Additional Needles:   Procedures:,,,, ultrasound used (permanent image in chart),,,,  Narrative:  Start time: 07/13/2020 8:19 AM End time: 07/13/2020 8:24 AM Injection made incrementally with aspirations every 5 mL.  Performed by: Personally  Anesthesiologist: Cecile Hearing, MD  Additional Notes: No pain on injection. No increased resistance to injection. Injection made in 5cc increments.  Good needle visualization.  Patient tolerated procedure well.

## 2020-07-13 NOTE — Plan of Care (Signed)
  Problem: Education: Goal: Knowledge of General Education information will improve Description: Including pain rating scale, medication(s)/side effects and non-pharmacologic comfort measures Outcome: Progressing   Problem: Health Behavior/Discharge Planning: Goal: Ability to manage health-related needs will improve Outcome: Progressing   Problem: Clinical Measurements: Goal: Ability to maintain clinical measurements within normal limits will improve Outcome: Progressing Goal: Will remain free from infection Outcome: Progressing Goal: Diagnostic test results will improve Outcome: Progressing Goal: Respiratory complications will improve Outcome: Progressing Goal: Cardiovascular complication will be avoided Outcome: Progressing   Problem: Activity: Goal: Risk for activity intolerance will decrease Outcome: Progressing   Problem: Nutrition: Goal: Adequate nutrition will be maintained Outcome: Progressing   Problem: Coping: Goal: Level of anxiety will decrease Outcome: Progressing   Problem: Elimination: Goal: Will not experience complications related to bowel motility Outcome: Progressing Goal: Will not experience complications related to urinary retention Outcome: Progressing   Problem: Pain Managment: Goal: General experience of comfort will improve Outcome: Progressing   Problem: Safety: Goal: Ability to remain free from injury will improve Outcome: Progressing   Problem: Skin Integrity: Goal: Risk for impaired skin integrity will decrease Outcome: Progressing   Problem: Education: Goal: Knowledge of the prescribed therapeutic regimen will improve Outcome: Progressing Goal: Individualized Educational Video(s) Outcome: Progressing   Problem: Activity: Goal: Ability to avoid complications of mobility impairment will improve Outcome: Progressing Goal: Range of joint motion will improve Outcome: Progressing   Problem: Clinical Measurements: Goal:  Postoperative complications will be avoided or minimized Outcome: Progressing   Problem: Skin Integrity: Goal: Will show signs of wound healing Outcome: Progressing   Problem: Pain Management: Goal: Pain level will decrease with appropriate interventions Outcome: Progressing   

## 2020-07-13 NOTE — Interval H&P Note (Signed)
History and Physical Interval Note:  07/13/2020 7:01 AM  Darlene [name]  has presented today for surgery, with the diagnosis of LEFT  knee osteoarthritis.  The various methods of treatment have been discussed with the patient and family. After consideration of risks, benefits and other options for treatment, the patient has consented to  Procedure(s) with comments: TOTAL KNEE ARTHROPLASTY (Left) - as a surgical intervention.  The patient's history has been reviewed, patient examined, no change in status, stable for surgery.  I have reviewed the patient's chart and labs.  Questions were answered to the patient's satisfaction.     Homero Fellers Aashir Umholtz

## 2020-07-14 ENCOUNTER — Encounter (HOSPITAL_COMMUNITY): Payer: Self-pay | Admitting: Orthopedic Surgery

## 2020-07-14 DIAGNOSIS — M1712 Unilateral primary osteoarthritis, left knee: Secondary | ICD-10-CM | POA: Diagnosis not present

## 2020-07-14 LAB — CBC
HCT: 36.1 % (ref 36.0–46.0)
Hemoglobin: 11.1 g/dL — ABNORMAL LOW (ref 12.0–15.0)
MCH: 27.2 pg (ref 26.0–34.0)
MCHC: 30.7 g/dL (ref 30.0–36.0)
MCV: 88.5 fL (ref 80.0–100.0)
Platelets: 222 10*3/uL (ref 150–400)
RBC: 4.08 MIL/uL (ref 3.87–5.11)
RDW: 13 % (ref 11.5–15.5)
WBC: 12.2 10*3/uL — ABNORMAL HIGH (ref 4.0–10.5)
nRBC: 0 % (ref 0.0–0.2)

## 2020-07-14 LAB — GLUCOSE, CAPILLARY
Glucose-Capillary: 105 mg/dL — ABNORMAL HIGH (ref 70–99)
Glucose-Capillary: 167 mg/dL — ABNORMAL HIGH (ref 70–99)
Glucose-Capillary: 145 mg/dL — ABNORMAL HIGH (ref 70–99)
Glucose-Capillary: 108 mg/dL — ABNORMAL HIGH (ref 70–99)

## 2020-07-14 LAB — BASIC METABOLIC PANEL
Anion gap: 10 (ref 5–15)
BUN: 14 mg/dL (ref 8–23)
CO2: 25 mmol/L (ref 22–32)
Calcium: 8.7 mg/dL — ABNORMAL LOW (ref 8.9–10.3)
Chloride: 103 mmol/L (ref 98–111)
Creatinine, Ser: 0.74 mg/dL (ref 0.44–1.00)
GFR, Estimated: >60 mL/min (ref >60)
Glucose, Bld: 131 mg/dL — ABNORMAL HIGH (ref 70–99)
Potassium: 4.1 mmol/L (ref 3.5–5.1)
Sodium: 138 mmol/L (ref 135–145)

## 2020-07-14 MED ORDER — GABAPENTIN 300 MG PO CAPS
ORAL_CAPSULE | ORAL | 0 refills | Status: DC
Start: 2020-07-14 — End: 2020-11-03

## 2020-07-14 MED ORDER — ASPIRIN 325 MG PO TBEC: 325.0000 mg | DELAYED_RELEASE_TABLET | Freq: Two times a day (BID) | ORAL | 0 refills | Status: AC

## 2020-07-14 MED ORDER — SODIUM CHLORIDE 0.9 % IV BOLUS
500.0000 mL | Freq: Once | INTRAVENOUS | Status: AC
  Administered 2020-07-14: 500 mL via INTRAVENOUS

## 2020-07-14 MED ORDER — OXYCODONE HCL 5 MG PO TABS
5.0000 mg | ORAL_TABLET | Freq: Four times a day (QID) | ORAL | 0 refills | Status: DC | PRN
Start: 2020-07-14 — End: 2020-09-28

## 2020-07-14 MED ORDER — METHOCARBAMOL 500 MG PO TABS: 500.0000 mg | ORAL_TABLET | Freq: Four times a day (QID) | ORAL | 0 refills | Status: DC | PRN

## 2020-07-14 MED ORDER — TRAMADOL HCL 50 MG PO TABS
50.0000 mg | ORAL_TABLET | Freq: Four times a day (QID) | ORAL | 0 refills | Status: DC | PRN
Start: 2020-07-14 — End: 2020-11-03

## 2020-07-14 NOTE — Progress Notes (Signed)
Orthopedic Tech Progress Note Patient Details:  Darlene [name] [facility] 1951-09-20 [id number]  CPM Left Knee CPM Left Knee: Off Left Knee Flexion (Degrees): 40 Left Knee Extension (Degrees): 10 Additional Comments: pt refused  Post Interventions Patient Tolerated: Well  Saul Fordyce 07/14/2020, 5:28 PM

## 2020-07-14 NOTE — Progress Notes (Signed)
Physical Therapy Treatment Patient Details Name: Darlene [name] MRN: [id number] DOB: [birth year] Today's Date: 07/14/2020    History of Present Illness s/p L TKA    PT Comments    Continues to progress slowly, limited by pain (despite having had meds, ice, etc) and fatigue.  Will likely need another day to work with PT and on pain control. Continue PT in acute setting  Follow Up Recommendations  Follow surgeon's recommendation for DC plan and follow-up therapies;Supervision for mobility/OOB     Equipment Recommendations  None recommended by PT    Recommendations for Other Services       Precautions / Restrictions Precautions Precautions: Fall;Knee Required Braces or Orthoses: Knee Immobilizer - Left Knee Immobilizer - Left: Discontinue once straight leg raise with < 10 degree lag Restrictions Weight Bearing Restrictions: No    Mobility  Bed Mobility Overal bed mobility: Needs Assistance Bed Mobility: Supine to Sit     Supine to sit: Min assist     General bed mobility comments: cues for technique, assist with LLE   Transfers Overall transfer level: Needs assistance Equipment used: Rolling walker (2 wheeled) Transfers: Sit to/from [employer] Sit to Stand: Min assist Stand pivot transfers: Min assist       General transfer comment: assist to rise and transiton to RW, cues for hand placement. incr time. limited by pain   Ambulation/Gait Ambulation/Gait assistance: Min assist Gait Distance (Feet): 3 Feet Assistive device: Rolling walker (2 wheeled) Gait Pattern/deviations: Step-to pattern;Decreased stance time - left     General Gait Details: cues for sequence and RW position (pivotal steps to chair )   Stairs             Wheelchair Mobility    Modified Rankin (Stroke Patients Only)       Balance                                            Cognition Arousal/Alertness: Awake/alert Behavior During  Therapy: WFL for tasks assessed/performed Overall Cognitive Status: Within Functional Limits for tasks assessed                                        Exercises Total Joint Exercises Ankle Circles/Pumps: AROM;Both;10 reps Quad Sets: Both;AROM;10 reps Heel Slides: AAROM;Left;10 reps Hip ABduction/ADduction: AAROM;Left;10 reps Straight Leg Raises: AAROM;10 reps Goniometric ROM: grossly 12 to 50 degrees flexion     General Comments        Pertinent Vitals/Pain Pain Assessment: 0-10 Pain Score: 9  Pain Location: L knee Pain Descriptors / Indicators: Aching;Grimacing;Crying Pain Intervention(s): Limited activity within patient's tolerance;Monitored during session;Premedicated before session;Repositioned;Ice applied;Utilized relaxation techniques    Home Living                      Prior Function            PT Goals (current goals can now be found in the care plan section) Acute Rehab PT Goals Patient Stated Goal: back to independence PT Goal Formulation: With patient Time For Goal Achievement: 07/20/20 Progress towards PT goals: Progressing toward goals    Frequency    7X/week      PT Plan Current plan remains appropriate    Co-evaluation  AM-PAC PT "6 Clicks" Mobility   Outcome Measure  Help needed turning from your back to your side while in a flat bed without using bedrails?: A Little Help needed moving from lying on your back to sitting on the side of a flat bed without using bedrails?: A Little Help needed moving to and from a bed to a chair (including a wheelchair)?: A Little Help needed standing up from a chair using your arms (e.g., wheelchair or bedside chair)?: A Lot Help needed to walk in hospital room?: A Little Help needed climbing 3-5 steps with a railing? : A Lot 6 Click Score: 16    End of Session Equipment Utilized During Treatment: Gait belt;Left knee immobilizer Activity Tolerance: Patient limited  by fatigue;Patient limited by pain Patient left: in chair;with call bell/phone within reach;with chair alarm set Nurse Communication: Mobility status PT Visit Diagnosis: Difficulty in walking, not elsewhere classified (R26.2)     Time: 9678-9381 PT Time Calculation (min) (ACUTE ONLY): 29 min  Charges:  $Therapeutic Exercise: 8-22 mins $Therapeutic Activity: 8-22 mins                     Delice Bison, PT  Acute Rehab Dept ([facility]) [phone] Pager [phone]  07/14/2020    [facility] 07/14/2020, 3:19 PM

## 2020-07-14 NOTE — Progress Notes (Signed)
Notified Kern Alberta PA that patient has not voided and bladder scan only shows 112 ml. Awaiting new orders.

## 2020-07-14 NOTE — Progress Notes (Signed)
07/14/20 2040  What Happened  Was fall witnessed? Yes  Who witnessed fall? Carole Civil, NT  Patients activity before fall bathroom-assisted;to/from bed, chair, or stretcher  Point of contact buttocks  Was patient injured? No  Follow Up  MD notified Dion Saucier PA-C  Time MD notified [phone]  Family notified No - patient refusal (pt will let them know herself tomorrow)  Additional tests No  Simple treatment Ice  Progress note created (see row info) Yes  Adult Fall Risk Assessment  Risk Factor Category (scoring not indicated) Fall has occurred during this admission (document High fall risk)  Age 69  Fall History: Fall within 6 months prior to admission 0  Elimination; Bowel and/or Urine Incontinence 0  Elimination; Bowel and/or Urine Urgency/Frequency 0  Medications: includes PCA/Opiates, Anti-convulsants, Anti-hypertensives, Diuretics, Hypnotics, Laxatives, Sedatives, and Psychotropics 5  Patient Care Equipment 2  Mobility-Assistance 2  Mobility-Gait 2  Mobility-Sensory Deficit 0  Altered awareness of immediate physical environment 0  Impulsiveness 0  Lack of understanding of one's physical/cognitive limitations 0  Total Score 12  Patient Fall Risk Level High fall risk  Adult Fall Risk Interventions  Required Bundle Interventions *See Row Information* High fall risk - low, moderate, and high requirements implemented  Additional Interventions PT/OT need assessed if change in mobility from baseline;Use of appropriate toileting equipment (bedpan, BSC, etc.)  Screening for Fall Injury Risk (To be completed on HIGH fall risk patients) - Assessing Need for Low Bed  Risk For Fall Injury- Low Bed Criteria None identified - Continue screening  Screening for Fall Injury Risk (To be completed on HIGH fall risk patients who do not meet crieteria for Low Bed) - Assessing Need for Floor Mats Only  Risk For Fall Injury- Criteria for Floor Mats None identified - No additional interventions  needed  PCA/Epidural/Spinal Assessment  Respiratory Pattern Regular;Unlabored;Symmetrical  Neurological  Neuro (WDL) WDL  Level of Consciousness Alert  Orientation Level Oriented X4  Cognition Appropriate at baseline  Speech Clear  R Foot Dorsiflexion Weak  L Foot Dorsiflexion Weak  R Foot Plantar Flexion Weak  L Foot Plantar Flexion Weak  RLE Motor Response Purposeful movement  RLE Sensation Full sensation  RLE Motor Strength 4  LLE Motor Response Purposeful movement  LLE Sensation Full sensation  LLE Motor Strength 4  R Sensory Level S1-Sole of foot, small toes  L Sensory Level S1-Sole of foot, small toes  Musculoskeletal  Musculoskeletal (WDL) X  Assistive Device Front wheel walker;BSC  Generalized Weakness Yes  Weight Bearing Restrictions No  Musculoskeletal Details  Left Knee Limited movement;Ortho/Supportive Device;Surgery;Weakness  Left Knee Ortho/Supportive Device Ace wrap;CPM  Integumentary  Integumentary (WDL) X  Skin Color Appropriate for ethnicity  Skin Condition Dry  Skin Integrity Surgical Incision (see LDA)  Skin Turgor Non-tenting  CPM Left Knee  CPM Left Knee Off   Pt was using BSC, NT assisted pt up from [facility] with pt using the FWW. Pt reported her legs feeling weak and she was feeling dizzy and pt then reported feeling her legs giving way. Pt was not close enough to be lowered to the chair so NT lowered pt to the floor on her buttocks. Pt did not hit her head, nor did she injure herself. Pt c/o 7/10 to left knee but pain is about the same as it has been throughout the day and not anything new or worse. Pt assisted up off the floor with two assist to the room recliner. Pt A&O x4, neuro assesment  within normal limits. Vital signs stable. Pt given prn pain medication. Dion Saucier PA-C on call provider for [facility] was notified. No new orders at this time, just continue to monitor. Pt resting comfortably in the recliner, chair alarm is in place and on, non  skid socks on, call bell and bedside table within reach.

## 2020-07-14 NOTE — TOC Transition Note (Signed)
Transition of Care [facility]) - CM/SW Discharge Note   Patient Details  Name: Darlene [name] MRN: [id number] Date of Birth: 1951-01-23  [facility] of [facility]) CM/SW Contact:  Lennart Pall, LCSW Phone Number: 07/14/2020, 9:32 AM   Clinical Narrative:    Met with pt to review dc arrangements.  Pt confirms she has needed DME and planning for OPPT at [facility].  Pt does ask about [place] aide assistance.  Discussed that insurance will not cover this service but provided her with private agency information and she understands she will need to hire as needed. She notes family will stay at night with her initially.  No further TOC needs.   Final next level of care: OP Rehab Barriers to Discharge: No Barriers Identified   Patient Goals and CMS Choice Patient states their goals for this hospitalization and ongoing recovery are:: return home      Discharge Placement                       Discharge Plan and Services                DME Arranged: N/A DME Agency: NA       HH Arranged: NA HH Agency: NA        Social Determinants of Health (SDOH) Interventions     Readmission Risk Interventions No flowsheet data found.

## 2020-07-14 NOTE — Plan of Care (Signed)
  Problem: Education: Goal: Knowledge of General Education information will improve Description: Including pain rating scale, medication(s)/side effects and non-pharmacologic comfort measures Outcome: Progressing   Problem: Health Behavior/Discharge Planning: Goal: Ability to manage health-related needs will improve Outcome: Progressing   Problem: Clinical Measurements: Goal: Ability to maintain clinical measurements within normal limits will improve Outcome: Progressing Goal: Will remain free from infection Outcome: Progressing Goal: Diagnostic test results will improve Outcome: Progressing Goal: Respiratory complications will improve Outcome: Progressing Goal: Cardiovascular complication will be avoided Outcome: Progressing   Problem: Activity: Goal: Risk for activity intolerance will decrease Outcome: Progressing   Problem: Nutrition: Goal: Adequate nutrition will be maintained Outcome: Progressing   Problem: Coping: Goal: Level of anxiety will decrease Outcome: Progressing   Problem: Elimination: Goal: Will not experience complications related to bowel motility Outcome: Progressing Goal: Will not experience complications related to urinary retention Outcome: Progressing   Problem: Pain Managment: Goal: General experience of comfort will improve Outcome: Progressing   Problem: Safety: Goal: Ability to remain free from injury will improve Outcome: Progressing   Problem: Skin Integrity: Goal: Risk for impaired skin integrity will decrease Outcome: Progressing   Problem: Activity: Goal: Ability to avoid complications of mobility impairment will improve Outcome: Progressing   Problem: Clinical Measurements: Goal: Postoperative complications will be avoided or minimized Outcome: Progressing   Problem: Pain Management: Goal: Pain level will decrease with appropriate interventions Outcome: Progressing   Problem: Skin Integrity: Goal: Will show signs of  wound healing Outcome: Progressing   Problem: Skin Integrity: Goal: Will show signs of wound healing Outcome: Progressing

## 2020-07-14 NOTE — Progress Notes (Signed)
Subjective: 1 Day Post-Op Procedure(s) (LRB): TOTAL KNEE ARTHROPLASTY (Left) Patient reports pain as mild.   Patient seen in rounds by Dr. Lequita Halt. Patient is well, and has had no acute complaints or problems other than pain in the left knee. Denies chest pain, SOB, or calf pain. No issues overnight. Foley catheter to be removed this AM. We will continue therapy today, ambulated 40' yesterday.  Objective: Vital signs in last 24 hours: Temp:  [97.4 F (36.3 C)-98 F (36.7 C)] 97.8 F (36.6 C) (10/19 0526) Pulse Rate:  [51-88] 76 (10/19 0526) Resp:  [11-21] 16 (10/19 0526) BP: (91-144)/(49-82) 123/72 (10/19 0526) SpO2:  [99 %-100 %] 100 % (10/19 0526) Weight:  [88.7 kg] 88.7 kg (10/18 0704)  Intake/Output from previous day:  Intake/Output Summary (Last 24 hours) at 07/14/2020 0657 Last data filed at 07/14/2020 0600 Gross per 24 hour  Intake 2937.33 ml  Output 3250 ml  Net -312.67 ml     Intake/Output this shift: Total I/O In: 1781.8 [P.O.:460; I.V.:1121.8; IV Piggyback:200] Out: 2725 [Urine:2725]  Labs: Recent Labs    07/14/20 0324  HGB 11.1*   Recent Labs    07/14/20 0324  WBC 12.2*  RBC 4.08  HCT 36.1  PLT 222   Recent Labs    07/14/20 0324  NA 138  K 4.1  CL 103  CO2 25  BUN 14  CREATININE 0.74  GLUCOSE 131*  CALCIUM 8.7*   No results for input(s): LABPT, INR in the last 72 hours.  Exam: General - Patient is Alert and Oriented Extremity - Neurologically intact Neurovascular intact Sensation intact distally Dorsiflexion/Plantar flexion intact Dressing - dressing C/D/I Motor Function - intact, moving foot and toes well on exam.   Past Medical History:  Diagnosis Date  . Anxiety   . Cataracts, bilateral    md just watching   . Constipation   . Depression   . Elevated LFTs    history of, normal abdominal ultrasound, LFT's normal 01/2007  . Family history of breast cancer   . Family history of kidney cancer   . Family history of  prostate cancer   . Gallbladder problem   . Headache   . History of chest pain    rulre out MI in 12/2006.  Marland Kitchen Hyperlipidemia    diet controlled  . Hypertension   . Lower extremity edema   . Memory loss   . Obesity   . Osteoarthritis of both knees 12/26/2007   Xray of knee 10/2007: Bilateral osteoarthritis, sent to PT but did not complete tx course.    . Osteopenia 07/05/2015  . Pre-diabetes    no meds, diet controlled  . Prediabetes 11/30/2015    Assessment/Plan: 1 Day Post-Op Procedure(s) (LRB): TOTAL KNEE ARTHROPLASTY (Left) Principal Problem:   OA (osteoarthritis) of knee Active Problems:   Primary osteoarthritis of left knee  Estimated body mass index is 38.18 kg/m as calculated from the following:   Height as of this encounter: 5' (1.524 m).   Weight as of this encounter: 88.7 kg. Advance diet Up with therapy D/C IV fluids   Patient's anticipated LOS is less than 2 midnights, meeting these requirements: - Younger than 13 - Lives within 1 hour of care - Has a competent adult at home to recover with post-op recover - NO history of  - Chronic pain requiring opiods  - Diabetes  - Coronary Artery Disease  - Heart failure  - Heart attack  - Stroke  - DVT/VTE  -  Cardiac arrhythmia  - Respiratory Failure/COPD  - Renal failure  - Anemia  - Advanced Liver disease  DVT Prophylaxis - Aspirin Weight bearing as tolerated. Continue therapy.  Plan is to go Home after hospital stay. Plan for discharge later today after two sessions of PT if meeting goals. Scheduled for outpatient physical therapy at [facility]. Follow-up in the office in 2 weeks.   The PDMP database was reviewed today (07/14/2020) prior to any opioid medications being prescribed to this patient.   Arther Abbott, PA-C Orthopedic Surgery [phone] 07/14/2020, 6:57 AM

## 2020-07-14 NOTE — Progress Notes (Signed)
Physical Therapy Treatment Patient Details Name: Darlene [name] MRN: [id number] DOB: May 26, 1951 Today's Date: 07/14/2020    History of Present Illness s/p L TKA    PT Comments    Pt not feeling well, gait distance limited by nausea despite meds. Pain elevated and requiring incr assist for transfers this am. Will see again in pm.  Discussed with RN   Follow Up Recommendations  Follow surgeon's recommendation for DC plan and follow-up therapies;Supervision for mobility/OOB     Equipment Recommendations  None recommended by PT    Recommendations for Other Services       Precautions / Restrictions Precautions Precautions: Fall;Knee Required Braces or Orthoses: Knee Immobilizer - Left Knee Immobilizer - Left: Discontinue once straight leg raise with < 10 degree lag Restrictions Weight Bearing Restrictions: No    Mobility  Bed Mobility               General bed mobility comments: in recliner   Transfers Overall transfer level: Needs assistance Equipment used: Rolling walker (2 wheeled) Transfers: Sit to/from Stand Sit to Stand: Mod assist         General transfer comment: assist to rise and transiton to RW, cues for hand placement  Ambulation/Gait Ambulation/Gait assistance: Min assist Gait Distance (Feet): 8 Feet Assistive device: Rolling walker (2 wheeled) Gait Pattern/deviations: Step-to pattern;Decreased stance time - left     General Gait Details: cues for sequence and RW position   Stairs             Wheelchair Mobility    Modified Rankin (Stroke Patients Only)       Balance                                            Cognition Arousal/Alertness: Awake/alert Behavior During Therapy: WFL for tasks assessed/performed Overall Cognitive Status: Within Functional Limits for tasks assessed                                        Exercises Total Joint Exercises Ankle Circles/Pumps: AROM;Both;10  reps Quad Sets: 5 reps;Both;AROM Straight Leg Raises: AAROM;AROM;5 reps    General Comments        Pertinent Vitals/Pain Pain Assessment: 0-10 Pain Score: 7  Pain Location: L knee Pain Descriptors / Indicators: Aching;Grimacing Pain Intervention(s): Limited activity within patient's tolerance;Monitored during session;Premedicated before session;Repositioned    Home Living                      Prior Function            PT Goals (current goals can now be found in the care plan section) Acute Rehab PT Goals Patient Stated Goal: back to independence PT Goal Formulation: With patient Time For Goal Achievement: 07/20/20 Progress towards PT goals: Progressing toward goals    Frequency    7X/week      PT Plan Current plan remains appropriate    Co-evaluation              AM-PAC PT "6 Clicks" Mobility   Outcome Measure  Help needed turning from your back to your side while in a flat bed without using bedrails?: A Little Help needed moving from lying on your back to sitting on the side of a flat  bed without using bedrails?: A Little Help needed moving to and from a bed to a chair (including a wheelchair)?: A Lot Help needed standing up from a chair using your arms (e.g., wheelchair or bedside chair)?: A Lot Help needed to walk in hospital room?: A Little Help needed climbing 3-5 steps with a railing? : A Lot 6 Click Score: 15    End of Session Equipment Utilized During Treatment: Gait belt;Left knee immobilizer Activity Tolerance: Patient limited by fatigue;Other (comment) (Nausea ) Patient left: in chair;with call bell/phone within reach;with chair alarm set Nurse Communication: Mobility status PT Visit Diagnosis: Difficulty in walking, not elsewhere classified (R26.2)     Time: 1010-1031 PT Time Calculation (min) (ACUTE ONLY): 21 min  Charges:  $Gait Training: 8-22 mins                     Delice Bison, PT  Acute Rehab Dept ([facility])  [phone] Pager [phone]  07/14/2020    [facility] 07/14/2020, 11:20 AM

## 2020-07-15 DIAGNOSIS — M1712 Unilateral primary osteoarthritis, left knee: Secondary | ICD-10-CM | POA: Diagnosis not present

## 2020-07-15 LAB — CBC
HCT: 31.2 % — ABNORMAL LOW (ref 36.0–46.0)
Hemoglobin: 9.7 g/dL — ABNORMAL LOW (ref 12.0–15.0)
MCH: 27.7 pg (ref 26.0–34.0)
MCHC: 31.1 g/dL (ref 30.0–36.0)
MCV: 89.1 fL (ref 80.0–100.0)
Platelets: 243 10*3/uL (ref 150–400)
RBC: 3.5 MIL/uL — ABNORMAL LOW (ref 3.87–5.11)
RDW: 13.2 % (ref 11.5–15.5)
WBC: 10.1 10*3/uL (ref 4.0–10.5)
nRBC: 0 % (ref 0.0–0.2)

## 2020-07-15 LAB — BASIC METABOLIC PANEL
Anion gap: 9 (ref 5–15)
Anion gap: 8 (ref 5–15)
BUN: 8 mg/dL (ref 8–23)
BUN: 8 mg/dL (ref 8–23)
CO2: 19 mmol/L — ABNORMAL LOW (ref 22–32)
CO2: 22 mmol/L (ref 22–32)
Calcium: 6.5 mg/dL — ABNORMAL LOW (ref 8.9–10.3)
Calcium: 7.6 mg/dL — ABNORMAL LOW (ref 8.9–10.3)
Chloride: 112 mmol/L — ABNORMAL HIGH (ref 98–111)
Chloride: 110 mmol/L (ref 98–111)
Creatinine, Ser: 0.46 mg/dL (ref 0.44–1.00)
Creatinine, Ser: 0.61 mg/dL (ref 0.44–1.00)
GFR, Estimated: >60 mL/min (ref >60)
GFR, Estimated: >60 mL/min (ref >60)
Glucose, Bld: 92 mg/dL (ref 70–99)
Glucose, Bld: 100 mg/dL — ABNORMAL HIGH (ref 70–99)
Potassium: 2.7 mmol/L — CL (ref 3.5–5.1)
Potassium: 4.2 mmol/L (ref 3.5–5.1)
Sodium: 140 mmol/L (ref 135–145)
Sodium: 140 mmol/L (ref 135–145)

## 2020-07-15 LAB — GLUCOSE, CAPILLARY
Glucose-Capillary: 95 mg/dL (ref 70–99)
Glucose-Capillary: 97 mg/dL (ref 70–99)
Glucose-Capillary: 119 mg/dL — ABNORMAL HIGH (ref 70–99)
Glucose-Capillary: 89 mg/dL (ref 70–99)

## 2020-07-15 MED ORDER — ACETAMINOPHEN 500 MG PO TABS
500.0000 mg | ORAL_TABLET | Freq: Four times a day (QID) | ORAL | Status: DC | PRN
  Administered 2020-07-16 – 2020-07-17 (×2): 1000 mg via ORAL
  Filled 2020-07-15 (×2): qty 2

## 2020-07-15 MED ORDER — HYDROCODONE-ACETAMINOPHEN 5-325 MG PO TABS
1.0000 | ORAL_TABLET | ORAL | Status: DC | PRN
  Administered 2020-07-15: 2 via ORAL
  Filled 2020-07-15: qty 2

## 2020-07-15 MED ORDER — POTASSIUM CHLORIDE CRYS ER 20 MEQ PO TBCR
40.0000 meq | EXTENDED_RELEASE_TABLET | ORAL | Status: AC
  Administered 2020-07-15 (×3): 40 meq via ORAL
  Filled 2020-07-15 (×3): qty 2

## 2020-07-15 MED ORDER — HYDROMORPHONE HCL 2 MG PO TABS
2.0000 mg | ORAL_TABLET | ORAL | Status: DC | PRN
  Administered 2020-07-17: 2 mg via ORAL
  Filled 2020-07-15: qty 1

## 2020-07-15 MED ORDER — HYDROCHLOROTHIAZIDE 25 MG PO TABS
12.5000 mg | ORAL_TABLET | Freq: Every day | ORAL | Status: DC
  Administered 2020-07-16 – 2020-07-17 (×2): 12.5 mg via ORAL
  Filled 2020-07-15 (×3): qty 1

## 2020-07-15 NOTE — Progress Notes (Signed)
Orthopedic Tech Progress Note Patient Details:  Darlene [name] [birth year] [id number]  Patient ID: Darlene [name], female   DOB: 02/17/1951, 69 y.o.   MRN: [id number]   Darlene [name] 07/15/2020, 12:44 PMrefused CPM, patient said she was not feeling well.

## 2020-07-15 NOTE — Progress Notes (Signed)
° °  Subjective: 2 Days Post-Op Procedure(s) (LRB): TOTAL KNEE ARTHROPLASTY (Left) Patient reports pain as mild.   Patient seen in rounds for Dr. Lequita Halt. Patient is complaining of mild dizziness this AM. Blood pressure stable, voiding without difficulty last night. Had an assisted fall last night. Resting comfortably in the recliner this AM. Will order a 250 mL bolus this AM, encouraged to push fluids.  Plan is to go Home after hospital stay.  Objective: Vital signs in last 24 hours: Temp:  [97.8 F (36.6 C)-99.3 F (37.4 C)] 97.8 F (36.6 C) (10/20 0621) Pulse Rate:  [70-88] 86 (10/20 0621) Resp:  [17-20] 17 (10/20 0621) BP: (126-157)/(69-77) 126/75 (10/20 0621) SpO2:  [98 %-100 %] 100 % (10/20 0621)  Intake/Output from previous day:  Intake/Output Summary (Last 24 hours) at 07/15/2020 0742 Last data filed at 07/15/2020 0600 Gross per 24 hour  Intake 3215.03 ml  Output 1700 ml  Net 1515.03 ml    Intake/Output this shift: No intake/output data recorded.  Labs: Recent Labs    07/14/20 0324 07/15/20 0329  HGB 11.1* 9.7*   Recent Labs    07/14/20 0324 07/15/20 0329  WBC 12.2* 10.1  RBC 4.08 3.50*  HCT 36.1 31.2*  PLT 222 243   Recent Labs    07/14/20 0324 07/15/20 0329  NA 138 140  K 4.1 2.7*  CL 103 112*  CO2 25 19*  BUN 14 8  CREATININE 0.74 0.46  GLUCOSE 131* 92  CALCIUM 8.7* 6.5*   No results for input(s): LABPT, INR in the last 72 hours.  Exam: General - Patient is Alert and Oriented Extremity - Neurologically intact Neurovascular intact Sensation intact distally Dorsiflexion/Plantar flexion intact Dressing/Incision - clean, dry, no drainage Motor Function - intact, moving foot and toes well on exam.   Past Medical History:  Diagnosis Date   Anxiety    Cataracts, bilateral    md just watching    Constipation    Depression    Elevated LFTs    history of, normal abdominal ultrasound, LFT's normal 01/2007   Family history of breast  cancer    Family history of kidney cancer    Family history of prostate cancer    Gallbladder problem    Headache    History of chest pain    rulre out MI in 12/2006.   Hyperlipidemia    diet controlled   Hypertension    Lower extremity edema    Memory loss    Obesity    Osteoarthritis of both knees 12/26/2007   Xray of knee 10/2007: Bilateral osteoarthritis, sent to PT but did not complete tx course.     Osteopenia 07/05/2015   Pre-diabetes    no meds, diet controlled   Prediabetes 11/30/2015    Assessment/Plan: 2 Days Post-Op Procedure(s) (LRB): TOTAL KNEE ARTHROPLASTY (Left) Principal Problem:   OA (osteoarthritis) of knee Active Problems:   Primary osteoarthritis of left knee  Estimated body mass index is 38.18 kg/m as calculated from the following:   Height as of this encounter: 5' (1.524 m).   Weight as of this encounter: 88.7 kg. Up with therapy  DVT Prophylaxis - Aspirin Weight-bearing as tolerated  Potassium 2.7 this AM. 40 mEq KCl Q2 x 3 doses ordered with repeat BMP at 1300. Will hold HCTZ this AM.  Arther Abbott, PA-C Orthopedic Surgery [phone] 07/15/2020, 7:42 AM

## 2020-07-15 NOTE — Progress Notes (Signed)
CRITICAL VALUE ALERT  Critical Value:  Potassium 2.7  Date & Time Notied:  07/15/20 04:30am  Provider Notified: Dr. Lequita Halt  Orders Received/Actions taken: See new orders

## 2020-07-15 NOTE — Progress Notes (Signed)
Physical Therapy Treatment Patient Details Name: Darlene [name] MRN: [id number] DOB: 08-18-1951 Today's Date: 07/15/2020    History of Present Illness s/p L TKA    PT Comments    On arrival, pt in recliner and reports mild dizziness and headache.  BP obtained at rest and 147/46mmHg.  Pt encouraged to mobilize as tolerated however only able to ambulate 8 feet.  Pt reports increased knee pain and dizziness.  BP obtained once pt safely in recliner and 164/41mmHg and Spo2 100% on room air.  RN notified.    Follow Up Recommendations  Follow surgeons recommendation for DC plan and follow-up therapies;Supervision for mobility/OOB     Equipment Recommendations  None recommended by PT    Recommendations for Other Services       Precautions / Restrictions Precautions Precautions: Fall;Knee Required Braces or Orthoses: Knee Immobilizer - Left Knee Immobilizer - Left: Discontinue once straight leg raise with < 10 degree lag    Mobility  Bed Mobility               General bed mobility comments: pt in recliner  Transfers Overall transfer level: Needs assistance Equipment used: Rolling walker (2 wheeled) Transfers: Sit to/from Stand           General transfer comment: assist to rise and steady, increased time and effort, limited by pain  Ambulation/Gait Ambulation/Gait assistance: Min assist Gait Distance (Feet): 8 Feet Assistive device: Rolling walker (2 wheeled) Gait Pattern/deviations: Step-to pattern;Decreased stance time - left;Antalgic     General Gait Details: verbal cues for RW Positioning, sequence; pt limited distance due to pain and dizziness   Stairs             Wheelchair Mobility    Modified Rankin (Stroke Patients Only)       Balance                                            Cognition Arousal/Alertness: Awake/alert Behavior During Therapy: Flat affect Overall Cognitive Status: Within Functional Limits for  tasks assessed                                        Exercises      General Comments        Pertinent Vitals/Pain Pain Assessment: 0-10 Pain Score: 7  Pain Location: L knee Pain Descriptors / Indicators: Aching;Grimacing;Sore Pain Intervention(s): Monitored during session;Repositioned;Ice applied    Home Living                      Prior Function            PT Goals (current goals can now be found in the care plan section)      Frequency    7X/week      PT Plan Current plan remains appropriate    Co-evaluation              AM-PAC PT "6 Clicks" Mobility   Outcome Measure  Help needed turning from your back to your side while in a flat bed without using bedrails?: A Little Help needed moving from lying on your back to sitting on the side of a flat bed without using bedrails?: A Little Help needed moving to and from a bed to  a chair (including a wheelchair)?: A Little Help needed standing up from a chair using your arms (e.g., wheelchair or bedside chair)?: A Little Help needed to walk in hospital room?: A Little Help needed climbing 3-5 steps with a railing? : A Lot 6 Click Score: 17    End of Session Equipment Utilized During Treatment: Gait belt;Left knee immobilizer Activity Tolerance: Patient limited by pain Patient left: in chair;with call bell/phone within reach;with chair alarm set Nurse Communication: Mobility status PT Visit Diagnosis: Difficulty in walking, not elsewhere classified (R26.2)     Time: 1107-1130 PT Time Calculation (min) (ACUTE ONLY): 23 min  Charges:  $Gait Training: 23-37 mins                     Thomasene Mohair PT, DPT Acute Rehabilitation Services Pager: [phone] Office: [phone]  Sarajane Jews 07/15/2020, 12:37 PM

## 2020-07-15 NOTE — Progress Notes (Signed)
Physical Therapy Treatment Patient Details Name: Darlene [name] MRN: [id number] DOB: 01-28-1951 Today's Date: 07/15/2020    History of Present Illness s/p L TKA    PT Comments    Pt reports she remains dizzy even at rest however worse with mobility.  Pt believes this is from her pain meds (RN notified).  Pt requested assist back to bed and performed LE exercises in supine.     Follow Up Recommendations  Follow surgeon's recommendation for DC plan and follow-up therapies;Supervision for mobility/OOB     Equipment Recommendations  None recommended by PT    Recommendations for Other Services       Precautions / Restrictions Precautions Precautions: Fall;Knee Required Braces or Orthoses: Knee Immobilizer - Left Knee Immobilizer - Left: Discontinue once straight leg raise with < 10 degree lag Restrictions Weight Bearing Restrictions: No    Mobility  Bed Mobility Overal bed mobility: Needs Assistance Bed Mobility: Sit to Supine       Sit to supine: Mod assist   General bed mobility comments: assist for LEs into bed  Transfers Overall transfer level: Needs assistance Equipment used: Rolling walker (2 wheeled) Transfers: Sit to/from Stand Sit to Stand: Min guard         General transfer comment: verbal cues for UE and LE positioning, increased time and effort, limited by pain  Ambulation/Gait      General Gait Details: pt declined   Stairs             Wheelchair Mobility    Modified Rankin (Stroke Patients Only)       Balance                                            Cognition Arousal/Alertness: Awake/alert Behavior During Therapy: Flat affect Overall Cognitive Status: Within Functional Limits for tasks assessed                                        Exercises Total Joint Exercises Ankle Circles/Pumps: AROM;Both;10 reps Quad Sets: Both;AROM;10 reps Heel Slides: AAROM;Left;10 reps Hip  ABduction/ADduction: AAROM;Left;10 reps Straight Leg Raises: AAROM;10 reps;Left    General Comments        Pertinent Vitals/Pain Pain Assessment: 0-10 Pain Score: 6  Pain Location: L knee Pain Descriptors / Indicators: Aching;Grimacing;Sore Pain Intervention(s): Monitored during session;Repositioned;Ice applied    Home Living                      Prior Function            PT Goals (current goals can now be found in the care plan section) Progress towards PT goals: Progressing toward goals    Frequency    7X/week      PT Plan Current plan remains appropriate    Co-evaluation              AM-PAC PT "6 Clicks" Mobility   Outcome Measure  Help needed turning from your back to your side while in a flat bed without using bedrails?: A Little Help needed moving from lying on your back to sitting on the side of a flat bed without using bedrails?: A Little Help needed moving to and from a bed to a chair (including a wheelchair)?: A Little Help  needed standing up from a chair using your arms (e.g., wheelchair or bedside chair)?: A Little Help needed to walk in hospital room?: A Little Help needed climbing 3-5 steps with a railing? : A Lot 6 Click Score: 17    End of Session Equipment Utilized During Treatment: Gait belt;Left knee immobilizer Activity Tolerance: Patient limited by pain Patient left: with call bell/phone within reach;in bed;with bed alarm set Nurse Communication: Mobility status PT Visit Diagnosis: Difficulty in walking, not elsewhere classified (R26.2)     Time: 2500-3704 PT Time Calculation (min) (ACUTE ONLY): 20 min  Charges:   $Therapeutic Exercise: 8-22 mins                    Paulino Door, DPT Acute Rehabilitation Services Pager: [phone] Office: [phone]  Sarajane Jews 07/15/2020, 3:43 PM

## 2020-07-16 DIAGNOSIS — M1712 Unilateral primary osteoarthritis, left knee: Secondary | ICD-10-CM | POA: Diagnosis not present

## 2020-07-16 LAB — CBC
HCT: 34.1 % — ABNORMAL LOW (ref 36.0–46.0)
Hemoglobin: 10.7 g/dL — ABNORMAL LOW (ref 12.0–15.0)
MCH: 27.9 pg (ref 26.0–34.0)
MCHC: 31.4 g/dL (ref 30.0–36.0)
MCV: 88.8 fL (ref 80.0–100.0)
Platelets: 297 10*3/uL (ref 150–400)
RBC: 3.84 MIL/uL — ABNORMAL LOW (ref 3.87–5.11)
RDW: 13.2 % (ref 11.5–15.5)
WBC: 11.6 10*3/uL — ABNORMAL HIGH (ref 4.0–10.5)
nRBC: 0 % (ref 0.0–0.2)

## 2020-07-16 LAB — GLUCOSE, CAPILLARY
Glucose-Capillary: 86 mg/dL (ref 70–99)
Glucose-Capillary: 88 mg/dL (ref 70–99)
Glucose-Capillary: 83 mg/dL (ref 70–99)
Glucose-Capillary: 97 mg/dL (ref 70–99)

## 2020-07-16 NOTE — Progress Notes (Signed)
Physical Therapy Treatment Patient Details Name: Darlene [name] MRN: [id number] DOB: 1951/01/25 Today's Date: 07/16/2020    History of Present Illness s/p L TKA    PT Comments    Pt reports fatigue however encouraged to ambulate again prior to d/c as she will be getting up throughout the day at home.  Pt only able to tolerate 8 feet.  Pt reports she feels her friend can assist her.  Encouraged pt to have her friend assist her with mobility initially upon return home for safety.  Pt has not had any visitors (from what therapist has gathered) and therapist apprehensive about her ability to manage at home.  Pt however, does feel she can manage at home with her friend's help.  Discussed with RN.  Pt likely to d/c home today.   Follow Up Recommendations  Follow surgeon's recommendation for DC plan and follow-up therapies;Supervision for mobility/OOB     Equipment Recommendations  None recommended by PT    Recommendations for Other Services       Precautions / Restrictions Precautions Precautions: Fall;Knee Required Braces or Orthoses: Knee Immobilizer - Left Knee Immobilizer - Left: Discontinue once straight leg raise with < 10 degree lag Restrictions Weight Bearing Restrictions: No    Mobility  Bed Mobility               General bed mobility comments: pt in recliner  Transfers Overall transfer level: Needs assistance Equipment used: Rolling walker (2 wheeled) Transfers: Sit to/from Stand Sit to Stand: Min guard;Min assist         General transfer comment: verbal cues for UE and LE positioning, increased time and effort, limited by pain (assisted slightly with positioning of L LE for pain control)  Ambulation/Gait Ambulation/Gait assistance: Min guard Gait Distance (Feet): 8 Feet Assistive device: Rolling walker (2 wheeled) Gait Pattern/deviations: Step-to pattern;Decreased stance time - left;Antalgic     General Gait Details: limited distance due to  fatigue and mild dizziness   Stairs             Wheelchair Mobility    Modified Rankin (Stroke Patients Only)       Balance                                            Cognition Arousal/Alertness: Awake/alert Behavior During Therapy: Flat affect Overall Cognitive Status: Within Functional Limits for tasks assessed                                        Exercises      General Comments        Pertinent Vitals/Pain Pain Assessment: 0-10 Pain Score: 5  Pain Location: L knee Pain Descriptors / Indicators: Aching;Grimacing;Sore Pain Intervention(s): Repositioned;Monitored during session    Home Living                      Prior Function            PT Goals (current goals can now be found in the care plan section) Progress towards PT goals: Progressing toward goals    Frequency    7X/week      PT Plan Current plan remains appropriate    Co-evaluation  AM-PAC PT "6 Clicks" Mobility   Outcome Measure  Help needed turning from your back to your side while in a flat bed without using bedrails?: A Little Help needed moving from lying on your back to sitting on the side of a flat bed without using bedrails?: A Little Help needed moving to and from a bed to a chair (including a wheelchair)?: A Little Help needed standing up from a chair using your arms (e.g., wheelchair or bedside chair)?: A Little Help needed to walk in hospital room?: A Little Help needed climbing 3-5 steps with a railing? : A Lot 6 Click Score: 17    End of Session Equipment Utilized During Treatment: Gait belt;Left knee immobilizer Activity Tolerance: Patient limited by fatigue Patient left: in chair;with call bell/phone within reach Nurse Communication: Mobility status PT Visit Diagnosis: Difficulty in walking, not elsewhere classified (R26.2)     Time: 5038-8828 PT Time Calculation (min) (ACUTE ONLY): 20  min  Charges:  $Gait Training: 8-22 mins                     Paulino Door, DPT Acute Rehabilitation Services Pager: [phone] Office: [phone]   Sarajane Jews 07/16/2020, 4:25 PM

## 2020-07-16 NOTE — Plan of Care (Signed)
  Problem: Education: Goal: Knowledge of General Education information will improve Description: Including pain rating scale, medication(s)/side effects and non-pharmacologic comfort measures Outcome: Progressing   Problem: Health Behavior/Discharge Planning: Goal: Ability to manage health-related needs will improve Outcome: Progressing   Problem: Health Behavior/Discharge Planning: Goal: Ability to manage health-related needs will improve Outcome: Progressing   Problem: Activity: Goal: Risk for activity intolerance will decrease Outcome: Progressing   Problem: Nutrition: Goal: Adequate nutrition will be maintained Outcome: Progressing   Problem: Coping: Goal: Level of anxiety will decrease Outcome: Progressing   Problem: Elimination: Goal: Will not experience complications related to bowel motility Outcome: Progressing Goal: Will not experience complications related to urinary retention Outcome: Progressing   Problem: Pain Managment: Goal: General experience of comfort will improve Outcome: Progressing   Problem: Safety: Goal: Ability to remain free from injury will improve Outcome: Progressing   Problem: Skin Integrity: Goal: Risk for impaired skin integrity will decrease Outcome: Progressing   Problem: Education: Goal: Knowledge of the prescribed therapeutic regimen will improve Outcome: Progressing Goal: Individualized Educational Video(s) Outcome: Progressing   Problem: Activity: Goal: Ability to avoid complications of mobility impairment will improve Outcome: Progressing Goal: Range of joint motion will improve Outcome: Progressing   Problem: Clinical Measurements: Goal: Postoperative complications will be avoided or minimized Outcome: Progressing   Problem: Pain Management: Goal: Pain level will decrease with appropriate interventions Outcome: Progressing   Problem: Skin Integrity: Goal: Will show signs of wound healing Outcome: Progressing

## 2020-07-16 NOTE — Plan of Care (Signed)

## 2020-07-16 NOTE — Progress Notes (Signed)
   Subjective: 3 Days Post-Op Procedure(s) (LRB): TOTAL KNEE ARTHROPLASTY (Left) Patient reports pain as mild.   Patient seen in rounds with Dr. Lequita Halt. Patient is feeling better this AM, reports dizziness has improved. Discontinued both oxycodone and gabapentin yesterday. Added dilaudid in for severe pain, did not require this overnight. Denies chest pain, SOB, or calf pain. Plan is to go Home after hospital stay.  Objective: Vital signs in last 24 hours: Temp:  [97.5 F (36.4 C)-98.8 F (37.1 C)] 98.1 F (36.7 C) (10/21 0556) Pulse Rate:  [68-105] 96 (10/21 0556) Resp:  [14-19] 18 (10/21 0556) BP: (130-166)/(68-79) 130/73 (10/21 0556) SpO2:  [98 %-100 %] 100 % (10/21 0556)  Intake/Output from previous day:  Intake/Output Summary (Last 24 hours) at 07/16/2020 0742 Last data filed at 07/16/2020 0600 Gross per 24 hour  Intake 1433.45 ml  Output 650 ml  Net 783.45 ml    Intake/Output this shift: No intake/output data recorded.  Labs: Recent Labs    07/14/20 0324 07/15/20 0329 07/16/20 0312  HGB 11.1* 9.7* 10.7*   Recent Labs    07/15/20 0329 07/16/20 0312  WBC 10.1 11.6*  RBC 3.50* 3.84*  HCT 31.2* 34.1*  PLT 243 297   Recent Labs    07/15/20 0329 07/15/20 1349  NA 140 140  K 2.7* 4.2  CL 112* 110  CO2 19* 22  BUN 8 8  CREATININE 0.46 0.61  GLUCOSE 92 100*  CALCIUM 6.5* 7.6*   No results for input(s): LABPT, INR in the last 72 hours.  Exam: General - Patient is Alert and Oriented Extremity - Neurologically intact Neurovascular intact Sensation intact distally Dorsiflexion/Plantar flexion intact Dressing/Incision - clean, dry, no drainage Motor Function - intact, moving foot and toes well on exam.   Past Medical History:  Diagnosis Date  . Anxiety   . Cataracts, bilateral    md just watching   . Constipation   . Depression   . Elevated LFTs    history of, normal abdominal ultrasound, LFT's normal 01/2007  . Family history of breast cancer    . Family history of kidney cancer   . Family history of prostate cancer   . Gallbladder problem   . Headache   . History of chest pain    rulre out MI in 12/2006.  Marland Kitchen Hyperlipidemia    diet controlled  . Hypertension   . Lower extremity edema   . Memory loss   . Obesity   . Osteoarthritis of both knees 12/26/2007   Xray of knee 10/2007: Bilateral osteoarthritis, sent to PT but did not complete tx course.    . Osteopenia 07/05/2015  . Pre-diabetes    no meds, diet controlled  . Prediabetes 11/30/2015    Assessment/Plan: 3 Days Post-Op Procedure(s) (LRB): TOTAL KNEE ARTHROPLASTY (Left) Principal Problem:   OA (osteoarthritis) of knee Active Problems:   Primary osteoarthritis of left knee  Estimated body mass index is 38.18 kg/m as calculated from the following:   Height as of this encounter: 5' (1.524 m).   Weight as of this encounter: 88.7 kg. Up with therapy  DVT Prophylaxis - Aspirin Weight-bearing as tolerated  Plan for discharge today if cleared by PT and feeling well. Will reschedule first OPPT visit to Monday. Follow-up in the office in 2 weeks.   Arther Abbott, PA-C Orthopedic Surgery [phone] 07/16/2020, 7:42 AM

## 2020-07-16 NOTE — Progress Notes (Signed)
Physical Therapy Treatment Patient Details Name: Darlene [name] MRN: [id number] DOB: Feb 10, 1951 Today's Date: 07/16/2020    History of Present Illness s/p L TKA    PT Comments    Pt assisted with ambulating however only able to tolerate 14 feet.  Pt educated on use of KI.  Pt reports her friend will assist her at home however she will be in/out.  Pt would another session prior to d/c.    Follow Up Recommendations  Follow surgeons recommendation for DC plan and follow-up therapies;Supervision for mobility/OOB     Equipment Recommendations  None recommended by PT    Recommendations for Other Services       Precautions / Restrictions Precautions Precautions: Fall;Knee Required Braces or Orthoses: Knee Immobilizer - Left Knee Immobilizer - Left: Discontinue once straight leg raise with < 10 degree lag Restrictions Weight Bearing Restrictions: No    Mobility  Bed Mobility               General bed mobility comments: pt in recliner  Transfers Overall transfer level: Needs assistance Equipment used: Rolling walker (2 wheeled) Transfers: Sit to/from Stand Sit to Stand: Min guard         General transfer comment: verbal cues for UE and LE positioning, increased time and effort, limited by pain  Ambulation/Gait Ambulation/Gait assistance: Min guard Gait Distance (Feet): 14 Feet Assistive device: Rolling walker (2 wheeled) Gait Pattern/deviations: Step-to pattern;Decreased stance time - left;Antalgic     General Gait Details: limited distance due to fatigue and mild dizziness   Stairs             Wheelchair Mobility    Modified Rankin (Stroke Patients Only)       Balance                                            Cognition Arousal/Alertness: Awake/alert Behavior During Therapy: Flat affect Overall Cognitive Status: Within Functional Limits for tasks assessed                                         Exercises      General Comments        Pertinent Vitals/Pain Pain Assessment: 0-10 Pain Score: 5  Pain Location: L knee Pain Descriptors / Indicators: Aching;Grimacing;Sore Pain Intervention(s): Monitored during session;Repositioned;Ice applied    Home Living                      Prior Function            PT Goals (current goals can now be found in the care plan section) Progress towards PT goals: Progressing toward goals    Frequency    7X/week      PT Plan Current plan remains appropriate    Co-evaluation              AM-PAC PT "6 Clicks" Mobility   Outcome Measure  Help needed turning from your back to your side while in a flat bed without using bedrails?: A Little Help needed moving from lying on your back to sitting on the side of a flat bed without using bedrails?: A Little Help needed moving to and from a bed to a chair (including a wheelchair)?: A Little Help needed  standing up from a chair using your arms (e.g., wheelchair or bedside chair)?: A Little Help needed to walk in hospital room?: A Little Help needed climbing 3-5 steps with a railing? : A Lot 6 Click Score: 17    End of Session Equipment Utilized During Treatment: Gait belt;Left knee immobilizer Activity Tolerance: Patient limited by pain Patient left: with call bell/phone within reach;in chair;with chair alarm set Nurse Communication: Mobility status PT Visit Diagnosis: Difficulty in walking, not elsewhere classified (R26.2)     Time: 7414-2395 PT Time Calculation (min) (ACUTE ONLY): 14 min  Charges:  $Gait Training: 8-22 mins                    Paulino Door, DPT Acute Rehabilitation Services Pager: [phone] Office: [phone]  Maida Sale E 07/16/2020, 4:20 PM

## 2020-07-17 DIAGNOSIS — M1712 Unilateral primary osteoarthritis, left knee: Secondary | ICD-10-CM | POA: Diagnosis not present

## 2020-07-17 LAB — GLUCOSE, CAPILLARY: Glucose-Capillary: 94 mg/dL (ref 70–99)

## 2020-07-17 NOTE — Progress Notes (Signed)
   Subjective: 4 Days Post-Op Procedure(s) (LRB): TOTAL KNEE ARTHROPLASTY (Left) Patient reports pain as mild.   Patient seen in rounds by Dr. Lequita Halt. Patient is well, and has had no acute complaints or problems. States she feels much better this AM. Denies chest pain or SOB. No issues overnight. Voiding without difficulty. Plan is to go Home after hospital stay.  Objective: Vital signs in last 24 hours: Temp:  [97.8 F (36.6 C)-98.9 F (37.2 C)] 98.9 F (37.2 C) (10/21 2053) Pulse Rate:  [72-95] 95 (10/21 2053) Resp:  [17-18] 17 (10/21 2053) BP: (138-144)/(72-87) 138/72 (10/21 2053) SpO2:  [99 %-100 %] 100 % (10/21 2053)  Intake/Output from previous day:  Intake/Output Summary (Last 24 hours) at 07/17/2020 0658 Last data filed at 07/17/2020 0200 Gross per 24 hour  Intake 720 ml  Output 1350 ml  Net -630 ml    Intake/Output this shift: Total I/O In: 240 [P.O.:240] Out: 750 [Urine:750]  Labs: Recent Labs    07/15/20 0329 07/16/20 0312  HGB 9.7* 10.7*   Recent Labs    07/15/20 0329 07/16/20 0312  WBC 10.1 11.6*  RBC 3.50* 3.84*  HCT 31.2* 34.1*  PLT 243 297   Recent Labs    07/15/20 0329 07/15/20 1349  NA 140 140  K 2.7* 4.2  CL 112* 110  CO2 19* 22  BUN 8 8  CREATININE 0.46 0.61  GLUCOSE 92 100*  CALCIUM 6.5* 7.6*   No results for input(s): LABPT, INR in the last 72 hours.  Exam: General - Patient is Alert and Oriented Extremity - Neurologically intact Neurovascular intact Sensation intact distally Dorsiflexion/Plantar flexion intact Dressing/Incision - clean, dry, no drainage Motor Function - intact, moving foot and toes well on exam.   Past Medical History:  Diagnosis Date  . Anxiety   . Cataracts, bilateral    md just watching   . Constipation   . Depression   . Elevated LFTs    history of, normal abdominal ultrasound, LFT's normal 01/2007  . Family history of breast cancer   . Family history of kidney cancer   . Family history of  prostate cancer   . Gallbladder problem   . Headache   . History of chest pain    rulre out MI in 12/2006.  Marland Kitchen Hyperlipidemia    diet controlled  . Hypertension   . Lower extremity edema   . Memory loss   . Obesity   . Osteoarthritis of both knees 12/26/2007   Xray of knee 10/2007: Bilateral osteoarthritis, sent to PT but did not complete tx course.    . Osteopenia 07/05/2015  . Pre-diabetes    no meds, diet controlled  . Prediabetes 11/30/2015    Assessment/Plan: 4 Days Post-Op Procedure(s) (LRB): TOTAL KNEE ARTHROPLASTY (Left) Principal Problem:   OA (osteoarthritis) of knee Active Problems:   Primary osteoarthritis of left knee  Estimated body mass index is 38.18 kg/m as calculated from the following:   Height as of this encounter: 5' (1.524 m).   Weight as of this encounter: 88.7 kg. Up with therapy  DVT Prophylaxis - Aspirin Weight-bearing as tolerated  Discharge today. Will begin outpatient physical therapy on Monday.  Arther Abbott, PA-C Orthopedic Surgery [phone] 07/17/2020, 6:58 AM

## 2020-07-17 NOTE — Plan of Care (Signed)
  Problem: Education: Goal: Knowledge of General Education information will improve Description: Including pain rating scale, medication(s)/side effects and non-pharmacologic comfort measures Outcome: Progressing   Problem: Health Behavior/Discharge Planning: Goal: Ability to manage health-related needs will improve Outcome: Progressing   Problem: Clinical Measurements: Goal: Ability to maintain clinical measurements within normal limits will improve Outcome: Progressing Goal: Will remain free from infection Outcome: Progressing Goal: Diagnostic test results will improve Outcome: Progressing Goal: Respiratory complications will improve Outcome: Progressing Goal: Cardiovascular complication will be avoided Outcome: Progressing   Problem: Activity: Goal: Risk for activity intolerance will decrease Outcome: Progressing   Problem: Nutrition: Goal: Adequate nutrition will be maintained Outcome: Progressing   Problem: Coping: Goal: Level of anxiety will decrease Outcome: Progressing   Problem: Elimination: Goal: Will not experience complications related to bowel motility Outcome: Progressing Goal: Will not experience complications related to urinary retention Outcome: Progressing   Problem: Pain Managment: Goal: General experience of comfort will improve Outcome: Progressing   Problem: Skin Integrity: Goal: Risk for impaired skin integrity will decrease Outcome: Progressing   Problem: Safety: Goal: Ability to remain free from injury will improve Outcome: Progressing   Problem: Education: Goal: Knowledge of the prescribed therapeutic regimen will improve Outcome: Progressing Goal: Individualized Educational Video(s) Outcome: Progressing   Problem: Activity: Goal: Ability to avoid complications of mobility impairment will improve Outcome: Progressing Goal: Range of joint motion will improve Outcome: Progressing   Problem: Clinical Measurements: Goal:  Postoperative complications will be avoided or minimized Outcome: Progressing   Problem: Pain Management: Goal: Pain level will decrease with appropriate interventions Outcome: Progressing   Problem: Skin Integrity: Goal: Will show signs of wound healing Outcome: Progressing   

## 2020-07-17 NOTE — Progress Notes (Signed)
Patient being discharged in stable condition. Is excited about going home. Discharge packet and instructions reviewed and given. Minimal pain at this time.

## 2020-07-17 NOTE — Progress Notes (Signed)
Physical Therapy Treatment Patient Details Name: Darlene [name] MRN: [id number] DOB: 12/12/1950 Today's Date: 07/17/2020    History of Present Illness s/p L TKA    PT Comments    Pt progressing well, feeling much better overall today. Ready for d/t with family assist from PT standpoint. RN aware   Follow Up Recommendations  Follow surgeon's recommendation for DC plan and follow-up therapies;Supervision for mobility/OOB     Equipment Recommendations  None recommended by PT    Recommendations for Other Services       Precautions / Restrictions Precautions Precautions: Fall;Knee Required Braces or Orthoses: Knee Immobilizer - Left Knee Immobilizer - Left: Discontinue once straight leg raise with < 10 degree lag Restrictions Weight Bearing Restrictions: No Other Position/Activity Restrictions: WBAT    Mobility  Bed Mobility               General bed mobility comments: pt in recliner  Transfers Overall transfer level: Needs assistance Equipment used: Rolling walker (2 wheeled) Transfers: Sit to/from [employer] Sit to Stand: Supervision Stand pivot transfers: Supervision       General transfer comment: intermittent cues for correct hand placement and LLE positioning, supervision for safety   Ambulation/Gait Ambulation/Gait assistance: Supervision;Min guard Gait Distance (Feet): 60 Feet Assistive device: Rolling walker (2 wheeled) Gait Pattern/deviations: Step-to pattern;Decreased step length - left;Decreased step length - right;Decreased stance time - left Gait velocity: decr   General Gait Details: cues for sequence and RW position, steady gait, incr wt on LLE, no LOB   Stairs             Wheelchair Mobility    Modified Rankin (Stroke Patients Only)       Balance                                            Cognition Arousal/Alertness: Awake/alert Behavior During Therapy: WFL for tasks  assessed/performed Overall Cognitive Status: Within Functional Limits for tasks assessed                                        Exercises Total Joint Exercises Ankle Circles/Pumps: AROM;Both;10 reps Quad Sets: Both;AROM;10 reps Heel Slides: AAROM;Left;10 reps Hip ABduction/ADduction: AAROM;Left;10 reps Straight Leg Raises: AAROM;10 reps;Left Goniometric ROM: grossly 8 to 65 degrees L knee flexion     General Comments        Pertinent Vitals/Pain Pain Assessment: 0-10 Pain Score: 3  Pain Location: L knee Pain Descriptors / Indicators: Grimacing;Sore Pain Intervention(s): Limited activity within patient's tolerance;Monitored during session;Repositioned;Premedicated before session;Ice applied    Home Living                      Prior Function            PT Goals (current goals can now be found in the care plan section) Acute Rehab PT Goals Patient Stated Goal: back to independence PT Goal Formulation: With patient Time For Goal Achievement: 07/20/20 Progress towards PT goals: Progressing toward goals    Frequency    7X/week      PT Plan Current plan remains appropriate    Co-evaluation              AM-PAC PT "6 Clicks" Mobility   Outcome Measure  Help needed turning from your back to your side while in a flat bed without using bedrails?: A Little Help needed moving from lying on your back to sitting on the side of a flat bed without using bedrails?: A Little Help needed moving to and from a bed to a chair (including a wheelchair)?: A Little Help needed standing up from a chair using your arms (e.g., wheelchair or bedside chair)?: A Little Help needed to walk in hospital room?: A Little Help needed climbing 3-5 steps with a railing? : A Little 6 Click Score: 18    End of Session Equipment Utilized During Treatment: Gait belt;Left knee immobilizer Activity Tolerance: Patient tolerated treatment well Patient left: in chair;with  call bell/phone within reach Nurse Communication: Mobility status PT Visit Diagnosis: Difficulty in walking, not elsewhere classified (R26.2)     Time: 9509-3267 PT Time Calculation (min) (ACUTE ONLY): 35 min  Charges:  $Gait Training: 8-22 mins $Therapeutic Exercise: 8-22 mins                     Delice Bison, PT  Acute Rehab Dept ([facility]) [phone] Pager [phone]  07/17/2020    [facility] 07/17/2020, 12:29 PM

## 2020-07-18 ENCOUNTER — Other Ambulatory Visit (INDEPENDENT_AMBULATORY_CARE_PROVIDER_SITE_OTHER): Payer: Self-pay | Admitting: Family Medicine

## 2020-07-18 DIAGNOSIS — E559 Vitamin D deficiency, unspecified: Secondary | ICD-10-CM

## 2020-07-20 ENCOUNTER — Other Ambulatory Visit (INDEPENDENT_AMBULATORY_CARE_PROVIDER_SITE_OTHER): Payer: Self-pay | Admitting: Family Medicine

## 2020-07-20 ENCOUNTER — Other Ambulatory Visit (INDEPENDENT_AMBULATORY_CARE_PROVIDER_SITE_OTHER): Payer: Self-pay

## 2020-07-20 DIAGNOSIS — R7303 Prediabetes: Secondary | ICD-10-CM

## 2020-07-27 DIAGNOSIS — M25562 Pain in left knee: Secondary | ICD-10-CM | POA: Diagnosis not present

## 2020-07-29 DIAGNOSIS — M25562 Pain in left knee: Secondary | ICD-10-CM | POA: Diagnosis not present

## 2020-07-31 DIAGNOSIS — M25562 Pain in left knee: Secondary | ICD-10-CM | POA: Diagnosis not present

## 2020-08-03 DIAGNOSIS — M25562 Pain in left knee: Secondary | ICD-10-CM | POA: Diagnosis not present

## 2020-08-05 DIAGNOSIS — M25562 Pain in left knee: Secondary | ICD-10-CM | POA: Diagnosis not present

## 2020-08-07 DIAGNOSIS — M25562 Pain in left knee: Secondary | ICD-10-CM | POA: Diagnosis not present

## 2020-08-11 ENCOUNTER — Other Ambulatory Visit: Payer: Self-pay

## 2020-08-11 ENCOUNTER — Ambulatory Visit (INDEPENDENT_AMBULATORY_CARE_PROVIDER_SITE_OTHER): Payer: Medicare HMO | Admitting: Family Medicine

## 2020-08-11 ENCOUNTER — Encounter (INDEPENDENT_AMBULATORY_CARE_PROVIDER_SITE_OTHER): Payer: Self-pay | Admitting: Family Medicine

## 2020-08-11 VITALS — BP 110/69 | HR 90 | Temp 97.7°F | Ht 59.0 in | Wt 195.0 lb

## 2020-08-11 DIAGNOSIS — E559 Vitamin D deficiency, unspecified: Secondary | ICD-10-CM

## 2020-08-11 DIAGNOSIS — M25562 Pain in left knee: Secondary | ICD-10-CM | POA: Diagnosis not present

## 2020-08-11 DIAGNOSIS — R7303 Prediabetes: Secondary | ICD-10-CM | POA: Diagnosis not present

## 2020-08-11 DIAGNOSIS — R110 Nausea: Secondary | ICD-10-CM

## 2020-08-11 DIAGNOSIS — Z6839 Body mass index (BMI) 39.0-39.9, adult: Secondary | ICD-10-CM | POA: Diagnosis not present

## 2020-08-11 MED ORDER — OZEMPIC (0.25 OR 0.5 MG/DOSE) 2 MG/1.5ML ~~LOC~~ SOPN: 0.2500 mg | PEN_INJECTOR | SUBCUTANEOUS | 0 refills | Status: DC

## 2020-08-11 MED ORDER — ONDANSETRON 4 MG PO TBDP: 4.0000 mg | ORAL_TABLET | Freq: Three times a day (TID) | ORAL | 0 refills | Status: DC | PRN

## 2020-08-11 MED ORDER — VITAMIN D (ERGOCALCIFEROL) 1.25 MG (50000 UNIT) PO CAPS: 50000.0000 [IU] | ORAL_CAPSULE | ORAL | 0 refills | Status: DC

## 2020-08-13 NOTE — Progress Notes (Signed)
Chief Complaint:   OBESITY Darlene [name] is here to discuss her progress with her obesity treatment plan along with follow-up of her obesity related diagnoses. Darlene [name] is on keeping a food journal and adhering to recommended goals of [phone] calories and 80 grams of protein daily and states she is following her eating plan approximately 90% of the time. Darlene [name] states she is doing physical therapy for 60 minutes 2 times per week.  Today's visit was #: 24 Starting weight: 213 lbs Starting date: 06/04/2019 Today's weight: 195 lbs Today's date: 08/11/2020 Total lbs lost to date: 18 Total lbs lost since last in-office visit: 0  Interim History: Darlene [name] is status post left total knee replacement on 07/13/2020. She is journaling but she is not meeting her calorie or protein goals due to lack of appetite post op..  Subjective:   1. Pre-diabetes Darlene [name] notes lack of appetite post-op. Last A1c was 5.9. She notes nausea and constipation. She would like to continue Ozempic, however.  Lab Results  Component Value Date   HGBA1C 5.9 (H) 06/23/2020   Lab Results  Component Value Date   INSULIN 10.5 06/23/2020   INSULIN 12.2 04/01/2020   INSULIN 17.7 09/17/2019   INSULIN 10.7 06/04/2019   2. Vitamin D deficiency Darlene [name]'s last Vit D level was low at 44.5. She is on weekly prescription Vit D.  3. Nausea Darlene [name] notes intermittent nausea since her surgery. Her food intake is down.  Assessment/Plan:   1. Pre-diabetes We will refill Ozempic for 1 month.  - Semaglutide,0.25 or 0.5MG /DOS, (OZEMPIC, 0.25 OR 0.5 MG/DOSE,) 2 MG/1.5ML SOPN; Inject 0.25 mg into the skin once a week.  Dispense: 1.5 mL; Refill: 0  2. Vitamin D deficiency  We will refill prescription Vitamin D for 1 month.   - Vitamin D, Ergocalciferol, (DRISDOL) 1.25 MG (50000 UNIT) CAPS capsule; Take 1 capsule (50,000 Units total) by mouth every 7 (seven) days.  Dispense: 4 capsule; Refill: 0  3. Nausea Darlene [name] agreed to  start Zofran 4 mg q 8 hours as needed for nausea, with no refills.  - ondansetron (ZOFRAN ODT) 4 MG disintegrating tablet; Take 1 tablet (4 mg total) by mouth every 8 (eight) hours as needed for nausea or vomiting.  Dispense: 20 tablet; Refill: 0  4. Class 2 severe obesity with serious comorbidity and body mass index (BMI) of 39.0 to 39.9 in adult, unspecified obesity type Darlene [name]) Darlene [name] is currently in the action stage of change. As such, her goal is to continue with weight loss efforts. She has agreed to keeping a food journal and adhering to recommended goals of [phone] calories and 80 grams of protein daily.   Darlene [name] may add a protein shake once daily.  Exercise goals: As is.  Behavioral modification strategies: increasing lean protein intake, increasing vegetables and increasing water intake.  Darlene [name] has agreed to follow-up with our clinic in 3 weeks.   Objective:   Blood pressure 110/69, pulse 90, temperature 97.7 F (36.5 C), height 4\' 11"  (1.499 m), weight 195 lb (88.5 kg), SpO2 100 %. Body mass index is 39.39 kg/m.  Darlene [name] is using a walker for ambulation. General: Cooperative, alert, well developed, in no acute distress. HEENT: Conjunctivae and lids unremarkable. Cardiovascular: Regular rhythm.  Lungs: Normal work of breathing. Neurologic: No focal deficits.   Lab Results  Component Value Date   CREATININE 0.61 07/15/2020   BUN 8 07/15/2020   NA 140 07/15/2020   K 4.2 07/15/2020   CL 110 07/15/2020  CO2 22 07/15/2020   Lab Results  Component Value Date   ALT 14 07/08/2020   AST 18 07/08/2020   ALKPHOS 78 07/08/2020   BILITOT 0.5 07/08/2020   Lab Results  Component Value Date   HGBA1C 5.9 (H) 06/23/2020   HGBA1C 5.6 04/01/2020   HGBA1C 5.7 (H) 09/17/2019   HGBA1C 5.9 (H) 06/04/2019   HGBA1C 5.9 03/01/2018   Lab Results  Component Value Date   INSULIN 10.5 06/23/2020   INSULIN 12.2 04/01/2020   INSULIN 17.7 09/17/2019   INSULIN 10.7 06/04/2019    Lab Results  Component Value Date   TSH 1.620 06/04/2019   Lab Results  Component Value Date   CHOL 225 (H) 06/23/2020   HDL 73 06/23/2020   LDLCALC 131 (H) 06/23/2020   TRIG 123 06/23/2020   CHOLHDL 3 03/01/2018   Lab Results  Component Value Date   WBC 11.6 (H) 07/16/2020   HGB 10.7 (L) 07/16/2020   HCT 34.1 (L) 07/16/2020   MCV 88.8 07/16/2020   PLT 297 07/16/2020   No results found for: IRON, TIBC, FERRITIN  Obesity Behavioral Intervention:   Approximately 15 minutes were spent on the discussion below.  ASK: We discussed the diagnosis of obesity with Darlene [name] today and Darlene [name] agreed to give [place] permission to discuss obesity behavioral modification therapy today.  ASSESS: Darlene [name] has the diagnosis of obesity and her BMI today is 39.36. Darlene [name] is in the action stage of change.   ADVISE: Darlene [name] was educated on the multiple health risks of obesity as well as the benefit of weight loss to improve her health. She was advised of the need for long term treatment and the importance of lifestyle modifications to improve her current health and to decrease her risk of future health problems.  AGREE: Multiple dietary modification options and treatment options were discussed and Darlene [name] agreed to follow the recommendations documented in the above note.  ARRANGE: Darlene [name] was educated on the importance of frequent visits to treat obesity as outlined per CMS and USPSTF guidelines and agreed to schedule her next follow up appointment today.  Attestation Statements:   Reviewed by clinician on day of visit: allergies, medications, problem list, medical history, surgical history, family history, social history, and previous encounter notes.   [name], am acting as Energy manager for [place], FNP-C.  I have reviewed the above documentation for accuracy and completeness, and I agree with the above. -  Jesse Sans, FNP

## 2020-08-17 ENCOUNTER — Encounter (INDEPENDENT_AMBULATORY_CARE_PROVIDER_SITE_OTHER): Payer: Self-pay | Admitting: Family Medicine

## 2020-08-17 DIAGNOSIS — M25562 Pain in left knee: Secondary | ICD-10-CM | POA: Diagnosis not present

## 2020-08-18 DIAGNOSIS — Z96652 Presence of left artificial knee joint: Secondary | ICD-10-CM | POA: Diagnosis not present

## 2020-08-19 DIAGNOSIS — M25562 Pain in left knee: Secondary | ICD-10-CM | POA: Diagnosis not present

## 2020-08-23 ENCOUNTER — Other Ambulatory Visit: Payer: Self-pay | Admitting: Neurology

## 2020-08-24 ENCOUNTER — Other Ambulatory Visit: Payer: Self-pay

## 2020-08-24 ENCOUNTER — Encounter: Payer: Self-pay | Admitting: Podiatrist

## 2020-08-24 ENCOUNTER — Ambulatory Visit (INDEPENDENT_AMBULATORY_CARE_PROVIDER_SITE_OTHER): Payer: Medicare HMO

## 2020-08-24 ENCOUNTER — Other Ambulatory Visit: Payer: Self-pay | Admitting: Podiatrist

## 2020-08-24 ENCOUNTER — Ambulatory Visit (INDEPENDENT_AMBULATORY_CARE_PROVIDER_SITE_OTHER): Payer: Medicare HMO | Admitting: Podiatrist

## 2020-08-24 DIAGNOSIS — S99912A Unspecified injury of left ankle, initial encounter: Secondary | ICD-10-CM | POA: Diagnosis not present

## 2020-08-24 DIAGNOSIS — R52 Pain, unspecified: Secondary | ICD-10-CM

## 2020-08-24 DIAGNOSIS — S86309A Unspecified injury of muscle(s) and tendon(s) of peroneal muscle group at lower leg level, unspecified leg, initial encounter: Secondary | ICD-10-CM | POA: Diagnosis not present

## 2020-08-24 DIAGNOSIS — S99922A Unspecified injury of left foot, initial encounter: Secondary | ICD-10-CM

## 2020-08-24 NOTE — Patient Instructions (Signed)
Peroneal Tendon Tear  The peroneal tendons are strong bands of tissue that are on the outside of your ankle. They connect a bone in your foot to the muscles that let you turn your feet outward and stand on your toes. A peroneal tendon tear (rupture) can include a partial or complete tear of the tendon. This injury can interfere with your ability to straighten your foot or turn your foot to the outside. What are the causes? This condition may be caused by a long-term injury, or it may occur suddenly. This injury may be caused by:  Doing repeated ankle movements.  Placing sudden stress on the peroneal tendons, which stretches them farther or faster than they are meant to stretch.  Having certain medical problems.  Taking certain medicines for a long time. What increases the risk? The following factors may make you more likely to develop this condition:  Participating in activities that: ? Require sudden and quick tightening (contractions) of the muscles of the lower leg. These include jumping and quick starts. ? Involve kicking with the outer part of the foot, such as martial arts.  Having a weakened tendon from: ? A misshapen groove in the bottom of the heel bone where the peroneal tendons sit. ? Poor foot strength and flexibility. ? A past ankle sprain. ? A past or untreated peroneal tendon injury. ? A medical problem that decreases blood supply to the peroneal tendons.  Failing to warm up properly before activities.  Having shots to treat swelling and inflammation (corticosteroid injections) in or near the peroneal tendon.  Using certain antibiotics. What are the signs or symptoms? Symptoms of this condition include:  A snapping or popping sound behind the outer part of your ankle. You may also have a feeling that something is torn in your ankle.  Pain when moving your foot up and down.  A clicking feeling when moving your foot up and down.  Inability to stand on the toes  or the ball of your foot, or weakness when trying to stand on the toes or the ball of the foot.  Pain, swelling, and bruising around the injured area. How is this diagnosed? This condition may be diagnosed based on:  Your medical history.  Your symptoms.  A physical exam.  Imaging studies. These may include X-rays, an MRI, or an ultrasound. How is this treated? This condition may be treated with:  Applying ice, resting, and taking medicines.  Wearing a splint or a brace to support your ankle and keep it still.  Having surgery. This is done in severe cases.  Doing physical therapy to increase your strength and flexibility. Follow these instructions at home: If you have a splint or brace:  Wear it as told by your health care provider. Remove it only as told by your health care provider.  Loosen it f your toes tingle, become numb, or turn cold and blue.  Check the skin around it every day. Tell your health care provider about any concerns.  Keep it clean and dry.  If the splint or brace is not waterproof: ? Do not let it get wet. ? Cover it with a watertight covering when you take a bath or shower. Managing pain, stiffness, and swelling   If directed, put ice on the injured area. ? If you have a removable splint or brace, remove it as told by your health care provider. ? Put ice in a plastic bag. ? Place a towel between your skin and the  bag. ? Leave the ice on for 20 minutes, 2-3 times a day.  Move your toes often to reduce stiffness and swelling.  Raise (elevate) the injured area above the level of your heart while you are sitting or lying down. Activity  Do not use the injured ankle to support your body weight until your health care provider says that you can. Use crutches as told by your health care provider.  Return to your normal activities as told by your health care provider. Ask your health care provider what activities are safe for you.  Ask your health  care provider when it is safe to drive if you have a splint or brace on your ankle. General instructions  Take over-the-counter and prescription medicines only as told by your health care provider.  Do not use any products that contain nicotine or tobacco, such as cigarettes, e-cigarettes, and chewing tobacco. These can delay healing. If you need help quitting, ask your health care provider.  Keep all follow-up visits as told by your health care provider. This is important. How is this prevented?  Warm up and stretch before being active.  Cool down and stretch after being active.  Give your body time to rest between periods of activity.  Maintain physical fitness, including strength and flexibility. Contact a health care provider if:  Your pain gets worse even after treatment.  Your foot or ankle feels numb or cold, or looks blue. Summary  A peroneal tendon tear (rupture) can include a partial or complete tear to the tendons that are on the outside of your ankle.  Symptoms include a sound like a snap or a pop behind the outer part of your ankle, or a feeling that something is torn in your ankle. You may also have pain, swelling, and bruising of your ankle.  This condition is treated with rest, ice, medicines, and surgery.  Contact a health care provider if your pain gets worse, or your ankle feels numb or cold, or looks blue. This information is not intended to replace advice given to you by your health care provider. Make sure you discuss any questions you have with your health care provider. Document Revised: 11/15/2018 Document Reviewed: 11/15/2018 Elsevier Patient Education  2020 [employer].
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
## 2020-08-25 DIAGNOSIS — M25562 Pain in left knee: Secondary | ICD-10-CM | POA: Diagnosis not present

## 2020-08-31 ENCOUNTER — Ambulatory Visit (INDEPENDENT_AMBULATORY_CARE_PROVIDER_SITE_OTHER): Payer: Medicare HMO | Admitting: Family Medicine

## 2020-08-31 ENCOUNTER — Encounter (INDEPENDENT_AMBULATORY_CARE_PROVIDER_SITE_OTHER): Payer: Self-pay | Admitting: Family Medicine

## 2020-08-31 ENCOUNTER — Other Ambulatory Visit: Payer: Self-pay

## 2020-08-31 VITALS — BP 115/74 | HR 84 | Temp 98.4°F | Ht 59.0 in | Wt 192.0 lb

## 2020-08-31 DIAGNOSIS — Z6838 Body mass index (BMI) 38.0-38.9, adult: Secondary | ICD-10-CM | POA: Diagnosis not present

## 2020-08-31 DIAGNOSIS — K5909 Other constipation: Secondary | ICD-10-CM | POA: Diagnosis not present

## 2020-08-31 DIAGNOSIS — E559 Vitamin D deficiency, unspecified: Secondary | ICD-10-CM

## 2020-08-31 DIAGNOSIS — R7303 Prediabetes: Secondary | ICD-10-CM | POA: Diagnosis not present

## 2020-08-31 MED ORDER — VITAMIN D (ERGOCALCIFEROL) 1.25 MG (50000 UNIT) PO CAPS: 50000.0000 [IU] | ORAL_CAPSULE | ORAL | 0 refills | Status: DC

## 2020-08-31 MED ORDER — OZEMPIC (0.25 OR 0.5 MG/DOSE) 2 MG/1.5ML ~~LOC~~ SOPN: 0.2500 mg | PEN_INJECTOR | SUBCUTANEOUS | 0 refills | Status: DC

## 2020-09-01 ENCOUNTER — Ambulatory Visit
Admission: RE | Admit: 2020-09-01 | Discharge: 2020-09-01 | Disposition: A | Discharge status: Home / Self Care | Payer: Medicare HMO | Source: Ambulatory Visit | Attending: Podiatrist | Admitting: Podiatrist

## 2020-09-01 DIAGNOSIS — S99912A Unspecified injury of left ankle, initial encounter: Secondary | ICD-10-CM

## 2020-09-01 DIAGNOSIS — S86309A Unspecified injury of muscle(s) and tendon(s) of peroneal muscle group at lower leg level, unspecified leg, initial encounter: Secondary | ICD-10-CM

## 2020-09-02 ENCOUNTER — Encounter (INDEPENDENT_AMBULATORY_CARE_PROVIDER_SITE_OTHER): Payer: Self-pay | Admitting: Family Medicine

## 2020-09-02 NOTE — Progress Notes (Signed)
Chief Complaint:   OBESITY Darlene [name] is here to discuss her progress with her obesity treatment plan along with follow-up of her obesity related diagnoses. Darlene [name] is on keeping a food journal and adhering to recommended goals of [phone] calories and 80 grams of protein daily and states she is following her eating plan approximately 100% of the time. Darlene [name] states she is doing leg extends and stretches for 30 minutes 5 times per week.  Today's visit was #: 25 Starting weight: 213 lbs Starting date: 06/04/2019 Today's weight: 192 lbs Today's date: 08/31/2020 Total lbs lost to date: 21 Total lbs lost since last in-office visit: 3  Interim History: Darlene [name] has gotten her appetite back. She is status post total knee replacement (07/13/21), and her appetite had been decreased. She is now meeting her calorie and protein goals. She is going to PT a few times per week.  Subjective:   1. Other constipation Darlene [name] notes constipation due taking tramadol for post total knee replacement pain and also being on Ozempic. She is on miralax daily but still has constipation. Her water intake is good.  2. Pre-diabetes Darlene [name] is on Ozempic 0.25 mg and she feels this is beneficial for her appetite. She denies nausea or hypoglycemia.  Lab Results  Component Value Date   HGBA1C 5.9 (H) 06/23/2020   Lab Results  Component Value Date   INSULIN 10.5 06/23/2020   INSULIN 12.2 04/01/2020   INSULIN 17.7 09/17/2019   INSULIN 10.7 06/04/2019   3. Vitamin D deficiency Darlene [name]'s Vit D level is low at 44.5. She is on prescription Vit D 50,000 IU weekly.  Assessment/Plan:   1. Other constipation  Darlene [name] is to add metamucil daily and continue miralax daily.   2. Pre-diabetes  We will refill Ozempic for 1 month.  - Semaglutide,0.25 or 0.5MG /DOS, (OZEMPIC, 0.25 OR 0.5 MG/DOSE,) 2 MG/1.5ML SOPN; Inject 0.25 mg into the skin once a week.  Dispense: 1.5 mL; Refill: 0  3. Vitamin D deficiency  We will  refill prescription Vitamin D for 1 month.  - Vitamin D, Ergocalciferol, (DRISDOL) 1.25 MG (50000 UNIT) CAPS capsule; Take 1 capsule (50,000 Units total) by mouth every 7 (seven) days.  Dispense: 4 capsule; Refill: 0  4. Class 2 severe obesity with serious comorbidity and body mass index (BMI) of 38.0 to 38.9 in adult, unspecified obesity type Darlene [name]) Darlene [name] is currently in the action stage of change. As such, her goal is to continue with weight loss efforts. She has agreed to keeping a food journal and adhering to recommended goals of [phone] calories and 80 grams of protein daily.   Exercise goals: As is.  Behavioral modification strategies: increasing lean protein intake and decreasing simple carbohydrates.  Darlene [name] has agreed to follow-up with our clinic in 4 weeks.   Objective:   Blood pressure 115/74, pulse 84, temperature 98.4 F (36.9 C), height 4\' 11"  (1.499 m), weight 192 lb (87.1 kg), SpO2 99 %. Body mass index is 38.78 kg/m.  Darlene [name] uses a cane for ambulation. General: Cooperative, alert, well developed, in no acute distress. HEENT: Conjunctivae and lids unremarkable. Cardiovascular: Regular rhythm.  Lungs: Normal work of breathing. Neurologic: No focal deficits.   Lab Results  Component Value Date   CREATININE 0.61 07/15/2020   BUN 8 07/15/2020   NA 140 07/15/2020   K 4.2 07/15/2020   CL 110 07/15/2020   CO2 22 07/15/2020   Lab Results  Component Value Date   ALT 14 07/08/2020  AST 18 07/08/2020   ALKPHOS 78 07/08/2020   BILITOT 0.5 07/08/2020   Lab Results  Component Value Date   HGBA1C 5.9 (H) 06/23/2020   HGBA1C 5.6 04/01/2020   HGBA1C 5.7 (H) 09/17/2019   HGBA1C 5.9 (H) 06/04/2019   HGBA1C 5.9 03/01/2018   Lab Results  Component Value Date   INSULIN 10.5 06/23/2020   INSULIN 12.2 04/01/2020   INSULIN 17.7 09/17/2019   INSULIN 10.7 06/04/2019   Lab Results  Component Value Date   TSH 1.620 06/04/2019   Lab Results  Component Value Date    CHOL 225 (H) 06/23/2020   HDL 73 06/23/2020   LDLCALC 131 (H) 06/23/2020   TRIG 123 06/23/2020   CHOLHDL 3 03/01/2018   Lab Results  Component Value Date   WBC 11.6 (H) 07/16/2020   HGB 10.7 (L) 07/16/2020   HCT 34.1 (L) 07/16/2020   MCV 88.8 07/16/2020   PLT 297 07/16/2020   No results found for: IRON, TIBC, FERRITIN  Obesity Behavioral Intervention:   Approximately 15 minutes were spent on the discussion below.  ASK: We discussed the diagnosis of obesity with Darlene [name] today and Darlene [name] agreed to give [place] permission to discuss obesity behavioral modification therapy today.  ASSESS: Darlene [name] has the diagnosis of obesity and her BMI today is 38.76. Darlene [name] is in the action stage of change.   ADVISE: Darlene [name] was educated on the multiple health risks of obesity as well as the benefit of weight loss to improve her health. She was advised of the need for long term treatment and the importance of lifestyle modifications to improve her current health and to decrease her risk of future health problems.  AGREE: Multiple dietary modification options and treatment options were discussed and Darlene [name] agreed to follow the recommendations documented in the above note.  ARRANGE: Darlene [name] was educated on the importance of frequent visits to treat obesity as outlined per CMS and USPSTF guidelines and agreed to schedule her next follow up appointment today.  Attestation Statements:   Reviewed by clinician on day of visit: allergies, medications, problem list, medical history, surgical history, family history, social history, and previous encounter notes.   Darlene [name], am acting as Energy manager for [place], FNP-C.  I have reviewed the above documentation for accuracy and completeness, and I agree with the above. -  Darlene Sans, FNP

## 2020-09-03 ENCOUNTER — Telehealth: Payer: Self-pay | Admitting: Podiatrist

## 2020-09-03 NOTE — Telephone Encounter (Signed)
Patient called and asked would Dr. Irving Shows call her and explain the MRI results to her. She received a copy. Please give patient a call.

## 2020-09-09 ENCOUNTER — Other Ambulatory Visit: Payer: Self-pay | Admitting: Podiatrist

## 2020-09-09 DIAGNOSIS — S86309A Unspecified injury of muscle(s) and tendon(s) of peroneal muscle group at lower leg level, unspecified leg, initial encounter: Secondary | ICD-10-CM

## 2020-09-09 DIAGNOSIS — S93402A Sprain of unspecified ligament of left ankle, initial encounter: Secondary | ICD-10-CM

## 2020-09-09 DIAGNOSIS — R262 Difficulty in walking, not elsewhere classified: Secondary | ICD-10-CM

## 2020-09-09 NOTE — Telephone Encounter (Signed)
I spoke with Darlene [name] and explained the MRI results with her.  We agreed Physical therapy would be beneficial and I am ordering PT through [facility] for 2-3 x per week for 8-12 weeks.  She will follow up with Dr. Ardelle Anton in 3-4 weeks for a check to see how her foot is responding.

## 2020-09-10 ENCOUNTER — Ambulatory Visit: Payer: Medicare HMO | Admitting: Podiatry

## 2020-09-14 ENCOUNTER — Other Ambulatory Visit: Payer: Self-pay

## 2020-09-14 ENCOUNTER — Ambulatory Visit
Admission: RE | Admit: 2020-09-14 | Discharge: 2020-09-14 | Disposition: A | Discharge status: Home / Self Care | Payer: Medicare HMO | Source: Ambulatory Visit | Attending: Family Medicine | Admitting: Family Medicine

## 2020-09-14 DIAGNOSIS — Z13820 Encounter for screening for osteoporosis: Secondary | ICD-10-CM

## 2020-09-14 DIAGNOSIS — Z1231 Encounter for screening mammogram for malignant neoplasm of breast: Secondary | ICD-10-CM

## 2020-09-28 ENCOUNTER — Telehealth (INDEPENDENT_AMBULATORY_CARE_PROVIDER_SITE_OTHER): Payer: Medicare HMO | Admitting: Family Medicine

## 2020-09-28 DIAGNOSIS — Z6838 Body mass index (BMI) 38.0-38.9, adult: Secondary | ICD-10-CM

## 2020-09-28 DIAGNOSIS — R7303 Prediabetes: Secondary | ICD-10-CM | POA: Diagnosis not present

## 2020-09-28 DIAGNOSIS — E559 Vitamin D deficiency, unspecified: Secondary | ICD-10-CM

## 2020-09-28 MED ORDER — OZEMPIC (0.25 OR 0.5 MG/DOSE) 2 MG/1.5ML ~~LOC~~ SOPN: 0.2500 mg | PEN_INJECTOR | SUBCUTANEOUS | 0 refills | Status: DC

## 2020-09-30 ENCOUNTER — Encounter (INDEPENDENT_AMBULATORY_CARE_PROVIDER_SITE_OTHER): Payer: Self-pay | Admitting: Family Medicine

## 2020-09-30 NOTE — Progress Notes (Signed)
TeleHealth Visit:  Due to the COVID-19 pandemic, this visit was completed with telemedicine (audio/video) technology to reduce patient and provider exposure as well as to preserve personal protective equipment.   Darlene [name] has verbally consented to this TeleHealth visit. The patient is located at home, the provider is located at the [facility] office. The participants in this visit include the listed provider and patient. The visit was conducted today via video.  Chief Complaint: OBESITY Darlene [name] is here to discuss her progress with her obesity treatment plan along with follow-up of her obesity related diagnoses. Darlene [name] is on keeping a food journal and adhering to recommended goals of [phone] calories and 80 g protein and states she is following her eating plan approximately 80% of the time. Darlene [name] states she is doing physical therapy.  Today's visit was #: 26 Starting weight: 213 lbs Starting date: 06/04/2019  Interim History: Darlene [name] reports she has gained about a pound over the holidays. She notes some nausea from certain foods, but she does not feel this is from the Ozempic but rather  the tramadol. She is journaling regularly but not meeting protein goals due to inability to tolerate eggs and milk (nausea).  Subjective:   1. Pre-diabetes  Darlene [name] is on Ozempic. She does note some nausea from tramadol (status post total hip replacement). Last A1c was 5.9. Her constipation has improved.   Lab Results  Component Value Date   HGBA1C 5.9 (H) 06/23/2020   Lab Results  Component Value Date   INSULIN 10.5 06/23/2020   INSULIN 12.2 04/01/2020   INSULIN 17.7 09/17/2019   INSULIN 10.7 06/04/2019    2. Vitamin D deficiency Darlene [name]'s Vitamin D level was 44.5 on 06/23/2020, which is nearly at goal. She is currently taking prescription vitamin D 50,000 IU each week.   Assessment/Plan:   1. Pre-diabetes  Refill Ozempic for 1 month, as per below.  - Semaglutide,0.25 or  0.5MG /DOS, (OZEMPIC, 0.25 OR 0.5 MG/DOSE,) 2 MG/1.5ML SOPN; Inject 0.25 mg into the skin once a week.  Dispense: 3 each; Refill: 0  2. Vitamin D deficiency . She agrees to continue to take prescription Vitamin D @50 ,000 IU every week.  3. Class 2 severe obesity with serious comorbidity and body mass index (BMI) of 38.0 to 38.9 in adult, unspecified obesity type Darlene [name]) Darlene [name] is currently in the action stage of change. As such, her goal is to continue with weight loss efforts. She has agreed to keeping a food journal and adhering to recommended goals of [phone] calories and 80 g protein.   Exercise goals: As is  Behavioral modification strategies: increasing lean protein intake and meal planning and cooking strategies.  Darlene [name] has agreed to follow-up with our clinic in 3 weeks.   Objective:   VITALS: Per patient if applicable, see vitals. GENERAL: Alert and in no acute distress. CARDIOPULMONARY: No increased WOB. Speaking in clear sentences.  PSYCH: Pleasant and cooperative. Speech normal rate and rhythm. Affect is appropriate. Insight and judgement are appropriate. Attention is focused, linear, and appropriate.  NEURO: Oriented as arrived to appointment on time with no prompting.   Lab Results  Component Value Date   CREATININE 0.61 07/15/2020   BUN 8 07/15/2020   NA 140 07/15/2020   K 4.2 07/15/2020   CL 110 07/15/2020   CO2 22 07/15/2020   Lab Results  Component Value Date   ALT 14 07/08/2020   AST 18 07/08/2020   ALKPHOS 78 07/08/2020   BILITOT 0.5 07/08/2020  Lab Results  Component Value Date   HGBA1C 5.9 (H) 06/23/2020   HGBA1C 5.6 04/01/2020   HGBA1C 5.7 (H) 09/17/2019   HGBA1C 5.9 (H) 06/04/2019   HGBA1C 5.9 03/01/2018   Lab Results  Component Value Date   INSULIN 10.5 06/23/2020   INSULIN 12.2 04/01/2020   INSULIN 17.7 09/17/2019   INSULIN 10.7 06/04/2019   Lab Results  Component Value Date   TSH 1.620 06/04/2019   Lab Results  Component Value Date    CHOL 225 (H) 06/23/2020   HDL 73 06/23/2020   LDLCALC 131 (H) 06/23/2020   TRIG 123 06/23/2020   CHOLHDL 3 03/01/2018   Lab Results  Component Value Date   WBC 11.6 (H) 07/16/2020   HGB 10.7 (L) 07/16/2020   HCT 34.1 (L) 07/16/2020   MCV 88.8 07/16/2020   PLT 297 07/16/2020   No results found for: IRON, TIBC, FERRITIN  Attestation Statements:   Reviewed by clinician on day of visit: allergies, medications, problem list, medical history, surgical history, family history, social history, and previous encounter notes.  Edmund Hilda, am acting as Energy manager for [place], FNP.  I have reviewed the above documentation for accuracy and completeness, and I agree with the above. - Jesse Sans, FNP

## 2020-10-05 ENCOUNTER — Ambulatory Visit: Payer: Medicare HMO | Admitting: Podiatry

## 2020-10-12 ENCOUNTER — Ambulatory Visit (INDEPENDENT_AMBULATORY_CARE_PROVIDER_SITE_OTHER): Payer: Medicare HMO | Admitting: Family Medicine

## 2020-10-27 ENCOUNTER — Other Ambulatory Visit: Payer: Self-pay

## 2020-10-27 ENCOUNTER — Ambulatory Visit: Payer: Medicare HMO

## 2020-10-27 ENCOUNTER — Ambulatory Visit (INDEPENDENT_AMBULATORY_CARE_PROVIDER_SITE_OTHER): Payer: Medicare HMO | Admitting: Podiatry

## 2020-10-27 DIAGNOSIS — M25572 Pain in left ankle and joints of left foot: Secondary | ICD-10-CM | POA: Diagnosis not present

## 2020-10-27 DIAGNOSIS — G8929 Other chronic pain: Secondary | ICD-10-CM

## 2020-10-27 DIAGNOSIS — S96912D Strain of unspecified muscle and tendon at ankle and foot level, left foot, subsequent encounter: Secondary | ICD-10-CM | POA: Diagnosis not present

## 2020-11-02 NOTE — Progress Notes (Signed)
Subjective: 70 year old female presents the office today for follow-up evaluation of ankle pain.  She also discussed her MRI results and more detail.  Overall she feels she is making improvement with physical therapy and is becoming stronger.  She denies any recent injury or fall since she was last seen in the office she has no other concerns today. Denies any systemic complaints such as fevers, chills, nausea, vomiting. No acute changes since last appointment, and no other complaints at this time.   Objective: AAO x3, NAD DP/PT pulses palpable bilaterally, CRT less than 3 seconds On the left ankle today there is mild tenderness palpation the course the posterior tibial tendon but there is no significant edema, erythema.  There is no area pinpoint tenderness.  Flexor, extensor tendons appear to be intact there is no evidence of tendon rupture.  MMT 5/5. No pain with calf compression, swelling, warmth, erythema  Assessment: Partial tear posterior tibial tendon, flatfoot  Plan: -All treatment options discussed with the patient including all alternatives, risks, complications.  -Overall she is making progress with physical therapy.  We discussed surgical intervention but she wants to hold off on this.  We will continue physical therapy we discussed wearing supportive shoes, orthotics as well. -Patient encouraged to call the office with any questions, concerns, change in symptoms.   Darlene [name] DPM

## 2020-11-03 ENCOUNTER — Encounter (INDEPENDENT_AMBULATORY_CARE_PROVIDER_SITE_OTHER): Payer: Self-pay | Admitting: Family Medicine

## 2020-11-03 ENCOUNTER — Ambulatory Visit (INDEPENDENT_AMBULATORY_CARE_PROVIDER_SITE_OTHER): Payer: Medicare HMO | Admitting: Family Medicine

## 2020-11-03 ENCOUNTER — Other Ambulatory Visit: Payer: Self-pay

## 2020-11-03 VITALS — BP 117/69 | HR 88 | Temp 98.3°F | Ht 59.0 in | Wt 184.0 lb

## 2020-11-03 DIAGNOSIS — Z6837 Body mass index (BMI) 37.0-37.9, adult: Secondary | ICD-10-CM | POA: Diagnosis not present

## 2020-11-03 DIAGNOSIS — D509 Iron deficiency anemia, unspecified: Secondary | ICD-10-CM

## 2020-11-03 DIAGNOSIS — E7849 Other hyperlipidemia: Secondary | ICD-10-CM | POA: Diagnosis not present

## 2020-11-03 DIAGNOSIS — R7303 Prediabetes: Secondary | ICD-10-CM | POA: Diagnosis not present

## 2020-11-03 DIAGNOSIS — E669 Obesity, unspecified: Secondary | ICD-10-CM

## 2020-11-03 DIAGNOSIS — E559 Vitamin D deficiency, unspecified: Secondary | ICD-10-CM

## 2020-11-03 MED ORDER — OZEMPIC (0.25 OR 0.5 MG/DOSE) 2 MG/1.5ML ~~LOC~~ SOPN: 0.5000 mg | PEN_INJECTOR | SUBCUTANEOUS | 0 refills | Status: DC

## 2020-11-03 MED ORDER — VITAMIN D (ERGOCALCIFEROL) 1.25 MG (50000 UNIT) PO CAPS: 50000.0000 [IU] | ORAL_CAPSULE | ORAL | 0 refills | Status: DC

## 2020-11-03 NOTE — Progress Notes (Signed)
The 10-year ASCVD risk score Denman George DC Montez Hageman., et al., 2013) is: 9.1%   Values used to calculate the score:     Age: 70 years     Sex: Female     Is Non-Hispanic African American: Yes     Diabetic: No     Tobacco smoker: No     Systolic Blood Pressure: 117 mmHg     Is BP treated: Yes     HDL Cholesterol: 73 mg/dL     Total Cholesterol: 225 mg/dL

## 2020-11-04 ENCOUNTER — Encounter (INDEPENDENT_AMBULATORY_CARE_PROVIDER_SITE_OTHER): Payer: Self-pay | Admitting: Family Medicine

## 2020-11-04 LAB — CBC WITH DIFFERENTIAL/PLATELET
Basophils Absolute: 0.1 10*3/uL (ref 0.0–0.2)
Basos: 1 %
EOS (ABSOLUTE): 0.1 10*3/uL (ref 0.0–0.4)
Eos: 1 %
Hematocrit: 41.2 % (ref 34.0–46.6)
Hemoglobin: 13.4 g/dL (ref 11.1–15.9)
Immature Grans (Abs): 0 10*3/uL (ref 0.0–0.1)
Immature Granulocytes: 0 %
Lymphocytes Absolute: 2.8 10*3/uL (ref 0.7–3.1)
Lymphs: 45 %
MCH: 26.2 pg — ABNORMAL LOW (ref 26.6–33.0)
MCHC: 32.5 g/dL (ref 31.5–35.7)
MCV: 81 fL (ref 79–97)
Monocytes Absolute: 0.4 10*3/uL (ref 0.1–0.9)
Monocytes: 7 %
Neutrophils Absolute: 2.9 10*3/uL (ref 1.4–7.0)
Neutrophils: 46 %
Platelets: 338 10*3/uL (ref 150–450)
RBC: 5.11 x10E6/uL (ref 3.77–5.28)
RDW: 14.3 % (ref 11.7–15.4)
WBC: 6.2 10*3/uL (ref 3.4–10.8)

## 2020-11-04 LAB — LIPID PANEL WITH LDL/HDL RATIO
Cholesterol, Total: 217 mg/dL — ABNORMAL HIGH (ref 100–199)
HDL: 73 mg/dL (ref >39)
LDL Chol Calc (NIH): 121 mg/dL — ABNORMAL HIGH (ref 0–99)
LDL/HDL Ratio: 1.7 ratio (ref 0.0–3.2)
Triglycerides: 134 mg/dL (ref 0–149)
VLDL Cholesterol Cal: 23 mg/dL (ref 5–40)

## 2020-11-04 LAB — COMPREHENSIVE METABOLIC PANEL
ALT: 9 IU/L (ref 0–32)
AST: 17 IU/L (ref 0–40)
Albumin/Globulin Ratio: 1.9 (ref 1.2–2.2)
Albumin: 4.9 g/dL — ABNORMAL HIGH (ref 3.8–4.8)
Alkaline Phosphatase: 121 IU/L (ref 44–121)
BUN/Creatinine Ratio: 13 (ref 12–28)
BUN: 9 mg/dL (ref 8–27)
Bilirubin Total: 0.3 mg/dL (ref 0.0–1.2)
CO2: 25 mmol/L (ref 20–29)
Calcium: 10.2 mg/dL (ref 8.7–10.3)
Chloride: 102 mmol/L (ref 96–106)
Creatinine, Ser: 0.72 mg/dL (ref 0.57–1.00)
GFR calc Af Amer: 100 mL/min/{1.73_m2} (ref >59)
GFR calc non Af Amer: 87 mL/min/{1.73_m2} (ref >59)
Globulin, Total: 2.6 g/dL (ref 1.5–4.5)
Glucose: 93 mg/dL (ref 65–99)
Potassium: 4.4 mmol/L (ref 3.5–5.2)
Sodium: 142 mmol/L (ref 134–144)
Total Protein: 7.5 g/dL (ref 6.0–8.5)

## 2020-11-04 LAB — VITAMIN D 25 HYDROXY (VIT D DEFICIENCY, FRACTURES): Vit D, 25-Hydroxy: 46 ng/mL (ref 30.0–100.0)

## 2020-11-04 LAB — HEMOGLOBIN A1C
Est. average glucose Bld gHb Est-mCnc: 120 mg/dL
Hgb A1c MFr Bld: 5.8 % — ABNORMAL HIGH (ref 4.8–5.6)

## 2020-11-04 LAB — INSULIN, RANDOM: INSULIN: 9.6 u[IU]/mL (ref 2.6–24.9)

## 2020-11-04 NOTE — Progress Notes (Signed)
Chief Complaint:   OBESITY Ciela is here to discuss her progress with her obesity treatment plan along with follow-up of her obesity related diagnoses. Chenika is on keeping a food journal and adhering to recommended goals of [phone] calories and 80 grams of protein daily and states she is following her eating plan approximately 90% of the time. Mabeline states she is doing water therapy and home exercise for 45 minutes 2 times per week.  Today's visit was #: 27 Starting weight: 213 lbs Starting date: 06/04/2019 Today's weight: 184 lbs Today's date: 11/03/2020 Total lbs lost to date: 29 Total lbs lost since last in-office visit: 8  Interim History: She has not had an office visit since 08/31/2020 and she is down 8 lbs. Nazariah is now off of Tramadol which was making her nauseated.  She is journaling daily. She is focusing on fiber and protein. She is meeting her protein goal consistently. .  Subjective:   1. Vitamin D deficiency Rmoni's last Vit D level was nearly at goal at 44.5 on 06/23/2020. She is on weekly prescription Vit D.  2. Pre-diabetes Wonder feels Ozempic is not working as well as it was. Last A1c was 5.9. She denies nausea or hypoglycemia.  Lab Results  Component Value Date   HGBA1C 5.8 (H) 11/03/2020   Lab Results  Component Value Date   INSULIN 9.6 11/03/2020   INSULIN 10.5 06/23/2020   INSULIN 12.2 04/01/2020   INSULIN 17.7 09/17/2019   INSULIN 10.7 06/04/2019   3. Other hyperlipidemia Tashaya's last LDL was elevated at 131 on 06/23/2020. Her HDL and triglycerides were within normal limits. She is not on statin. She reports she has increased fiber intake to reduce her cholesterol. Her ASVCD 10 year risk score is 9.17%. She denies any chest pain, claudication or myalgias.  Lab Results  Component Value Date   ALT 9 11/03/2020   AST 17 11/03/2020   ALKPHOS 121 11/03/2020   BILITOT 0.3 11/03/2020   Lab Results  Component Value Date   CHOL 217 (H)  11/03/2020   HDL 73 11/03/2020   LDLCALC 121 (H) 11/03/2020   TRIG 134 11/03/2020   CHOLHDL 3 03/01/2018    4. Iron deficiency anemia, unspecified iron deficiency anemia type Samaya's last hemoglobin was 10.8, and hematocrit was 34.1 after her total knee replacement on 07/13/2020.  CBC Latest Ref Rng & Units 11/03/2020 07/16/2020 07/15/2020  WBC 3.4 - 10.8 x10E3/uL 6.2 11.6(H) 10.1  Hemoglobin 11.1 - 15.9 g/dL 13.0 10.7(L) 9.7(L)  Hematocrit 34.0 - 46.6 % 41.2 34.1(L) 31.2(L)  Platelets 150 - 450 x10E3/uL 338 297 243   No results found for: IRON, TIBC, FERRITIN No results found for: VITAMINB12  Assessment/Plan:   1. Vitamin D deficiency  We will check labs today. We will refill prescription Vitamin D for 1 month. Danaye will follow-up for routine testing of Vitamin D, at least 2-3 times per year to avoid over-replacement.  - VITAMIN D 25 Hydroxy (Vit-D Deficiency, Fractures) - Vitamin D, Ergocalciferol, (DRISDOL) 1.25 MG (50000 UNIT) CAPS capsule; Take 1 capsule (50,000 Units total) by mouth every 7 (seven) days.  Dispense: 4 capsule; Refill: 0  2. Pre-diabetes Increase Ozempic to 0.50 mg weekly. We will check labs today.  - Semaglutide,0.25 or 0.5MG /DOS, (OZEMPIC, 0.25 OR 0.5 MG/DOSE,) 2 MG/1.5ML SOPN; Inject 0.5 mg into the skin once a week.  Dispense: 1 each; Refill: 0 - Comprehensive metabolic panel - Hemoglobin A1c - Insulin, random  3. Other hyperlipidemia We  will check labs today. Lashunda will continue to work on diet, exercise and weight loss efforts. ord.  - Lipid Panel With LDL/HDL Ratio  4. Iron deficiency anemia, unspecified iron deficiency anemia type We will check labs today. Nekeisha will continue to follow up as directed.   - CBC with Differential/Platelet  5. Class 2 severe obesity with serious comorbidity and body mass index (BMI) of 37.0 to 37.9 in adult, unspecified obesity type [facility]) Justyne is currently in the action stage of change. As such, her  goal is to continue with weight loss efforts. She has agreed to keeping a food journal and adhering to recommended goals of [phone] calories and 80 grams of protein daily.   Exercise goals: As is.  Behavioral modification strategies: planning for success.  Beronica has agreed to follow-up with our clinic in 2 weeks.   Objective:   Blood pressure 117/69, pulse 88, temperature 98.3 F (36.8 C), height 4\' 11"  (1.499 m), weight 184 lb (83.5 kg), SpO2 100 %. Body mass index is 37.16 kg/m.  General: Cooperative, alert, well developed, in no acute distress. HEENT: Conjunctivae and lids unremarkable. Cardiovascular: Regular rhythm.  Lungs: Normal work of breathing. Neurologic: No focal deficits.   Lab Results  Component Value Date   CREATININE 0.72 11/03/2020   BUN 9 11/03/2020   NA 142 11/03/2020   K 4.4 11/03/2020   CL 102 11/03/2020   CO2 25 11/03/2020   Lab Results  Component Value Date   ALT 9 11/03/2020   AST 17 11/03/2020   ALKPHOS 121 11/03/2020   BILITOT 0.3 11/03/2020   Lab Results  Component Value Date   HGBA1C 5.8 (H) 11/03/2020   HGBA1C 5.9 (H) 06/23/2020   HGBA1C 5.6 04/01/2020   HGBA1C 5.7 (H) 09/17/2019   HGBA1C 5.9 (H) 06/04/2019   Lab Results  Component Value Date   INSULIN 9.6 11/03/2020   INSULIN 10.5 06/23/2020   INSULIN 12.2 04/01/2020   INSULIN 17.7 09/17/2019   INSULIN 10.7 06/04/2019   Lab Results  Component Value Date   TSH 1.620 06/04/2019   Lab Results  Component Value Date   CHOL 217 (H) 11/03/2020   HDL 73 11/03/2020   LDLCALC 121 (H) 11/03/2020   TRIG 134 11/03/2020   CHOLHDL 3 03/01/2018   Lab Results  Component Value Date   WBC 6.2 11/03/2020   HGB 13.4 11/03/2020   HCT 41.2 11/03/2020   MCV 81 11/03/2020   PLT 338 11/03/2020   No results found for: IRON, TIBC, FERRITIN  Obesity Behavioral Intervention:   Approximately 15 minutes were spent on the discussion below.  ASK: We discussed the diagnosis of obesity with  01/01/2021 today and Shelitha agreed to give [name] permission to discuss obesity behavioral modification therapy today.  ASSESS: Adonis has the diagnosis of obesity and her BMI today is 37.14. Anushree is in the action stage of change.   ADVISE: Sofya was educated on the multiple health risks of obesity as well as the benefit of weight loss to improve her health. She was advised of the need for long term treatment and the importance of lifestyle modifications to improve her current health and to decrease her risk of future health problems.  AGREE: Multiple dietary modification options and treatment options were discussed and Brystal agreed to follow the recommendations documented in the above note.  ARRANGE: Melvine was educated on the importance of frequent visits to treat obesity as outlined per CMS and USPSTF guidelines and agreed to schedule her  next follow up appointment today.  Attestation Statements:   Reviewed by clinician on day of visit: allergies, medications, problem list, medical history, surgical history, family history, social history, and previous encounter notes.   [name], am acting as Energy manager for [place], FNP-C.  I have reviewed the above documentation for accuracy and completeness, and I agree with the above. - Jesse Sans, FNP

## 2020-11-13 ENCOUNTER — Other Ambulatory Visit: Payer: Self-pay | Admitting: Family

## 2020-11-13 DIAGNOSIS — G44009 Cluster headache syndrome, unspecified, not intractable: Secondary | ICD-10-CM

## 2020-11-17 ENCOUNTER — Encounter (INDEPENDENT_AMBULATORY_CARE_PROVIDER_SITE_OTHER): Payer: Self-pay | Admitting: Family Medicine

## 2020-11-17 ENCOUNTER — Other Ambulatory Visit (INDEPENDENT_AMBULATORY_CARE_PROVIDER_SITE_OTHER): Payer: Self-pay | Admitting: Family Medicine

## 2020-11-17 ENCOUNTER — Ambulatory Visit (INDEPENDENT_AMBULATORY_CARE_PROVIDER_SITE_OTHER): Payer: Medicare HMO | Admitting: Family Medicine

## 2020-11-17 ENCOUNTER — Other Ambulatory Visit: Payer: Self-pay

## 2020-11-17 VITALS — BP 115/78 | HR 80 | Temp 97.4°F | Ht 59.0 in | Wt 183.0 lb

## 2020-11-17 DIAGNOSIS — Z6836 Body mass index (BMI) 36.0-36.9, adult: Secondary | ICD-10-CM | POA: Diagnosis not present

## 2020-11-17 DIAGNOSIS — R7303 Prediabetes: Secondary | ICD-10-CM | POA: Diagnosis not present

## 2020-11-17 DIAGNOSIS — E7849 Other hyperlipidemia: Secondary | ICD-10-CM

## 2020-11-17 MED ORDER — ATORVASTATIN CALCIUM 10 MG PO TABS: 10.0000 mg | ORAL_TABLET | ORAL | 0 refills | Status: DC

## 2020-11-17 NOTE — Progress Notes (Signed)
The 10-year ASCVD risk score Denman George DC Montez Hageman., et al., 2013) is: 9.2%   Values used to calculate the score:     Age: 70 years     Sex: Female     Is Non-Hispanic African American: Yes     Diabetic: No     Tobacco smoker: No     Systolic Blood Pressure: 115 mmHg     Is BP treated: Yes     HDL Cholesterol: 73 mg/dL     Total Cholesterol: 217 mg/dL

## 2020-11-18 NOTE — Progress Notes (Signed)
Chief Complaint:   OBESITY Darlene [name] is here to discuss her progress with her obesity treatment plan along with follow-up of her obesity related diagnoses. Darlene [name] is on keeping a food journal and adhering to recommended goals of [phone] calories and 80 grams of protein daily and states she is following her eating plan approximately 80% of the time. Darlene [name] states she is doing squats, arm press, and steps for 20 minutes 3 times per week.  Today's visit was #: 28 Starting weight: 213 lbs Starting date: 06/04/2019 Today's weight: 183 lbs Today's date: 11/17/2020 Total lbs lost to date: 30 Total lbs lost since last in-office visit: 1  Interim History: Darlene [name] is not always meeting her protein goals. She has had quite a bit of stress recently which has affected her appetite. She is down 1 lb today. Her goal is to lose to150 lbs (30 BMI).  Subjective:   1. Pre-diabetes Darlene [name] increased her dose of Ozempic as directed to 0.50 mg and she felt nauseated.   Lab Results  Component Value Date   HGBA1C 5.8 (H) 11/03/2020   Lab Results  Component Value Date   INSULIN 9.6 11/03/2020   INSULIN 10.5 06/23/2020   INSULIN 12.2 04/01/2020   INSULIN 17.7 09/17/2019   INSULIN 10.7 06/04/2019   2. Other hyperlipidemia Darlene [name]'s ASCVD risk score is 9.2%. She is not on statin, and her last LDL was 121.  I discussed labs with the patient today.  Lab Results  Component Value Date   ALT 9 11/03/2020   AST 17 11/03/2020   ALKPHOS 121 11/03/2020   BILITOT 0.3 11/03/2020   Lab Results  Component Value Date   CHOL 217 (H) 11/03/2020   HDL 73 11/03/2020   LDLCALC 121 (H) 11/03/2020   TRIG 134 11/03/2020   CHOLHDL 3 03/01/2018   Assessment/Plan:   1. Pre-diabetes Decrease dose of Ozempic to 0.25 mg weekly. She may increase to 0.25 mg plus 9 clicks (0.375 mg) if she has hunger.   2. Other hyperlipidemia Cardiovascular risk and specific lipid/LDL goals reviewed. Darlene [name] agreed to start  Lipitor 10 mg every other day with no refills.   - atorvastatin (LIPITOR) 10 MG tablet; Take 1 tablet (10 mg total) by mouth every other day.  Dispense: 15 tablet; Refill: 0  3. Class 2 severe obesity with serious comorbidity and body mass index (BMI) of 36.0 to 36.9 in adult, unspecified obesity type Darlene [name]) Darlene [name] is currently in the action stage of change. As such, her goal is to continue with weight loss efforts. She has agreed to keeping a food journal and adhering to recommended goals of [phone] calories and 80 grams of protein daily.   Exercise goals: As is.  Behavioral modification strategies: increasing lean protein intake.  Darlene [name] has agreed to follow-up with our clinic in 2 to 3 weeks.   Objective:   Blood pressure 115/78, pulse 80, temperature (!) 97.4 F (36.3 C), height 4\' 11"  (1.499 m), weight 183 lb (83 kg), SpO2 100 %. Body mass index is 36.96 kg/m.  General: Cooperative, alert, well developed, in no acute distress. HEENT: Conjunctivae and lids unremarkable. Cardiovascular: Regular rhythm.  Lungs: Normal work of breathing. Neurologic: No focal deficits.   Lab Results  Component Value Date   CREATININE 0.72 11/03/2020   BUN 9 11/03/2020   NA 142 11/03/2020   K 4.4 11/03/2020   CL 102 11/03/2020   CO2 25 11/03/2020   Lab Results  Component Value Date   ALT  9 11/03/2020   AST 17 11/03/2020   ALKPHOS 121 11/03/2020   BILITOT 0.3 11/03/2020   Lab Results  Component Value Date   HGBA1C 5.8 (H) 11/03/2020   HGBA1C 5.9 (H) 06/23/2020   HGBA1C 5.6 04/01/2020   HGBA1C 5.7 (H) 09/17/2019   HGBA1C 5.9 (H) 06/04/2019   Lab Results  Component Value Date   INSULIN 9.6 11/03/2020   INSULIN 10.5 06/23/2020   INSULIN 12.2 04/01/2020   INSULIN 17.7 09/17/2019   INSULIN 10.7 06/04/2019   Lab Results  Component Value Date   TSH 1.620 06/04/2019   Lab Results  Component Value Date   CHOL 217 (H) 11/03/2020   HDL 73 11/03/2020   LDLCALC 121 (H) 11/03/2020    TRIG 134 11/03/2020   CHOLHDL 3 03/01/2018   Lab Results  Component Value Date   WBC 6.2 11/03/2020   HGB 13.4 11/03/2020   HCT 41.2 11/03/2020   MCV 81 11/03/2020   PLT 338 11/03/2020   No results found for: IRON, TIBC, FERRITIN  Obesity Behavioral Intervention:   Approximately 15 minutes were spent on the discussion below.  ASK: We discussed the diagnosis of obesity with Darlene [name] today and Darlene [name] agreed to give [place] permission to discuss obesity behavioral modification therapy today.  ASSESS: Darlene [name] has the diagnosis of obesity and her BMI today is 36.94. Darlene [name] is in the action stage of change.   ADVISE: Darlene [name] was educated on the multiple health risks of obesity as well as the benefit of weight loss to improve her health. She was advised of the need for long term treatment and the importance of lifestyle modifications to improve her current health and to decrease her risk of future health problems.  AGREE: Multiple dietary modification options and treatment options were discussed and Darlene [name] agreed to follow the recommendations documented in the above note.  ARRANGE: Darlene [name] was educated on the importance of frequent visits to treat obesity as outlined per CMS and USPSTF guidelines and agreed to schedule her next follow up appointment today.  Attestation Statements:   Reviewed by clinician on day of visit: allergies, medications, problem list, medical history, surgical history, family history, social history, and previous encounter notes.   Darlene [name], am acting as Energy manager for [place], FNP-C.  I have reviewed the above documentation for accuracy and completeness, and I agree with the above. -  Darlene Sans, FNP

## 2020-11-19 ENCOUNTER — Encounter (INDEPENDENT_AMBULATORY_CARE_PROVIDER_SITE_OTHER): Payer: Self-pay | Admitting: Family Medicine

## 2020-12-01 ENCOUNTER — Ambulatory Visit (INDEPENDENT_AMBULATORY_CARE_PROVIDER_SITE_OTHER): Payer: Medicare HMO | Admitting: Family Medicine

## 2020-12-03 ENCOUNTER — Ambulatory Visit: Payer: Medicare HMO

## 2020-12-04 ENCOUNTER — Other Ambulatory Visit (INDEPENDENT_AMBULATORY_CARE_PROVIDER_SITE_OTHER): Payer: Self-pay | Admitting: Family Medicine

## 2020-12-04 ENCOUNTER — Inpatient Hospital Stay: Admission: RE | Admit: 2020-12-04 | Payer: Medicare HMO | Source: Ambulatory Visit

## 2020-12-04 DIAGNOSIS — R7303 Prediabetes: Secondary | ICD-10-CM

## 2020-12-07 NOTE — Telephone Encounter (Signed)
Last OV with Dawn 

## 2020-12-07 NOTE — Telephone Encounter (Signed)
Refill request

## 2020-12-08 ENCOUNTER — Other Ambulatory Visit: Payer: Self-pay

## 2020-12-08 ENCOUNTER — Ambulatory Visit (INDEPENDENT_AMBULATORY_CARE_PROVIDER_SITE_OTHER): Payer: Medicare HMO | Admitting: Podiatry

## 2020-12-08 DIAGNOSIS — B351 Tinea unguium: Secondary | ICD-10-CM

## 2020-12-08 DIAGNOSIS — S96912D Strain of unspecified muscle and tendon at ankle and foot level, left foot, subsequent encounter: Secondary | ICD-10-CM | POA: Diagnosis not present

## 2020-12-08 MED ORDER — CICLOPIROX 8 % EX SOLN: Freq: Every day | CUTANEOUS | 2 refills | Status: DC

## 2020-12-08 NOTE — Patient Instructions (Signed)
Ciclopirox nail solution What is this medicine? CICLOPIROX (sye kloe PEER ox) NAIL SOLUTION is an antifungal medicine. It used to treat fungal infections of the nails. This medicine may be used for other purposes; ask your health care provider or pharmacist if you have questions. COMMON BRAND NAME(S): [id number], Penlac What should I tell my health care provider before I take this medicine? They need to know if you have any of these conditions:  diabetes mellitus  history of seizures  HIV infection  immune system problems or organ transplant  large areas of burned or damaged skin  peripheral vascular disease or poor circulation  taking corticosteroid medication (including steroid inhalers, cream, or lotion)  an unusual or allergic reaction to ciclopirox, isopropyl alcohol, other medicines, foods, dyes, or preservatives  pregnant or trying to get pregnant  breast-feeding How should I use this medicine? This medicine is for external use only. Follow the directions that come with this medicine exactly. Wash and dry your hands before use. Avoid contact with the eyes, mouth or nose. If you do get this medicine in your eyes, rinse out with plenty of cool tap water. Contact your doctor or health care professional if eye irritation occurs. Use at regular intervals. Do not use your medicine more often than directed. Finish the full course prescribed by your doctor or health care professional even if you think you are better. Do not stop using except on your doctor's advice. Talk to your pediatrician regarding the use of this medicine in children. While this medicine may be prescribed for children as young as 12 years for selected conditions, precautions do apply. Overdosage: If you think you have taken too much of this medicine contact a poison control center or emergency room at once. NOTE: This medicine is only for you. Do not share this medicine with others. What if I miss a dose? If you miss a  dose, use it as soon as you can. If it is almost time for your next dose, use only that dose. Do not use double or extra doses. What may interact with this medicine? Interactions are not expected. Do not use any other skin products without telling your doctor or health care professional. This list may not describe all possible interactions. Give your health care provider a list of all the medicines, herbs, non-prescription drugs, or dietary supplements you use. Also tell them if you smoke, drink alcohol, or use illegal drugs. Some items may interact with your medicine. What should I watch for while using this medicine? Tell your doctor or health care professional if your symptoms get worse. Four to six months of treatment may be needed for the nail(s) to improve. Some people may not achieve a complete cure or clearing of the nails by this time. Tell your doctor or health care professional if you develop sores or blisters that do not heal properly. If your nail infection returns after stopping using this product, contact your doctor or health care professional. What side effects may I notice from receiving this medicine? Side effects that you should report to your doctor or health care professional as soon as possible:  allergic reactions like skin rash, itching or hives, swelling of the face, lips, or tongue  severe irritation, redness, burning, blistering, peeling, swelling, oozing Side effects that usually do not require medical attention (report to your doctor or health care professional if they continue or are bothersome):  mild reddening of the skin  nail discoloration  temporary burning or mild   stinging at the site of application This list may not describe all possible side effects. Call your doctor for medical advice about side effects. You may report side effects to FDA at 1-800-FDA-1088. Where should I keep my medicine? Keep out of the reach of children. Store at room temperature  between 15 and 30 degrees C (59 and 86 degrees F). Do not freeze. Protect from light by storing the bottle in the carton after every use. This medicine is flammable. Keep away from heat and flame. Throw away any unused medicine after the expiration date. NOTE: This sheet is a summary. It may not cover all possible information. If you have questions about this medicine, talk to your doctor, pharmacist, or health care provider.  2021 Elsevier/Gold Standard (2007-12-17 16:49:20)  

## 2020-12-15 ENCOUNTER — Other Ambulatory Visit: Payer: Self-pay | Admitting: Family

## 2020-12-15 ENCOUNTER — Other Ambulatory Visit: Payer: Medicare HMO

## 2020-12-15 ENCOUNTER — Encounter (INDEPENDENT_AMBULATORY_CARE_PROVIDER_SITE_OTHER): Payer: Self-pay | Admitting: Family Medicine

## 2020-12-15 ENCOUNTER — Ambulatory Visit (INDEPENDENT_AMBULATORY_CARE_PROVIDER_SITE_OTHER): Payer: Medicare HMO | Admitting: Family Medicine

## 2020-12-15 ENCOUNTER — Other Ambulatory Visit: Payer: Self-pay

## 2020-12-15 VITALS — BP 125/75 | HR 70 | Temp 97.6°F | Ht 59.0 in | Wt 185.0 lb

## 2020-12-15 DIAGNOSIS — E559 Vitamin D deficiency, unspecified: Secondary | ICD-10-CM

## 2020-12-15 DIAGNOSIS — Z6841 Body Mass Index (BMI) 40.0 and over, adult: Secondary | ICD-10-CM | POA: Diagnosis not present

## 2020-12-15 DIAGNOSIS — R52 Pain, unspecified: Secondary | ICD-10-CM

## 2020-12-15 DIAGNOSIS — R7303 Prediabetes: Secondary | ICD-10-CM

## 2020-12-15 MED ORDER — VITAMIN D (ERGOCALCIFEROL) 1.25 MG (50000 UNIT) PO CAPS: 50000.0000 [IU] | ORAL_CAPSULE | ORAL | 0 refills | Status: DC

## 2020-12-15 NOTE — Progress Notes (Signed)
Subjective: 70 year old female presents the office today for follow-up evaluation of left tendinitis, partial tear posterior tibial tendon.  She states that overall she is feeling much better not had any pain or swelling.  Only concern today is about her left big toenail becoming somewhat thickened discolored but not causing any pain to the nail.  Denies any redness or drainage or any swelling.Denies any systemic complaints such as fevers, chills, nausea, vomiting. No acute changes since last appointment, and no other complaints at this time.   Objective: AAO x3, NAD DP/PT pulses palpable bilaterally, CRT less than 3 seconds On today's exam there is no tenderness palpation the course the posterior tibial tendon.  There is no pain on flexion, extensor tendons.  No edema, erythema.  Tendons appear to be intact.  MMT 5/5. Left hallux toenails hypertrophic, dystrophic with yellow-brown discoloration.  There is no pain in the nail there is no redness or drainage or signs of infection noted today otherwise. No pain with calf compression, swelling, warmth, erythema  Assessment: Resolving posterior tibial partial tear; onychomycosis  Plan: -All treatment options discussed with the patient including all alternatives, risks, complications.  -Continue with home rehab exercises for the ankle.  Continue supportive shoes as well as well as good arch supports. -Prescribed Penlac for the nail fungus -Patient encouraged to call the office with any questions, concerns, change in symptoms.   Vivi Barrack DPM

## 2020-12-16 ENCOUNTER — Encounter (INDEPENDENT_AMBULATORY_CARE_PROVIDER_SITE_OTHER): Payer: Self-pay | Admitting: Family Medicine

## 2020-12-16 NOTE — Progress Notes (Signed)
Chief Complaint:   OBESITY Kristell is here to discuss her progress with her obesity treatment plan along with follow-up of her obesity related diagnoses. Mazzie is on keeping a food journal and adhering to recommended goals of [phone] calories and 80 g protein and states she is following her eating plan approximately 65% of the time. Arelys states she is stretching 60 minutes 3 times per week.  Today's visit was #: 29 Starting weight: 213 lbs Starting date: 06/04/2019 Today's weight: 185 lbs Today's date: 12/15/2020 Total lbs lost to date: 28 lbs Total lbs lost since last in-office visit: 0  Interim History: Holy did have a recent celebration and went over on calories. Overall, her calories have been averaging 1200-1500 cal/day. She is not meeting protein goals. She is having more hunger and cravings. Her goal is to lose to 175 lbs by October 2022 and have her Rt.total knee replacement.  Subjective:   1. Vitamin D deficiency Susa's Vitamin D level was 46.0 on 11/03/2020. She is currently taking prescription vitamin D 50,000 IU each week.    Ref. Range 11/03/2020 12:22  Vitamin D, 25-Hydroxy Latest Ref Range: 30.0 - 100.0 ng/mL 46.0   2. Pre-diabetes Algie is tolerating Ozempic 0.5 mg. However, she feels it is not working as well as it was. Her last A1c was 5.8.  Lab Results  Component Value Date   HGBA1C 5.8 (H) 11/03/2020   Lab Results  Component Value Date   INSULIN 9.6 11/03/2020   INSULIN 10.5 06/23/2020   INSULIN 12.2 04/01/2020   INSULIN 17.7 09/17/2019   INSULIN 10.7 06/04/2019    Assessment/Plan:   1. Vitamin D deficiency  She agrees to continue to take prescription Vitamin D @50 ,000 IU every week and will follow-up for routine testing of Vitamin D, at least 2-3 times per year to avoid over-replacement.  - Vitamin D, Ergocalciferol, (DRISDOL) 1.25 MG (50000 UNIT) CAPS capsule; Take 1 capsule (50,000 Units total) by mouth every 7 (seven) days.  Dispense:  4 capsule; Refill: 0  2. Pre-diabetes  Continue Ozempic.  3. Class 2 severe obesity with serious comorbidity and body mass index (BMI) of 37.0 to 37.9 in adult, unspecified obesity type (HCC) Natacia is currently in the action stage of change. As such, her goal is to continue with weight loss efforts. She has agreed to keeping a food journal and adhering to recommended goals of [phone] calories and 80 g protein.   Handout: Recipes II  Exercise goals: As is- continuewater therapy  Behavioral modification strategies: increasing lean protein intake and meal planning and cooking strategies.  Jasamine has agreed to follow-up with our clinic in 2-3 weeks.  Objective:   Blood pressure 125/75, pulse 70, temperature 97.6 F (36.4 C), height 4\' 11"  (1.499 m), weight 185 lb (83.9 kg), SpO2 97 %. Body mass index is 37.37 kg/m.  General: Cooperative, alert, well developed, in no acute distress. HEENT: Conjunctivae and lids unremarkable. Cardiovascular: Regular rhythm.  Lungs: Normal work of breathing. Neurologic: No focal deficits.   Lab Results  Component Value Date   CREATININE 0.72 11/03/2020   BUN 9 11/03/2020   NA 142 11/03/2020   K 4.4 11/03/2020   CL 102 11/03/2020   CO2 25 11/03/2020   Lab Results  Component Value Date   ALT 9 11/03/2020   AST 17 11/03/2020   ALKPHOS 121 11/03/2020   BILITOT 0.3 11/03/2020   Lab Results  Component Value Date   HGBA1C 5.8 (H) 11/03/2020  HGBA1C 5.9 (H) 06/23/2020   HGBA1C 5.6 04/01/2020   HGBA1C 5.7 (H) 09/17/2019   HGBA1C 5.9 (H) 06/04/2019   Lab Results  Component Value Date   INSULIN 9.6 11/03/2020   INSULIN 10.5 06/23/2020   INSULIN 12.2 04/01/2020   INSULIN 17.7 09/17/2019   INSULIN 10.7 06/04/2019   Lab Results  Component Value Date   TSH 1.620 06/04/2019   Lab Results  Component Value Date   CHOL 217 (H) 11/03/2020   HDL 73 11/03/2020   LDLCALC 121 (H) 11/03/2020   TRIG 134 11/03/2020   CHOLHDL 3 03/01/2018    Lab Results  Component Value Date   WBC 6.2 11/03/2020   HGB 13.4 11/03/2020   HCT 41.2 11/03/2020   MCV 81 11/03/2020   PLT 338 11/03/2020   No results found for: IRON, TIBC, FERRITIN  Obesity Behavioral Intervention:   Approximately 15 minutes were spent on the discussion below.  ASK: We discussed the diagnosis of obesity with [name] today and Shavelle agreed to give [place] permission to discuss obesity behavioral modification therapy today.  ASSESS: Sharion has the diagnosis of obesity and her BMI today is 37.5. Aamiyah is in the action stage of change.   ADVISE: Xoie was educated on the multiple health risks of obesity as well as the benefit of weight loss to improve her health. She was advised of the need for long term treatment and the importance of lifestyle modifications to improve her current health and to decrease her risk of future health problems.  AGREE: Multiple dietary modification options and treatment options were discussed and Aubreanna agreed to follow the recommendations documented in the above note.  ARRANGE: Jordie was educated on the importance of frequent visits to treat obesity as outlined per CMS and USPSTF guidelines and agreed to schedule her next follow up appointment today.  Attestation Statements:   Reviewed by clinician on day of visit: allergies, medications, problem list, medical history, surgical history, family history, social history, and previous encounter notes.  Edmund Hilda, am acting as Energy manager for [place], FNP.  I have reviewed the above documentation for accuracy and completeness, and I agree with the above. -  Jesse Sans, FNP

## 2020-12-21 ENCOUNTER — Ambulatory Visit
Admission: RE | Admit: 2020-12-21 | Discharge: 2020-12-21 | Disposition: A | Discharge status: Home / Self Care | Payer: Medicare HMO | Source: Ambulatory Visit | Attending: Family | Admitting: Family

## 2020-12-21 ENCOUNTER — Other Ambulatory Visit: Payer: Self-pay

## 2020-12-21 DIAGNOSIS — G44009 Cluster headache syndrome, unspecified, not intractable: Secondary | ICD-10-CM

## 2020-12-24 ENCOUNTER — Other Ambulatory Visit (INDEPENDENT_AMBULATORY_CARE_PROVIDER_SITE_OTHER): Payer: Self-pay | Admitting: Family Medicine

## 2020-12-24 DIAGNOSIS — E7849 Other hyperlipidemia: Secondary | ICD-10-CM

## 2020-12-24 NOTE — Telephone Encounter (Signed)
Refill request

## 2020-12-30 DIAGNOSIS — M545 Low back pain, unspecified: Secondary | ICD-10-CM | POA: Diagnosis not present

## 2020-12-30 DIAGNOSIS — M25512 Pain in left shoulder: Secondary | ICD-10-CM | POA: Diagnosis not present

## 2020-12-30 DIAGNOSIS — M25511 Pain in right shoulder: Secondary | ICD-10-CM | POA: Diagnosis not present

## 2020-12-30 DIAGNOSIS — M25532 Pain in left wrist: Secondary | ICD-10-CM | POA: Diagnosis not present

## 2020-12-30 DIAGNOSIS — M25531 Pain in right wrist: Secondary | ICD-10-CM | POA: Diagnosis not present

## 2020-12-30 DIAGNOSIS — S63502D Unspecified sprain of left wrist, subsequent encounter: Secondary | ICD-10-CM | POA: Diagnosis not present

## 2021-01-01 DIAGNOSIS — M25561 Pain in right knee: Secondary | ICD-10-CM | POA: Diagnosis not present

## 2021-01-01 DIAGNOSIS — M25512 Pain in left shoulder: Secondary | ICD-10-CM | POA: Diagnosis not present

## 2021-01-01 DIAGNOSIS — M25511 Pain in right shoulder: Secondary | ICD-10-CM | POA: Diagnosis not present

## 2021-01-01 DIAGNOSIS — M545 Low back pain, unspecified: Secondary | ICD-10-CM | POA: Diagnosis not present

## 2021-01-01 DIAGNOSIS — M25562 Pain in left knee: Secondary | ICD-10-CM | POA: Diagnosis not present

## 2021-01-05 ENCOUNTER — Encounter (INDEPENDENT_AMBULATORY_CARE_PROVIDER_SITE_OTHER): Payer: Self-pay | Admitting: Family Medicine

## 2021-01-05 ENCOUNTER — Other Ambulatory Visit: Payer: Self-pay

## 2021-01-05 ENCOUNTER — Telehealth (INDEPENDENT_AMBULATORY_CARE_PROVIDER_SITE_OTHER): Payer: Medicare HMO | Admitting: Family Medicine

## 2021-01-05 VITALS — BP 127/80 | Ht 59.0 in | Wt 185.0 lb

## 2021-01-05 DIAGNOSIS — Z6841 Body Mass Index (BMI) 40.0 and over, adult: Secondary | ICD-10-CM | POA: Diagnosis not present

## 2021-01-05 DIAGNOSIS — E559 Vitamin D deficiency, unspecified: Secondary | ICD-10-CM | POA: Diagnosis not present

## 2021-01-05 DIAGNOSIS — R7303 Prediabetes: Secondary | ICD-10-CM | POA: Diagnosis not present

## 2021-01-05 MED ORDER — VITAMIN D (ERGOCALCIFEROL) 1.25 MG (50000 UNIT) PO CAPS: 50000.0000 [IU] | ORAL_CAPSULE | ORAL | 0 refills | Status: DC

## 2021-01-06 ENCOUNTER — Encounter (INDEPENDENT_AMBULATORY_CARE_PROVIDER_SITE_OTHER): Payer: Self-pay | Admitting: Family Medicine

## 2021-01-06 DIAGNOSIS — M25511 Pain in right shoulder: Secondary | ICD-10-CM | POA: Diagnosis not present

## 2021-01-06 DIAGNOSIS — M25561 Pain in right knee: Secondary | ICD-10-CM | POA: Diagnosis not present

## 2021-01-06 DIAGNOSIS — M25531 Pain in right wrist: Secondary | ICD-10-CM | POA: Diagnosis not present

## 2021-01-06 DIAGNOSIS — S63502D Unspecified sprain of left wrist, subsequent encounter: Secondary | ICD-10-CM | POA: Diagnosis not present

## 2021-01-06 DIAGNOSIS — M25532 Pain in left wrist: Secondary | ICD-10-CM | POA: Diagnosis not present

## 2021-01-06 DIAGNOSIS — M25512 Pain in left shoulder: Secondary | ICD-10-CM | POA: Diagnosis not present

## 2021-01-06 DIAGNOSIS — M545 Low back pain, unspecified: Secondary | ICD-10-CM | POA: Diagnosis not present

## 2021-01-06 DIAGNOSIS — M25562 Pain in left knee: Secondary | ICD-10-CM | POA: Diagnosis not present

## 2021-01-06 NOTE — Progress Notes (Signed)
TeleHealth Visit:  Due to the COVID-19 pandemic, this visit was completed with telemedicine (audio/video) technology to reduce patient and provider exposure as well as to preserve personal protective equipment.   Darlene [name] has verbally consented to this TeleHealth visit. The patient is located at home, the provider is located at the [facility] office. The participants in this visit include the listed provider and patient. The visit was conducted today via telephone (22 min call).  Darlene [name] was unable to use realtime audiovisual technology today and the telehealth visit was conducted via telephone.  Chief Complaint: OBESITY Darlene [name] is here to discuss her progress with her obesity treatment plan along with follow-up of her obesity related diagnoses. Darlene [name] is on keeping a food journal and adhering to recommended goals of [phone] calories and 80 g protein and states she is following her eating plan approximately 80% of the time. Darlene [name] states she is walking and squatting 15 minutes 3 times per week.  Today's visit was #: 30 Starting weight: 213 lbs Starting date: 06/04/2019  Interim History: Darlene [name] says she has lost some weight. Her home weight is 185 lbs. She is journaling daily. She says calories have been low the past few days (about 950). She has been reaching protein goals. Her water intake is good. She is frustrated with slow weight loss.  Subjective:   1. Vitamin D deficiency Darlene [name]'s Vitamin D level was nearly at goal 46 on 11/03/2020. She is currently taking prescription vitamin D 50,000 IU each week.  2. Pre-diabetes Darlene [name]'s last A1c was 5.8. She is on Ozempic 0.5 mg weekly. She notes constipation.  Lab Results  Component Value Date   HGBA1C 5.8 (H) 11/03/2020   Lab Results  Component Value Date   INSULIN 9.6 11/03/2020   INSULIN 10.5 06/23/2020   INSULIN 12.2 04/01/2020   INSULIN 17.7 09/17/2019   INSULIN 10.7 06/04/2019    Assessment/Plan:   1.  Vitamin D deficiency  She agrees to continue to take prescription Vitamin D @50 ,000 IU every week and will follow-up for routine testing of Vitamin D, at least 2-3 times per year to avoid over-replacement.  - Vitamin D, Ergocalciferol, (DRISDOL) 1.25 MG (50000 UNIT) CAPS capsule; Take 1 capsule (50,000 Units total) by mouth every 7 (seven) days.  Dispense: 4 capsule; Refill: 0  2. Pre-diabetes . Continue Ozempic. Take Miralax daily and increase water intake.  3. Obesity: Current BMI 37 Darlene [name] is currently in the action stage of change. As such, her goal is to continue with weight loss efforts. She has agreed to the Category 3 Plan and keeping a food journal and adhering to recommended goals of [phone] calories and 80 protein.   Encouragement provided  Exercise goals: Do 30 minutes 3 times a week  Behavioral modification strategies: meal planning and cooking strategies.  Darlene [name] has agreed to follow-up with our clinic in 3 weeks.  Objective:   VITALS: Per patient if applicable, see vitals. GENERAL: Alert and in no acute distress. CARDIOPULMONARY: No increased WOB. Speaking in clear sentences.  PSYCH: Pleasant and cooperative. Speech normal rate and rhythm. Affect is appropriate. Insight and judgement are appropriate. Attention is focused, linear, and appropriate.  NEURO: Oriented as arrived to appointment on time with no prompting.   Lab Results  Component Value Date   CREATININE 0.72 11/03/2020   BUN 9 11/03/2020   NA 142 11/03/2020   K 4.4 11/03/2020   CL 102 11/03/2020   CO2 25 11/03/2020   Lab Results  Component Value Date   ALT 9 11/03/2020   AST 17 11/03/2020   ALKPHOS 121 11/03/2020   BILITOT 0.3 11/03/2020   Lab Results  Component Value Date   HGBA1C 5.8 (H) 11/03/2020   HGBA1C 5.9 (H) 06/23/2020   HGBA1C 5.6 04/01/2020   HGBA1C 5.7 (H) 09/17/2019   HGBA1C 5.9 (H) 06/04/2019   Lab Results  Component Value Date   INSULIN 9.6 11/03/2020   INSULIN 10.5  06/23/2020   INSULIN 12.2 04/01/2020   INSULIN 17.7 09/17/2019   INSULIN 10.7 06/04/2019   Lab Results  Component Value Date   TSH 1.620 06/04/2019   Lab Results  Component Value Date   CHOL 217 (H) 11/03/2020   HDL 73 11/03/2020   LDLCALC 121 (H) 11/03/2020   TRIG 134 11/03/2020   CHOLHDL 3 03/01/2018   Lab Results  Component Value Date   WBC 6.2 11/03/2020   HGB 13.4 11/03/2020   HCT 41.2 11/03/2020   MCV 81 11/03/2020   PLT 338 11/03/2020   No results found for: IRON, TIBC, FERRITIN  Attestation Statements:   Reviewed by clinician on day of visit: allergies, medications, problem list, medical history, surgical history, family history, social history, and previous encounter notes.  Edmund Hilda, am acting as Energy manager for [place], FNP.  I have reviewed the above documentation for accuracy and completeness, and I agree with the above. - Jesse Sans, FNP

## 2021-01-08 DIAGNOSIS — M1711 Unilateral primary osteoarthritis, right knee: Secondary | ICD-10-CM | POA: Diagnosis not present

## 2021-01-11 ENCOUNTER — Other Ambulatory Visit (INDEPENDENT_AMBULATORY_CARE_PROVIDER_SITE_OTHER): Payer: Self-pay | Admitting: Family Medicine

## 2021-01-11 DIAGNOSIS — R7303 Prediabetes: Secondary | ICD-10-CM

## 2021-01-11 NOTE — Telephone Encounter (Signed)
Refill request

## 2021-01-13 DIAGNOSIS — M25562 Pain in left knee: Secondary | ICD-10-CM | POA: Diagnosis not present

## 2021-01-13 DIAGNOSIS — M25512 Pain in left shoulder: Secondary | ICD-10-CM | POA: Diagnosis not present

## 2021-01-13 DIAGNOSIS — M25561 Pain in right knee: Secondary | ICD-10-CM | POA: Diagnosis not present

## 2021-01-13 DIAGNOSIS — M545 Low back pain, unspecified: Secondary | ICD-10-CM | POA: Diagnosis not present

## 2021-01-13 DIAGNOSIS — M25511 Pain in right shoulder: Secondary | ICD-10-CM | POA: Diagnosis not present

## 2021-01-14 DIAGNOSIS — S63502D Unspecified sprain of left wrist, subsequent encounter: Secondary | ICD-10-CM | POA: Diagnosis not present

## 2021-01-14 DIAGNOSIS — M25532 Pain in left wrist: Secondary | ICD-10-CM | POA: Diagnosis not present

## 2021-01-14 DIAGNOSIS — M25531 Pain in right wrist: Secondary | ICD-10-CM | POA: Diagnosis not present

## 2021-01-20 DIAGNOSIS — M1812 Unilateral primary osteoarthritis of first carpometacarpal joint, left hand: Secondary | ICD-10-CM | POA: Diagnosis not present

## 2021-01-20 DIAGNOSIS — S63502D Unspecified sprain of left wrist, subsequent encounter: Secondary | ICD-10-CM | POA: Diagnosis not present

## 2021-01-20 DIAGNOSIS — M25532 Pain in left wrist: Secondary | ICD-10-CM | POA: Diagnosis not present

## 2021-01-22 DIAGNOSIS — M25562 Pain in left knee: Secondary | ICD-10-CM | POA: Diagnosis not present

## 2021-01-22 DIAGNOSIS — M25512 Pain in left shoulder: Secondary | ICD-10-CM | POA: Diagnosis not present

## 2021-01-22 DIAGNOSIS — M25561 Pain in right knee: Secondary | ICD-10-CM | POA: Diagnosis not present

## 2021-01-22 DIAGNOSIS — M25511 Pain in right shoulder: Secondary | ICD-10-CM | POA: Diagnosis not present

## 2021-01-22 DIAGNOSIS — M545 Low back pain, unspecified: Secondary | ICD-10-CM | POA: Diagnosis not present

## 2021-01-25 ENCOUNTER — Ambulatory Visit: Payer: Medicare HMO

## 2021-01-26 DIAGNOSIS — Z1231 Encounter for screening mammogram for malignant neoplasm of breast: Secondary | ICD-10-CM | POA: Diagnosis not present

## 2021-01-27 DIAGNOSIS — S63502D Unspecified sprain of left wrist, subsequent encounter: Secondary | ICD-10-CM | POA: Diagnosis not present

## 2021-01-27 DIAGNOSIS — M25532 Pain in left wrist: Secondary | ICD-10-CM | POA: Diagnosis not present

## 2021-01-27 DIAGNOSIS — M25531 Pain in right wrist: Secondary | ICD-10-CM | POA: Diagnosis not present

## 2021-01-28 ENCOUNTER — Encounter (INDEPENDENT_AMBULATORY_CARE_PROVIDER_SITE_OTHER): Payer: Self-pay | Admitting: Family Medicine

## 2021-01-28 ENCOUNTER — Other Ambulatory Visit: Payer: Self-pay

## 2021-01-28 ENCOUNTER — Ambulatory Visit (INDEPENDENT_AMBULATORY_CARE_PROVIDER_SITE_OTHER): Payer: Medicare HMO | Admitting: Family Medicine

## 2021-01-28 VITALS — BP 114/74 | HR 72 | Temp 98.3°F | Ht 59.0 in | Wt 184.0 lb

## 2021-01-28 DIAGNOSIS — I10 Essential (primary) hypertension: Secondary | ICD-10-CM | POA: Diagnosis not present

## 2021-01-28 DIAGNOSIS — Z6841 Body Mass Index (BMI) 40.0 and over, adult: Secondary | ICD-10-CM

## 2021-01-28 DIAGNOSIS — E66813 Obesity, class 3: Secondary | ICD-10-CM

## 2021-01-28 DIAGNOSIS — E785 Hyperlipidemia, unspecified: Secondary | ICD-10-CM | POA: Diagnosis not present

## 2021-01-28 DIAGNOSIS — R7303 Prediabetes: Secondary | ICD-10-CM | POA: Diagnosis not present

## 2021-01-28 DIAGNOSIS — B351 Tinea unguium: Secondary | ICD-10-CM | POA: Diagnosis not present

## 2021-01-28 DIAGNOSIS — E559 Vitamin D deficiency, unspecified: Secondary | ICD-10-CM

## 2021-01-28 DIAGNOSIS — L309 Dermatitis, unspecified: Secondary | ICD-10-CM | POA: Diagnosis not present

## 2021-01-28 DIAGNOSIS — Z6838 Body mass index (BMI) 38.0-38.9, adult: Secondary | ICD-10-CM | POA: Diagnosis not present

## 2021-01-28 DIAGNOSIS — K5900 Constipation, unspecified: Secondary | ICD-10-CM | POA: Diagnosis not present

## 2021-01-28 DIAGNOSIS — Z833 Family history of diabetes mellitus: Secondary | ICD-10-CM | POA: Diagnosis not present

## 2021-01-28 DIAGNOSIS — K047 Periapical abscess without sinus: Secondary | ICD-10-CM | POA: Diagnosis not present

## 2021-01-28 DIAGNOSIS — K08409 Partial loss of teeth, unspecified cause, unspecified class: Secondary | ICD-10-CM | POA: Diagnosis not present

## 2021-01-28 MED ORDER — VITAMIN D (ERGOCALCIFEROL) 1.25 MG (50000 UNIT) PO CAPS: 50000.0000 [IU] | ORAL_CAPSULE | ORAL | 0 refills | Status: DC

## 2021-02-01 ENCOUNTER — Encounter (INDEPENDENT_AMBULATORY_CARE_PROVIDER_SITE_OTHER): Payer: Self-pay | Admitting: Family Medicine

## 2021-02-01 NOTE — Progress Notes (Signed)
Chief Complaint:   OBESITY Darlene [name] is here to discuss her progress with her obesity treatment plan along with follow-up of her obesity related diagnoses. Darlene [name] is on the Category 3 Plan or keeping a food journal and adhering to recommended goals of [phone] calories and 90 grams of protein daily and states she is following her eating plan approximately 70-75% of the time. Darlene [name] states she is walking and doing stretches for 20 minutes 3 times per week.  Today's visit was #: 31 Starting weight: 213 lbs Starting date: 06/04/2019 Today's weight: 184 lbs Today's date: 01/28/2021 Total lbs lost to date: 29 Total lbs lost since last in-office visit: 1  Interim History: Darlene [name] says she is tired of all of the food and tired of keeping track of calories. She is frustrated that this process is taking so long. I pointed out that she has been doing very well- losing nearly 30 lb over 1.5 years with a slow metabolism. She wants to make [place] and beans with rice.  Subjective:   1. Vitamin D deficiency Darlene [name]'s last Vit D level was almost at goal of 50 (46). She is on weekly prescription Vit D.  2. Pre-diabetes Darlene [name]'s last A1c was 5.8. She is on Ozempic 0.5 mg, and she notes good control of hunger.   Lab Results  Component Value Date   HGBA1C 5.8 (H) 11/03/2020   Lab Results  Component Value Date   INSULIN 9.6 11/03/2020   INSULIN 10.5 06/23/2020   INSULIN 12.2 04/01/2020   INSULIN 17.7 09/17/2019   INSULIN 10.7 06/04/2019   Assessment/Plan:   1. Vitamin D deficiency  We will refill prescription Vitamin D for 1 month. Darlene [name] will follow-up for routine testing of Vitamin D, at least 2-3 times per year to avoid over-replacement.  - Vitamin D, Ergocalciferol, (DRISDOL) 1.25 MG (50000 UNIT) CAPS capsule; Take 1 capsule (50,000 Units total) by mouth every 7 (seven) days.  Dispense: 4 capsule; Refill: 0  2. Pre-diabetes Darlene [name] will continue Ozempic at 0.5 mg, and will continue to  work on weight loss, exercise, and decreasing simple carbohydrates to help decrease the risk of diabetes.   3. Obesity: Current BMI 37.14 Darlene [name] is currently in the action stage of change. As such, her goal is to continue with weight loss efforts. She has agreed to practicing portion control and making smarter food choices, such as increasing vegetables and decreasing simple carbohydrates.   Reassurance was provided.  Exercise goals: She will look into joining the gym through Darlene [name].  Behavioral modification strategies: decreasing simple carbohydrates.  Darlene [name] has agreed to follow-up with our clinic in 3 weeks.   Objective:   Blood pressure 114/74, pulse 72, temperature 98.3 F (36.8 C), height 4\' 11"  (1.499 m), weight 184 lb (83.5 kg), SpO2 97 %. Body mass index is 37.16 kg/m.  General: Cooperative, alert, well developed, in no acute distress. HEENT: Conjunctivae and lids unremarkable. Cardiovascular: Regular rhythm.  Lungs: Normal work of breathing. Neurologic: No focal deficits.   Lab Results  Component Value Date   CREATININE 0.72 11/03/2020   BUN 9 11/03/2020   NA 142 11/03/2020   K 4.4 11/03/2020   CL 102 11/03/2020   CO2 25 11/03/2020   Lab Results  Component Value Date   ALT 9 11/03/2020   AST 17 11/03/2020   ALKPHOS 121 11/03/2020   BILITOT 0.3 11/03/2020   Lab Results  Component Value Date   HGBA1C 5.8 (H) 11/03/2020   HGBA1C 5.9 (H)  06/23/2020   HGBA1C 5.6 04/01/2020   HGBA1C 5.7 (H) 09/17/2019   HGBA1C 5.9 (H) 06/04/2019   Lab Results  Component Value Date   INSULIN 9.6 11/03/2020   INSULIN 10.5 06/23/2020   INSULIN 12.2 04/01/2020   INSULIN 17.7 09/17/2019   INSULIN 10.7 06/04/2019   Lab Results  Component Value Date   TSH 1.620 06/04/2019   Lab Results  Component Value Date   CHOL 217 (H) 11/03/2020   HDL 73 11/03/2020   LDLCALC 121 (H) 11/03/2020   TRIG 134 11/03/2020   CHOLHDL 3 03/01/2018   Lab Results  Component Value  Date   WBC 6.2 11/03/2020   HGB 13.4 11/03/2020   HCT 41.2 11/03/2020   MCV 81 11/03/2020   PLT 338 11/03/2020   No results found for: IRON, TIBC, FERRITIN  Obesity Behavioral Intervention:   Approximately 15 minutes were spent on the discussion below.  ASK: We discussed the diagnosis of obesity with Darlene [name] today and Darlene [name] agreed to give [place] permission to discuss obesity behavioral modification therapy today.  ASSESS: Darlene [name] has the diagnosis of obesity and her BMI today is 37.14. Darlene [name] is in the action stage of change.   ADVISE: Darlene [name] was educated on the multiple health risks of obesity as well as the benefit of weight loss to improve her health. She was advised of the need for long term treatment and the importance of lifestyle modifications to improve her current health and to decrease her risk of future health problems.  AGREE: Multiple dietary modification options and treatment options were discussed and Darlene [name] agreed to follow the recommendations documented in the above note.  ARRANGE: Darlene [name] was educated on the importance of frequent visits to treat obesity as outlined per CMS and USPSTF guidelines and agreed to schedule her next follow up appointment today.  Attestation Statements:   Reviewed by clinician on day of visit: allergies, medications, problem list, medical history, surgical history, family history, social history, and previous encounter notes.   Darlene [name], am acting as Energy manager for [place], FNP-C.  I have reviewed the above documentation for accuracy and completeness, and I agree with the above. -  Darlene Sans, FNP

## 2021-02-02 DIAGNOSIS — M25562 Pain in left knee: Secondary | ICD-10-CM | POA: Diagnosis not present

## 2021-02-02 DIAGNOSIS — M25511 Pain in right shoulder: Secondary | ICD-10-CM | POA: Diagnosis not present

## 2021-02-02 DIAGNOSIS — M25561 Pain in right knee: Secondary | ICD-10-CM | POA: Diagnosis not present

## 2021-02-02 DIAGNOSIS — M25512 Pain in left shoulder: Secondary | ICD-10-CM | POA: Diagnosis not present

## 2021-02-02 DIAGNOSIS — M545 Low back pain, unspecified: Secondary | ICD-10-CM | POA: Diagnosis not present

## 2021-02-03 DIAGNOSIS — M1711 Unilateral primary osteoarthritis, right knee: Secondary | ICD-10-CM | POA: Diagnosis not present

## 2021-02-03 DIAGNOSIS — M25532 Pain in left wrist: Secondary | ICD-10-CM | POA: Diagnosis not present

## 2021-02-10 DIAGNOSIS — M25532 Pain in left wrist: Secondary | ICD-10-CM | POA: Diagnosis not present

## 2021-02-10 DIAGNOSIS — S63502D Unspecified sprain of left wrist, subsequent encounter: Secondary | ICD-10-CM | POA: Diagnosis not present

## 2021-02-10 DIAGNOSIS — M1812 Unilateral primary osteoarthritis of first carpometacarpal joint, left hand: Secondary | ICD-10-CM | POA: Diagnosis not present

## 2021-02-12 DIAGNOSIS — M545 Low back pain, unspecified: Secondary | ICD-10-CM | POA: Diagnosis not present

## 2021-02-12 DIAGNOSIS — M25512 Pain in left shoulder: Secondary | ICD-10-CM | POA: Diagnosis not present

## 2021-02-12 DIAGNOSIS — M25561 Pain in right knee: Secondary | ICD-10-CM | POA: Diagnosis not present

## 2021-02-12 DIAGNOSIS — M25511 Pain in right shoulder: Secondary | ICD-10-CM | POA: Diagnosis not present

## 2021-02-12 DIAGNOSIS — M25562 Pain in left knee: Secondary | ICD-10-CM | POA: Diagnosis not present

## 2021-02-15 ENCOUNTER — Encounter (INDEPENDENT_AMBULATORY_CARE_PROVIDER_SITE_OTHER): Payer: Self-pay | Admitting: Family Medicine

## 2021-02-15 ENCOUNTER — Other Ambulatory Visit: Payer: Self-pay

## 2021-02-15 ENCOUNTER — Ambulatory Visit (INDEPENDENT_AMBULATORY_CARE_PROVIDER_SITE_OTHER): Payer: Medicare HMO | Admitting: Family Medicine

## 2021-02-15 VITALS — BP 124/80 | HR 76 | Temp 98.0°F | Ht 59.0 in | Wt 185.0 lb

## 2021-02-15 DIAGNOSIS — E7849 Other hyperlipidemia: Secondary | ICD-10-CM

## 2021-02-15 DIAGNOSIS — R7303 Prediabetes: Secondary | ICD-10-CM

## 2021-02-15 DIAGNOSIS — E559 Vitamin D deficiency, unspecified: Secondary | ICD-10-CM

## 2021-02-15 DIAGNOSIS — Z6841 Body Mass Index (BMI) 40.0 and over, adult: Secondary | ICD-10-CM

## 2021-02-15 MED ORDER — VITAMIN D (ERGOCALCIFEROL) 1.25 MG (50000 UNIT) PO CAPS: 50000.0000 [IU] | ORAL_CAPSULE | ORAL | 0 refills | Status: DC

## 2021-02-15 NOTE — Progress Notes (Signed)
The 10-year ASCVD risk score Denman George DC Montez Hageman., et al., 2013) is: 10.8%   Values used to calculate the score:     Age: 70 years     Sex: Female     Is Non-Hispanic African American: Yes     Diabetic: No     Tobacco smoker: No     Systolic Blood Pressure: 124 mmHg     Is BP treated: Yes     HDL Cholesterol: 73 mg/dL     Total Cholesterol: 217 mg/dL

## 2021-02-16 DIAGNOSIS — M1812 Unilateral primary osteoarthritis of first carpometacarpal joint, left hand: Secondary | ICD-10-CM | POA: Diagnosis not present

## 2021-02-16 DIAGNOSIS — M25532 Pain in left wrist: Secondary | ICD-10-CM | POA: Diagnosis not present

## 2021-02-16 DIAGNOSIS — S63502D Unspecified sprain of left wrist, subsequent encounter: Secondary | ICD-10-CM | POA: Diagnosis not present

## 2021-02-16 NOTE — Progress Notes (Signed)
Chief Complaint:   OBESITY Darlene [name] is here to discuss her progress with her obesity treatment plan along with follow-up of her obesity related diagnoses. Darlene [name] is on practicing portion control and making smarter food choices, such as increasing vegetables and decreasing simple carbohydrates and states she is following her eating plan approximately 100% of the time. Darlene [name] states she is walking and strength training 20 minutes 3 times per week.  Today's visit was #: 32 Starting weight: 213 lbs Starting date: 06/04/2019 Today's weight: 185 lbs Today's date: 02/15/2021 Total lbs lost to date: 28 lbs Total lbs lost since last in-office visit: +1  Interim History: Jenise feels she may have added some calories incorrectly in her journaling. She is up 1 lb today and upset about it.  We discussed taking somewhat of a break and doing PC/[place] after the last OV, but she did not and continued to journal.   Subjective:   1. Vitamin D deficiency Ariell's Vit D level is nearly at goal at 43 (goal is 50). She is on weekly prescription Vit D.  2. Pre-diabetes Gao's last A1c was 5.8. She is on Ozempic 0.5 mg weekly. She is unsure if it is helping with her appetite.  Lab Results  Component Value Date   HGBA1C 5.8 (H) 11/03/2020   Lab Results  Component Value Date   INSULIN 9.6 11/03/2020   INSULIN 10.5 06/23/2020   INSULIN 12.2 04/01/2020   INSULIN 17.7 09/17/2019   INSULIN 10.7 06/04/2019   3. Other hyperlipidemia Anhthu feels atorvastatin is make her legs ache. She is taking it every other day (10 mg of atorvastatin). ASCVD risk score is 10.8%, LDL 121. HDL and triglycerides are WNL.  Lab Results  Component Value Date   CHOL 217 (H) 11/03/2020   HDL 73 11/03/2020   LDLCALC 121 (H) 11/03/2020   TRIG 134 11/03/2020   CHOLHDL 3 03/01/2018   Lab Results  Component Value Date   ALT 9 11/03/2020   AST 17 11/03/2020   ALKPHOS 121 11/03/2020   BILITOT 0.3 11/03/2020   The  10-year ASCVD risk score Denman George DC Jr., et al., 2013) is: 10.8%   Values used to calculate the score:     Age: 41 years     Sex: Female     Is Non-Hispanic African American: Yes     Diabetic: No     Tobacco smoker: No     Systolic Blood Pressure: 124 mmHg     Is BP treated: Yes     HDL Cholesterol: 73 mg/dL     Total Cholesterol: 217 mg/dL   Assessment/Plan:   1. Vitamin D deficiency She agrees to continue to take prescription Vitamin D @50 ,000 IU every week and will follow-up for routine testing of Vitamin D, at least 2-3 times per year to avoid over-replacement. - Vitamin D, Ergocalciferol, (DRISDOL) 1.25 MG (50000 UNIT) CAPS capsule; Take 1 capsule (50,000 Units total) by mouth every 7 (seven) days.  Dispense: 4 capsule; Refill: 0  2. Pre-diabetes Nataliyah will continue to work on weight loss, exercise, and decreasing simple carbohydrates to help decrease the risk of diabetes.  -Discontinue Ozempic for now. -Check A1c at next OV.  3. Other hyperlipidemia -Hold atorvastatin until next lab check. We will discuss further. -Check labs at next OV.   4. Obesity: Current BMI 37.35 Ronan is currently in the action stage of change. As such, her goal is to continue with weight loss efforts. She has agreed to keeping  a food journal and adhering to recommended goals of [phone] calories and 80-90 grams protein.   Encouragement provided. Pt has made excellent slow, steady progress with her weight.  Exercise goals: As is  Behavioral modification strategies: planning for success and keeping a strict food journal.  Teagyn has agreed to follow-up with our clinic in 3 weeks- fasting with Katy.   Objective:   Blood pressure 124/80, pulse 76, temperature 98 F (36.7 C), temperature source Oral, height 4\' 11"  (1.499 m), weight 185 lb (83.9 kg), SpO2 99 %. Body mass index is 37.37 kg/m.  General: Cooperative, alert, well developed, in no acute distress. HEENT: Conjunctivae and lids  unremarkable. Cardiovascular: Regular rhythm.  Lungs: Normal work of breathing. Neurologic: No focal deficits.   Lab Results  Component Value Date   CREATININE 0.72 11/03/2020   BUN 9 11/03/2020   NA 142 11/03/2020   K 4.4 11/03/2020   CL 102 11/03/2020   CO2 25 11/03/2020   Lab Results  Component Value Date   ALT 9 11/03/2020   AST 17 11/03/2020   ALKPHOS 121 11/03/2020   BILITOT 0.3 11/03/2020   Lab Results  Component Value Date   HGBA1C 5.8 (H) 11/03/2020   HGBA1C 5.9 (H) 06/23/2020   HGBA1C 5.6 04/01/2020   HGBA1C 5.7 (H) 09/17/2019   HGBA1C 5.9 (H) 06/04/2019   Lab Results  Component Value Date   INSULIN 9.6 11/03/2020   INSULIN 10.5 06/23/2020   INSULIN 12.2 04/01/2020   INSULIN 17.7 09/17/2019   INSULIN 10.7 06/04/2019   Lab Results  Component Value Date   TSH 1.620 06/04/2019   Lab Results  Component Value Date   CHOL 217 (H) 11/03/2020   HDL 73 11/03/2020   LDLCALC 121 (H) 11/03/2020   TRIG 134 11/03/2020   CHOLHDL 3 03/01/2018   Lab Results  Component Value Date   WBC 6.2 11/03/2020   HGB 13.4 11/03/2020   HCT 41.2 11/03/2020   MCV 81 11/03/2020   PLT 338 11/03/2020   No results found for: IRON, TIBC, FERRITIN  Obesity Behavioral Intervention:   Approximately 15 minutes were spent on the discussion below.  ASK: We discussed the diagnosis of obesity with 01/01/2021 today and Brandey agreed to give [name] permission to discuss obesity behavioral modification therapy today.  ASSESS: Ayven has the diagnosis of obesity and her BMI today is 37.4. Brynli is in the action stage of change.   ADVISE: Mehak was educated on the multiple health risks of obesity as well as the benefit of weight loss to improve her health. She was advised of the need for long term treatment and the importance of lifestyle modifications to improve her current health and to decrease her risk of future health problems.  AGREE: Multiple dietary modification options and  treatment options were discussed and Shawnell agreed to follow the recommendations documented in the above note.  ARRANGE: Joycelin was educated on the importance of frequent visits to treat obesity as outlined per CMS and USPSTF guidelines and agreed to schedule her next follow up appointment today.  Attestation Statements:   Reviewed by clinician on day of visit: allergies, medications, problem list, medical history, surgical history, family history, social history, and previous encounter notes.  [name], CMA, am acting as Edmund Hilda for Energy manager, FNP.  I have reviewed the above documentation for accuracy and completeness, and I agree with the above. -  [place], FNP

## 2021-02-17 ENCOUNTER — Encounter (INDEPENDENT_AMBULATORY_CARE_PROVIDER_SITE_OTHER): Payer: Self-pay | Admitting: Family Medicine

## 2021-02-17 DIAGNOSIS — M1812 Unilateral primary osteoarthritis of first carpometacarpal joint, left hand: Secondary | ICD-10-CM | POA: Diagnosis not present

## 2021-02-17 DIAGNOSIS — S63502D Unspecified sprain of left wrist, subsequent encounter: Secondary | ICD-10-CM | POA: Diagnosis not present

## 2021-02-25 DIAGNOSIS — M25561 Pain in right knee: Secondary | ICD-10-CM | POA: Diagnosis not present

## 2021-02-25 DIAGNOSIS — M545 Low back pain, unspecified: Secondary | ICD-10-CM | POA: Diagnosis not present

## 2021-02-25 DIAGNOSIS — M25511 Pain in right shoulder: Secondary | ICD-10-CM | POA: Diagnosis not present

## 2021-02-25 DIAGNOSIS — M25512 Pain in left shoulder: Secondary | ICD-10-CM | POA: Diagnosis not present

## 2021-02-25 DIAGNOSIS — M25562 Pain in left knee: Secondary | ICD-10-CM | POA: Diagnosis not present

## 2021-03-09 ENCOUNTER — Other Ambulatory Visit (INDEPENDENT_AMBULATORY_CARE_PROVIDER_SITE_OTHER): Payer: Self-pay | Admitting: Family Medicine

## 2021-03-09 DIAGNOSIS — R7303 Prediabetes: Secondary | ICD-10-CM

## 2021-03-09 DIAGNOSIS — M545 Low back pain, unspecified: Secondary | ICD-10-CM | POA: Diagnosis not present

## 2021-03-09 DIAGNOSIS — M25511 Pain in right shoulder: Secondary | ICD-10-CM | POA: Diagnosis not present

## 2021-03-09 DIAGNOSIS — M25512 Pain in left shoulder: Secondary | ICD-10-CM | POA: Diagnosis not present

## 2021-03-09 DIAGNOSIS — M25562 Pain in left knee: Secondary | ICD-10-CM | POA: Diagnosis not present

## 2021-03-09 DIAGNOSIS — M25561 Pain in right knee: Secondary | ICD-10-CM | POA: Diagnosis not present

## 2021-03-10 ENCOUNTER — Ambulatory Visit (INDEPENDENT_AMBULATORY_CARE_PROVIDER_SITE_OTHER): Payer: Medicare HMO | Admitting: Adult Health

## 2021-03-10 ENCOUNTER — Encounter (INDEPENDENT_AMBULATORY_CARE_PROVIDER_SITE_OTHER): Payer: Self-pay | Admitting: Adult Health

## 2021-03-10 ENCOUNTER — Other Ambulatory Visit: Payer: Self-pay

## 2021-03-10 VITALS — BP 121/75 | HR 65 | Temp 97.7°F | Ht 59.0 in | Wt 186.0 lb

## 2021-03-10 DIAGNOSIS — E559 Vitamin D deficiency, unspecified: Secondary | ICD-10-CM

## 2021-03-10 DIAGNOSIS — E7849 Other hyperlipidemia: Secondary | ICD-10-CM | POA: Diagnosis not present

## 2021-03-10 DIAGNOSIS — R7303 Prediabetes: Secondary | ICD-10-CM | POA: Diagnosis not present

## 2021-03-10 DIAGNOSIS — Z6841 Body Mass Index (BMI) 40.0 and over, adult: Secondary | ICD-10-CM | POA: Diagnosis not present

## 2021-03-10 MED ORDER — VITAMIN D (ERGOCALCIFEROL) 1.25 MG (50000 UNIT) PO CAPS: 50000.0000 [IU] | ORAL_CAPSULE | ORAL | 0 refills | Status: DC

## 2021-03-10 MED ORDER — ATORVASTATIN CALCIUM 10 MG PO TABS: ORAL_TABLET | ORAL | 0 refills | Status: DC

## 2021-03-10 MED ORDER — OZEMPIC (0.25 OR 0.5 MG/DOSE) 2 MG/1.5ML ~~LOC~~ SOPN: 0.2500 mg | PEN_INJECTOR | SUBCUTANEOUS | 0 refills | Status: DC

## 2021-03-11 LAB — COMPREHENSIVE METABOLIC PANEL
ALT: 13 IU/L (ref 0–32)
AST: 18 IU/L (ref 0–40)
Albumin/Globulin Ratio: 1.7 (ref 1.2–2.2)
Albumin: 4.5 g/dL (ref 3.8–4.8)
Alkaline Phosphatase: 114 IU/L (ref 44–121)
BUN/Creatinine Ratio: 20 (ref 12–28)
BUN: 15 mg/dL (ref 8–27)
Bilirubin Total: 0.4 mg/dL (ref 0.0–1.2)
CO2: 21 mmol/L (ref 20–29)
Calcium: 9.6 mg/dL (ref 8.7–10.3)
Chloride: 101 mmol/L (ref 96–106)
Creatinine, Ser: 0.76 mg/dL (ref 0.57–1.00)
Globulin, Total: 2.6 g/dL (ref 1.5–4.5)
Glucose: 82 mg/dL (ref 65–99)
Potassium: 4.3 mmol/L (ref 3.5–5.2)
Sodium: 140 mmol/L (ref 134–144)
Total Protein: 7.1 g/dL (ref 6.0–8.5)
eGFR: 85 mL/min/{1.73_m2} (ref >59)

## 2021-03-11 LAB — HEMOGLOBIN A1C
Est. average glucose Bld gHb Est-mCnc: 111 mg/dL
Hgb A1c MFr Bld: 5.5 % (ref 4.8–5.6)

## 2021-03-11 LAB — INSULIN, RANDOM: INSULIN: 5.8 u[IU]/mL (ref 2.6–24.9)

## 2021-03-11 LAB — LIPID PANEL
Chol/HDL Ratio: 2.2 ratio (ref 0.0–4.4)
Cholesterol, Total: 190 mg/dL (ref 100–199)
HDL: 85 mg/dL (ref >39)
LDL Chol Calc (NIH): 89 mg/dL (ref 0–99)
Triglycerides: 91 mg/dL (ref 0–149)
VLDL Cholesterol Cal: 16 mg/dL (ref 5–40)

## 2021-03-15 NOTE — Progress Notes (Signed)
Chief Complaint:   OBESITY Darlene [name] is here to discuss her progress with her obesity treatment plan along with follow-up of her obesity related diagnoses. Darlene [name] is on keeping a food journal and adhering to recommended goals of [phone] calories and 80-90 g protein and states she is following her eating plan approximately 70% of the time. Darlene [name] states she is walking the hallway, squats, arm raises, and waist bends 15 minutes 3 times per week.  Today's visit was #: 33 Starting weight: 213 lbs Starting date: 06/04/2019 Today's weight: 186 lbs Today's date: 03/10/2021 Total lbs lost to date: 27 Total lbs lost since last in-office visit: 0  Interim History: Darlene [name] averages 1200-1400 calories per day and 70-80 grams of protein per day. She is frustrated with 1 lb weight gain. Recommended [phone] calories with 80-90 grams of protein per day- discussed the difference between actual intake and recommended intake. She is interested in converting back to category 1 plan.  Subjective:   1. Vitamin D deficiency Darlene [name]'s Vitamin D level was 46.0 on 11/03/2020- just below goal of 50. She is currently taking prescription vitamin D 50,000 IU each week. She denies nausea, vomiting or muscle weakness.  2. Other hyperlipidemia Darlene [name] is on atorvastatin 10 mg every other day, due to myalgias. She has been off atorvastatin 2-3 weeks, then took 2 doses this week. She wants to resume atorvastatin 10 mg every other day.  Lab Results  Component Value Date   ALT 13 03/10/2021   AST 18 03/10/2021   ALKPHOS 114 03/10/2021   BILITOT 0.4 03/10/2021   Lab Results  Component Value Date   CHOL 190 03/10/2021   HDL 85 03/10/2021   LDLCALC 89 03/10/2021   TRIG 91 03/10/2021   CHOLHDL 2.2 03/10/2021   3. Pre-diabetes Darlene [name] has been off Ozempic 0.5 mg for >3 weeks. 11/03/2020 A1c 5.8 with normal BG 93 and elevated insulin 9.6. She wants to restart Ozempic due to weight gain.  She denies family hx of MTC  or personal hx of pancreatitis.  Lab Results  Component Value Date   HGBA1C 5.5 03/10/2021   Lab Results  Component Value Date   INSULIN 5.8 03/10/2021   INSULIN 9.6 11/03/2020   INSULIN 10.5 06/23/2020   INSULIN 12.2 04/01/2020   INSULIN 17.7 09/17/2019    Assessment/Plan:   1. Vitamin D deficiency Low Vitamin D level contributes to fatigue and are associated with obesity, breast, and colon cancer. She agrees to continue to take prescription Vitamin D @50 ,000 IU every week and will follow-up for routine testing of Vitamin D, at least 2-3 times per year to avoid over-replacement.  - Vitamin D, Ergocalciferol, (DRISDOL) 1.25 MG (50000 UNIT) CAPS capsule; Take 1 capsule (50,000 Units total) by mouth every 7 (seven) days.  Dispense: 4 capsule; Refill: 0  2. Other hyperlipidemia Cardiovascular risk and specific lipid/LDL goals reviewed.  We discussed several lifestyle modifications today and Darlene [name] will continue to work on diet, exercise and weight loss efforts. Orders and follow up as documented in patient record.  Check labs today.  Counseling Intensive lifestyle modifications are the first line treatment for this issue. Dietary changes: Increase soluble fiber. Decrease simple carbohydrates. Exercise changes: Moderate to vigorous-intensity aerobic activity 150 minutes per week if tolerated. Lipid-lowering medications: see documented in medical record.  - atorvastatin (LIPITOR) 10 MG tablet; TAKE 1 TABLET(10 MG) BY MOUTH EVERY OTHER DAY  Dispense: 45 tablet; Refill: 0 - Comprehensive metabolic panel - Lipid panel  3. Pre-diabetes  Darlene [name] will continue to work on weight loss, exercise, and decreasing simple carbohydrates to help decrease the risk of diabetes.   - Comprehensive metabolic panel - Hemoglobin A1c - Insulin, random  - Re-start: Semaglutide,0.25 or 0.5MG /DOS, (OZEMPIC, 0.25 OR 0.5 MG/DOSE,) 2 MG/1.5ML SOPN; Inject 0.25 mg into the skin once a week.  Dispense: 1.5  mL; Refill: 0  4. Obesity: Current BMI 37.7  Darlene [name] is currently in the action stage of change. As such, her goal is to continue with weight loss efforts. She has agreed to the Category 1 Plan with additional breakfast options and keeping a food journal and adhering to recommended goals of [phone] calories and 80-90 g protein.   Exercise goals:  As is  Behavioral modification strategies: increasing lean protein intake, decreasing simple carbohydrates, meal planning and cooking strategies, keeping healthy foods in the home, better snacking choices, and planning for success.  Darlene [name] has agreed to follow-up with our clinic in 3 weeks. She was informed of the importance of frequent follow-up visits to maximize her success with intensive lifestyle modifications for her multiple health conditions.   Darlene [name] was informed we would discuss her lab results at her next visit unless there is a critical issue that needs to be addressed sooner. Darlene [name] agreed to keep her next visit at the agreed upon time to discuss these results.  Objective:   Blood pressure 121/75, pulse 65, temperature 97.7 F (36.5 C), height 4\' 11"  (1.499 m), weight 186 lb (84.4 kg), SpO2 96 %. Body mass index is 37.57 kg/m.  General: Cooperative, alert, well developed, in no acute distress. HEENT: Conjunctivae and lids unremarkable. Cardiovascular: Regular rhythm.  Lungs: Normal work of breathing. Neurologic: No focal deficits.   Lab Results  Component Value Date   CREATININE 0.76 03/10/2021   BUN 15 03/10/2021   NA 140 03/10/2021   K 4.3 03/10/2021   CL 101 03/10/2021   CO2 21 03/10/2021   Lab Results  Component Value Date   ALT 13 03/10/2021   AST 18 03/10/2021   ALKPHOS 114 03/10/2021   BILITOT 0.4 03/10/2021   Lab Results  Component Value Date   HGBA1C 5.5 03/10/2021   HGBA1C 5.8 (H) 11/03/2020   HGBA1C 5.9 (H) 06/23/2020   HGBA1C 5.6 04/01/2020   HGBA1C 5.7 (H) 09/17/2019   Lab Results  Component  Value Date   INSULIN 5.8 03/10/2021   INSULIN 9.6 11/03/2020   INSULIN 10.5 06/23/2020   INSULIN 12.2 04/01/2020   INSULIN 17.7 09/17/2019   Lab Results  Component Value Date   TSH 1.620 06/04/2019   Lab Results  Component Value Date   CHOL 190 03/10/2021   HDL 85 03/10/2021   LDLCALC 89 03/10/2021   TRIG 91 03/10/2021   CHOLHDL 2.2 03/10/2021   Lab Results  Component Value Date   WBC 6.2 11/03/2020   HGB 13.4 11/03/2020   HCT 41.2 11/03/2020   MCV 81 11/03/2020   PLT 338 11/03/2020   No results found for: IRON, TIBC, FERRITIN  Obesity Behavioral Intervention:   Approximately 15 minutes were spent on the discussion below.  ASK: We discussed the diagnosis of obesity with 01/01/2021 today and Kamerin agreed to give [name] permission to discuss obesity behavioral modification therapy today.  ASSESS: Jalecia has the diagnosis of obesity and her BMI today is 37.7. Debbrah is in the action stage of change.   ADVISE: Banessa was educated on the multiple health risks of obesity as well as the benefit of weight loss  to improve her health. She was advised of the need for long term treatment and the importance of lifestyle modifications to improve her current health and to decrease her risk of future health problems.  AGREE: Multiple dietary modification options and treatment options were discussed and Niketa agreed to follow the recommendations documented in the above note.  ARRANGE: Elmer was educated on the importance of frequent visits to treat obesity as outlined per CMS and USPSTF guidelines and agreed to schedule her next follow up appointment today.  Attestation Statements:   Reviewed by clinician on day of visit: allergies, medications, problem list, medical history, surgical history, family history, social history, and previous encounter notes.  Edmund Hilda, CMA, am acting as transcriptionist for William Hamburger, NP.  I have reviewed the above documentation for  accuracy and completeness, and I agree with the above. -  Katoria Yetman d. Kindell Strada, NP-C

## 2021-03-18 DIAGNOSIS — M25511 Pain in right shoulder: Secondary | ICD-10-CM | POA: Diagnosis not present

## 2021-03-18 DIAGNOSIS — M25512 Pain in left shoulder: Secondary | ICD-10-CM | POA: Diagnosis not present

## 2021-03-18 DIAGNOSIS — M545 Low back pain, unspecified: Secondary | ICD-10-CM | POA: Diagnosis not present

## 2021-03-18 DIAGNOSIS — M25562 Pain in left knee: Secondary | ICD-10-CM | POA: Diagnosis not present

## 2021-03-18 DIAGNOSIS — M25561 Pain in right knee: Secondary | ICD-10-CM | POA: Diagnosis not present

## 2021-03-31 ENCOUNTER — Encounter (INDEPENDENT_AMBULATORY_CARE_PROVIDER_SITE_OTHER): Payer: Self-pay | Admitting: Family Medicine

## 2021-03-31 ENCOUNTER — Other Ambulatory Visit: Payer: Self-pay

## 2021-03-31 ENCOUNTER — Ambulatory Visit (INDEPENDENT_AMBULATORY_CARE_PROVIDER_SITE_OTHER): Payer: Medicare HMO | Admitting: Family Medicine

## 2021-03-31 VITALS — BP 108/73 | HR 89 | Temp 98.4°F | Ht 59.0 in | Wt 185.0 lb

## 2021-03-31 DIAGNOSIS — E7849 Other hyperlipidemia: Secondary | ICD-10-CM

## 2021-03-31 DIAGNOSIS — E559 Vitamin D deficiency, unspecified: Secondary | ICD-10-CM

## 2021-03-31 DIAGNOSIS — R7303 Prediabetes: Secondary | ICD-10-CM | POA: Diagnosis not present

## 2021-03-31 DIAGNOSIS — Z6841 Body Mass Index (BMI) 40.0 and over, adult: Secondary | ICD-10-CM

## 2021-03-31 MED ORDER — VITAMIN D (ERGOCALCIFEROL) 1.25 MG (50000 UNIT) PO CAPS: 50000.0000 [IU] | ORAL_CAPSULE | ORAL | 0 refills | Status: DC

## 2021-03-31 MED ORDER — PRAVASTATIN SODIUM 10 MG PO TABS: 10.0000 mg | ORAL_TABLET | Freq: Every day | ORAL | 0 refills | Status: DC

## 2021-04-06 ENCOUNTER — Encounter (INDEPENDENT_AMBULATORY_CARE_PROVIDER_SITE_OTHER): Payer: Self-pay | Admitting: Family Medicine

## 2021-04-06 DIAGNOSIS — I10 Essential (primary) hypertension: Secondary | ICD-10-CM | POA: Diagnosis not present

## 2021-04-06 DIAGNOSIS — E669 Obesity, unspecified: Secondary | ICD-10-CM | POA: Diagnosis not present

## 2021-04-06 DIAGNOSIS — R7303 Prediabetes: Secondary | ICD-10-CM | POA: Diagnosis not present

## 2021-04-06 DIAGNOSIS — Z01818 Encounter for other preprocedural examination: Secondary | ICD-10-CM | POA: Diagnosis not present

## 2021-04-06 DIAGNOSIS — S069X1D Unspecified intracranial injury with loss of consciousness of 30 minutes or less, subsequent encounter: Secondary | ICD-10-CM | POA: Diagnosis not present

## 2021-04-06 DIAGNOSIS — M25512 Pain in left shoulder: Secondary | ICD-10-CM | POA: Diagnosis not present

## 2021-04-06 DIAGNOSIS — E559 Vitamin D deficiency, unspecified: Secondary | ICD-10-CM | POA: Diagnosis not present

## 2021-04-06 NOTE — Progress Notes (Signed)
Chief Complaint:   OBESITY Darlene [name] is here to discuss her progress with her obesity treatment plan along with follow-up of her obesity related diagnoses. Darlene [name] is on the Category 1 Plan and keeping a food journal and adhering to recommended goals of [phone] calories and 80-90 grams of protein and states she is following her eating plan approximately 80% of the time. Darlene [name] states she is stretching, doing squats, walking, and going to the gym for 20 minutes 2 times per week.  Today's visit was #: 34 Starting weight: 213 lbs Starting date: 06/04/2019 Today's weight: 185 lbs Today's date: 03/31/2021 Total lbs lost to date: 28 lbs Total lbs lost since last in-office visit: 1 lb  Interim History: Darlene [name] is frustrated with slow weight loss.  However, she has done well overall.  She is mostly journaling vs following cat 1 because she likes to choose her own foods.  She likes variety in her foods.  She has questions about being vegan.  Subjective:   1. Vitamin D deficiency Vitamin D below goal of 50 at 75.  On weekly prescription vitamin D.  Lab Results  Component Value Date   VD25OH 46.0 11/03/2020   VD25OH 44.5 06/23/2020   VD25OH 45.8 04/01/2020   2. Other hyperlipidemia LDL at goal with atorvastatin.  Notes left shoulder pain and feels it is from atorvastatin, which she only takes every other day.  Lab Results  Component Value Date   ALT 13 03/10/2021   AST 18 03/10/2021   ALKPHOS 114 03/10/2021   BILITOT 0.4 03/10/2021   Lab Results  Component Value Date   CHOL 190 03/10/2021   HDL 85 03/10/2021   LDLCALC 89 03/10/2021   TRIG 91 03/10/2021   CHOLHDL 2.2 03/10/2021   3. Prediabetes She started Ozempic last office visit and is tolerating well.  She notes some hunger.  Last A1c was 5.5.  Lab Results  Component Value Date   HGBA1C 5.5 03/10/2021   Lab Results  Component Value Date   INSULIN 5.8 03/10/2021   INSULIN 9.6 11/03/2020   INSULIN 10.5 06/23/2020    INSULIN 12.2 04/01/2020   INSULIN 17.7 09/17/2019   Assessment/Plan:   1. Vitamin D deficiency Refill vitamin D 50,000 IU weekly with 4 refill.  Check vitamin D level at next office visit.  - Refill Vitamin D, Ergocalciferol, (DRISDOL) 1.25 MG (50000 UNIT) CAPS capsule; Take 1 capsule (50,000 Units total) by mouth every 7 (seven) days.  Dispense: 4 capsule; Refill: 0  2. Other hyperlipidemia New presciption for pravastatin 10 mg daily #90 with no refills. Well try this to see if myalgias cease. She will take QOD.  - Start pravastatin (PRAVACHOL) 10 MG tablet; Take 1 tablet (10 mg total) by mouth daily.  Dispense: 90 tablet; Refill: 0  3. Prediabetes Increase Ozempic dose to 0.5 mg subcutaneously weekly.  4. Obesity: Current BMI 37.35  Darlene [name] is currently in the action stage of change. As such, her goal is to continue with weight loss efforts. She has agreed to keeping a food journal and adhering to recommended goals of [phone] calories and 80 grams of protein.   Exercise goals:  Increase cardio to 20 minutes 3 times per week.  Behavioral modification strategies: increasing lean protein intake and keeping a strict food journal.  Darlene [name] has agreed to follow-up with our clinic in 3 weeks.  Objective:   Blood pressure 108/73, pulse 89, temperature 98.4 F (36.9 C), height 4\' 11"  (1.499 m), weight 185  lb (83.9 kg), SpO2 96 %. Body mass index is 37.37 kg/m.  General: Cooperative, alert, well developed, in no acute distress. HEENT: Conjunctivae and lids unremarkable. Cardiovascular: Regular rhythm.  Lungs: Normal work of breathing. Neurologic: No focal deficits.   Lab Results  Component Value Date   CREATININE 0.76 03/10/2021   BUN 15 03/10/2021   NA 140 03/10/2021   K 4.3 03/10/2021   CL 101 03/10/2021   CO2 21 03/10/2021   Lab Results  Component Value Date   ALT 13 03/10/2021   AST 18 03/10/2021   ALKPHOS 114 03/10/2021   BILITOT 0.4 03/10/2021   Lab Results   Component Value Date   HGBA1C 5.5 03/10/2021   HGBA1C 5.8 (H) 11/03/2020   HGBA1C 5.9 (H) 06/23/2020   HGBA1C 5.6 04/01/2020   HGBA1C 5.7 (H) 09/17/2019   Lab Results  Component Value Date   INSULIN 5.8 03/10/2021   INSULIN 9.6 11/03/2020   INSULIN 10.5 06/23/2020   INSULIN 12.2 04/01/2020   INSULIN 17.7 09/17/2019   Lab Results  Component Value Date   TSH 1.620 06/04/2019   Lab Results  Component Value Date   CHOL 190 03/10/2021   HDL 85 03/10/2021   LDLCALC 89 03/10/2021   TRIG 91 03/10/2021   CHOLHDL 2.2 03/10/2021   Lab Results  Component Value Date   VD25OH 46.0 11/03/2020   VD25OH 44.5 06/23/2020   VD25OH 45.8 04/01/2020   Lab Results  Component Value Date   WBC 6.2 11/03/2020   HGB 13.4 11/03/2020   HCT 41.2 11/03/2020   MCV 81 11/03/2020   PLT 338 11/03/2020   Obesity Behavioral Intervention:   Approximately 15 minutes were spent on the discussion below.  ASK: We discussed the diagnosis of obesity with Darlene [name] today and Darlene [name] agreed to give [place] permission to discuss obesity behavioral modification therapy today.  ASSESS: Darlene [name] has the diagnosis of obesity and her BMI today is 37.4. Darlene [name] is in the action stage of change.   ADVISE: Darlene [name] was educated on the multiple health risks of obesity as well as the benefit of weight loss to improve her health. She was advised of the need for long term treatment and the importance of lifestyle modifications to improve her current health and to decrease her risk of future health problems.  AGREE: Multiple dietary modification options and treatment options were discussed and Darlene [name] agreed to follow the recommendations documented in the above note.  ARRANGE: Darlene [name] was educated on the importance of frequent visits to treat obesity as outlined per CMS and USPSTF guidelines and agreed to schedule her next follow up appointment today.  Attestation Statements:   Reviewed by clinician on day of visit:  allergies, medications, problem list, medical history, surgical history, family history, social history, and previous encounter notes.  I, Insurance claims handler, CMA, am acting as Energy manager for [place], FNP.  I have reviewed the above documentation for accuracy and completeness, and I agree with the above. -  Jesse Sans, FNP

## 2021-04-13 ENCOUNTER — Other Ambulatory Visit: Payer: Self-pay

## 2021-04-13 ENCOUNTER — Encounter: Payer: Self-pay | Admitting: Podiatry

## 2021-04-13 ENCOUNTER — Ambulatory Visit (INDEPENDENT_AMBULATORY_CARE_PROVIDER_SITE_OTHER): Payer: Medicare HMO | Admitting: Podiatry

## 2021-04-13 DIAGNOSIS — B351 Tinea unguium: Secondary | ICD-10-CM

## 2021-04-13 DIAGNOSIS — L601 Onycholysis: Secondary | ICD-10-CM | POA: Diagnosis not present

## 2021-04-13 MED ORDER — EFINACONAZOLE 10 % EX SOLN: 1.0000 [drp] | Freq: Every day | CUTANEOUS | 11 refills | Status: AC

## 2021-04-13 NOTE — Patient Instructions (Signed)
Efinaconazole Topical Solution What is this medication? EFINACONAZOLE (e FEE na KON a zole) is an antifungal medicine. It is used to treat certain kinds of fungal infections of the toenail. This medicine may be used for other purposes; ask your health care provider or pharmacist if you have questions. COMMON BRAND NAME(S): JUBLIA What should I tell my care team before I take this medication? They need to know if you have any of these conditions: an unusual or allergic reaction to efinaconazole, other medicines, foods, dyes or preservatives pregnant or trying to get pregnant breast-feeding How should I use this medication? This medicine is for external use only. Do not take by mouth. Follow the directions on the label. Wash hands before and after use. Apply this medicine using the provided brush to cover the entire toenail. Do not use your medicine more often than directed. Finish the full course prescribed by your doctor or health care professional even if you think your condition is better. Do not stop using except on the advice of your doctor or health care professional. Talk to your pediatrician regarding the use of this medicine in children. While this drug may be prescribed for children as young as 6 years for selected conditions, precautions do apply. Overdosage: If you think you have taken too much of this medicine contact a poison control center or emergency room at once. NOTE: This medicine is only for you. Do not share this medicine with others. What if I miss a dose? If you miss a dose, use it as soon as you can. If it is almost time for your next dose, use only that dose. Do not use double or extra doses. What may interact with this medication? Interactions have not been studied. Do not use any other nail products (i.e., nail polish, pedicures) during treatment with this medicine. This list may not describe all possible interactions. Give your health care provider a list of all the  medicines, herbs, non-prescription drugs, or dietary supplements you use. Also tell them if you smoke, drink alcohol, or use illegal drugs. Some items may interact with your medicine. What should I watch for while using this medication? Do not get this medicine in your eyes. If you do, rinse out with plenty of cool tap water. Tell your doctor or health care professional if your symptoms do not start to get better or if they get worse. Wait for at least 10 minutes after bathing before applying this medication. After bathing, make sure that your feet are very dry. Fungal infections like moist conditions. Do not walk around barefoot. To help prevent reinfection, wear freshly washed cotton, not synthetic clothing. Tell your doctor or health care professional if you develop sores or blisters that do not heal properly. If your nail infection returns after you stop using this medicine, contact your doctor or health care professional. What side effects may I notice from receiving this medication? Side effects that you should report to your doctor or health care professional as soon as possible: allergic reactions like skin rash, itching or hives, swelling of the face, lips, or tongue ingrown toenail Side effects that usually do not require medical attention (report to your doctor or health care professional if they continue or are bothersome): mild skin irritation, burning, or itching This list may not describe all possible side effects. Call your doctor for medical advice about side effects. You may report side effects to FDA at 1-800-FDA-1088. Where should I keep my medication? Keep out of the   reach of children. Store at room temperature between 20 and 25 degrees C (68 and 77 degrees F). Keep this medicine in the original container. Throw away any unused medicine after the expiration date. This medicine is flammable. Avoid exposure to heat, fire, flame, and smoking. NOTE: This sheet is a summary. It may  not cover all possible information. If you have questions about this medicine, talk to your doctor, pharmacist, or health care provider.  2022 Elsevier/Gold Standard (2019-01-21 16:14:11)  

## 2021-04-15 ENCOUNTER — Telehealth: Payer: Self-pay | Admitting: *Deleted

## 2021-04-15 NOTE — Telephone Encounter (Signed)
Patient is calling,requesting something else to replace the medication prescribed(Efinaconazole) this week, insurance will not cover. Please advise

## 2021-04-16 ENCOUNTER — Other Ambulatory Visit: Payer: Self-pay | Admitting: Podiatry

## 2021-04-16 DIAGNOSIS — E559 Vitamin D deficiency, unspecified: Secondary | ICD-10-CM | POA: Diagnosis not present

## 2021-04-16 DIAGNOSIS — F339 Major depressive disorder, recurrent, unspecified: Secondary | ICD-10-CM | POA: Diagnosis not present

## 2021-04-16 DIAGNOSIS — I10 Essential (primary) hypertension: Secondary | ICD-10-CM | POA: Diagnosis not present

## 2021-04-16 DIAGNOSIS — E669 Obesity, unspecified: Secondary | ICD-10-CM | POA: Diagnosis not present

## 2021-04-16 DIAGNOSIS — E785 Hyperlipidemia, unspecified: Secondary | ICD-10-CM | POA: Diagnosis not present

## 2021-04-16 DIAGNOSIS — M170 Bilateral primary osteoarthritis of knee: Secondary | ICD-10-CM | POA: Diagnosis not present

## 2021-04-16 DIAGNOSIS — D696 Thrombocytopenia, unspecified: Secondary | ICD-10-CM | POA: Diagnosis not present

## 2021-04-16 MED ORDER — TAVABOROLE 5 % EX SOLN: 1.0000 [drp] | CUTANEOUS | 2 refills | Status: DC

## 2021-04-17 NOTE — Progress Notes (Signed)
Subjective: 70 year old female presents the office today for evaluation of nail fungus.  She has been using Penlac without significant improvement.  She still thick nails discolored.  She is becoming off some.  No drainage or pus or any swelling. Denies any systemic complaints such as fevers, chills, nausea, vomiting. No acute changes since last appointment, and no other complaints at this time.   Objective: AAO x3, NAD DP/PT pulses palpable bilaterally, CRT less than 3 seconds Left hallux nails hypertrophic and dystrophic with yellow-brown discoloration.  No hyperpigmented changes.  No edema, erythema or signs of infection.  No pain with calf compression, swelling, warmth, erythema  Assessment: Onychomycosis  Plan: -All treatment options discussed with the patient including all alternatives, risks, complications.  -We will switch from Penlac.  Discussed oral medication reflux follow-up on this.  Prescribed Jublia. -Patient encouraged to call the office with any questions, concerns, change in symptoms.   Vivi Barrack DPM

## 2021-04-19 DIAGNOSIS — M25512 Pain in left shoulder: Secondary | ICD-10-CM | POA: Diagnosis not present

## 2021-04-19 DIAGNOSIS — M67814 Other specified disorders of tendon, left shoulder: Secondary | ICD-10-CM | POA: Diagnosis not present

## 2021-04-20 NOTE — Telephone Encounter (Signed)
Patient is requesting prior Berkley Harvey so her insurance will cover the Efinaconazole. Please advise.

## 2021-04-21 ENCOUNTER — Telehealth: Payer: Self-pay | Admitting: *Deleted

## 2021-04-21 ENCOUNTER — Other Ambulatory Visit: Payer: Self-pay

## 2021-04-21 ENCOUNTER — Encounter (INDEPENDENT_AMBULATORY_CARE_PROVIDER_SITE_OTHER): Payer: Self-pay | Admitting: Family Medicine

## 2021-04-21 ENCOUNTER — Ambulatory Visit (INDEPENDENT_AMBULATORY_CARE_PROVIDER_SITE_OTHER): Payer: Medicare HMO | Admitting: Family Medicine

## 2021-04-21 VITALS — BP 118/75 | HR 69 | Temp 98.5°F | Ht 59.0 in | Wt 184.0 lb

## 2021-04-21 DIAGNOSIS — R7303 Prediabetes: Secondary | ICD-10-CM | POA: Diagnosis not present

## 2021-04-21 DIAGNOSIS — Z6841 Body Mass Index (BMI) 40.0 and over, adult: Secondary | ICD-10-CM

## 2021-04-21 DIAGNOSIS — E559 Vitamin D deficiency, unspecified: Secondary | ICD-10-CM | POA: Diagnosis not present

## 2021-04-21 MED ORDER — VITAMIN D (ERGOCALCIFEROL) 1.25 MG (50000 UNIT) PO CAPS: 50000.0000 [IU] | ORAL_CAPSULE | ORAL | 0 refills | Status: DC

## 2021-04-21 MED ORDER — OZEMPIC (0.25 OR 0.5 MG/DOSE) 2 MG/1.5ML ~~LOC~~ SOPN: 0.2500 mg | PEN_INJECTOR | SUBCUTANEOUS | 0 refills | Status: DC

## 2021-04-21 NOTE — Telephone Encounter (Signed)
I did a prior authorization on the website Cover My Meds today. Misty Stanley

## 2021-04-22 LAB — VITAMIN D 25 HYDROXY (VIT D DEFICIENCY, FRACTURES): Vit D, 25-Hydroxy: 46.2 ng/mL (ref 30.0–100.0)

## 2021-04-23 ENCOUNTER — Telehealth: Payer: Self-pay | Admitting: Podiatry

## 2021-04-23 NOTE — Telephone Encounter (Signed)
Patient calling to in inform Dr. Ardelle Anton that she went to the pharmacy this morning and the pharmacy stated she did not have anything on file. Please advise

## 2021-04-26 ENCOUNTER — Encounter (INDEPENDENT_AMBULATORY_CARE_PROVIDER_SITE_OTHER): Payer: Self-pay | Admitting: Family Medicine

## 2021-04-26 DIAGNOSIS — M7582 Other shoulder lesions, left shoulder: Secondary | ICD-10-CM | POA: Insufficient documentation

## 2021-04-26 NOTE — Progress Notes (Signed)
Chief Complaint:   OBESITY Darlene [name] is here to discuss her progress with her obesity treatment plan along with follow-up of her obesity related diagnoses. Darlene [name] is on keeping a food journal and adhering to recommended goals of [phone] calories and 80 grams protein and states she is following her eating plan approximately 80% of the time. Darlene [name] states she is going to the gym 30 minutes 3 times per week.  Today's visit was #: 35 Starting weight: 213 lbs Starting date: 06/04/2019 Today's weight: 184 lbs Today's date: 04/21/2021 Total lbs lost to date: 29 Total lbs lost since last in-office visit: 1  Interim History: Darlene [name] has really been concentrating on getting more protein in. She is exceeding her protein goal. She sometimes exceeds calorie goal a little bit. She is exercising at [facility] 3 days per week.  Subjective:   1. Pre-diabetes Darlene [name]'s last A1c was 5.5. She is on Ozempic 0.5 mg.  Lab Results  Component Value Date   HGBA1C 5.5 03/10/2021   Lab Results  Component Value Date   INSULIN 5.8 03/10/2021   INSULIN 9.6 11/03/2020   INSULIN 10.5 06/23/2020   INSULIN 12.2 04/01/2020   INSULIN 17.7 09/17/2019   2. Vitamin D deficiency Darlene [name]'s Vit D is almost at goal. She is currently taking prescription vitamin D 50,000 IU each week.   Lab Results  Component Value Date   VD25OH 46.2 04/21/2021   VD25OH 46.0 11/03/2020   VD25OH 44.5 06/23/2020   Assessment/Plan:   1. Pre-diabetes  Refill- Semaglutide,0.25 or 0.5MG /DOS, (OZEMPIC, 0.25 OR 0.5 MG/DOSE,) 2 MG/1.5ML SOPN; Inject 0.25 mg into the skin once a week.  Dispense: 1.5 mL; Refill: 0  2. Vitamin D deficiency Low Vitamin D level contributes to fatigue and are associated with obesity, breast, and She agrees to continue to take prescription Vitamin D @50 ,000 IU every week and will follow-up for routine testing of Vitamin D, at least 2-3 times per year to avoid over-replacement.  Refill- Vitamin D,  Ergocalciferol, (DRISDOL) 1.25 MG (50000 UNIT) CAPS capsule; Take 1 capsule (50,000 Units total) by mouth every 7 (seven) days.  Dispense: 4 capsule; Refill: 0  Check labs today. - VITAMIN D 25 Hydroxy (Vit-D Deficiency, Fractures)  3. Obesity: Current BMI 37.14  Darlene [name] is currently in the action stage of change. As such, her goal is to continue with weight loss efforts. She has agreed to keeping a food journal and adhering to recommended goals of [phone] calories and 80 grams protein.   Exercise goals:  As is  Behavioral modification strategies: planning for success.  Darlene [name] has agreed to follow-up with our clinic in 3 weeks.  Objective:   Blood pressure 118/75, pulse 69, temperature 98.5 F (36.9 C), height 4\' 11"  (1.499 m), weight 184 lb (83.5 kg), SpO2 98 %. Body mass index is 37.16 kg/m.  General: Cooperative, alert, well developed, in no acute distress. HEENT: Conjunctivae and lids unremarkable. Cardiovascular: Regular rhythm.  Lungs: Normal work of breathing. Neurologic: No focal deficits.   Lab Results  Component Value Date   CREATININE 0.76 03/10/2021   BUN 15 03/10/2021   NA 140 03/10/2021   K 4.3 03/10/2021   CL 101 03/10/2021   CO2 21 03/10/2021   Lab Results  Component Value Date   ALT 13 03/10/2021   AST 18 03/10/2021   ALKPHOS 114 03/10/2021   BILITOT 0.4 03/10/2021   Lab Results  Component Value Date   HGBA1C 5.5 03/10/2021   HGBA1C 5.8 (H)  11/03/2020   HGBA1C 5.9 (H) 06/23/2020   HGBA1C 5.6 04/01/2020   HGBA1C 5.7 (H) 09/17/2019   Lab Results  Component Value Date   INSULIN 5.8 03/10/2021   INSULIN 9.6 11/03/2020   INSULIN 10.5 06/23/2020   INSULIN 12.2 04/01/2020   INSULIN 17.7 09/17/2019   Lab Results  Component Value Date   TSH 1.620 06/04/2019   Lab Results  Component Value Date   CHOL 190 03/10/2021   HDL 85 03/10/2021   LDLCALC 89 03/10/2021   TRIG 91 03/10/2021   CHOLHDL 2.2 03/10/2021   Lab Results  Component Value  Date   VD25OH 46.2 04/21/2021   VD25OH 46.0 11/03/2020   VD25OH 44.5 06/23/2020   Lab Results  Component Value Date   WBC 6.2 11/03/2020   HGB 13.4 11/03/2020   HCT 41.2 11/03/2020   MCV 81 11/03/2020   PLT 338 11/03/2020   No results found for: IRON, TIBC, FERRITIN  Obesity Behavioral Intervention:   Approximately 15 minutes were spent on the discussion below.  ASK: We discussed the diagnosis of obesity with Darlene [name] today and Darlene [name] agreed to give [place] permission to discuss obesity behavioral modification therapy today.  ASSESS: Darlene [name] has the diagnosis of obesity and her BMI today is 37.2. Darlene [name] is in the action stage of change.   ADVISE: Darlene [name] was educated on the multiple health risks of obesity as well as the benefit of weight loss to improve her health. She was advised of the need for long term treatment and the importance of lifestyle modifications to improve her current health and to decrease her risk of future health problems.  AGREE: Multiple dietary modification options and treatment options were discussed and Darlene [name] agreed to follow the recommendations documented in the above note.  ARRANGE: Darlene [name] was educated on the importance of frequent visits to treat obesity as outlined per CMS and USPSTF guidelines and agreed to schedule her next follow up appointment today.  Attestation Statements:   Reviewed by clinician on day of visit: allergies, medications, problem list, medical history, surgical history, family history, social history, and previous encounter notes.  Edmund Hilda, CMA, am acting as Energy manager for [place], FNP.  I have reviewed the above documentation for accuracy and completeness, and I agree with the above. -  Jesse Sans, FNP

## 2021-04-27 ENCOUNTER — Telehealth: Payer: Self-pay | Admitting: *Deleted

## 2021-04-27 NOTE — Telephone Encounter (Signed)
Called Patient today to inform her that the Tavaborole 5% topical solution was bot covered by insurance plan. I did inform the patient that preferred alternative is terbinafine HCl, itraconazole, griseofulvin ultramicrosize oral meds. The patient prefers not to take the oral medication due to surgery that will take place in October. I told the patient that we could have formulation 7 at front desk for her.

## 2021-04-27 NOTE — Telephone Encounter (Signed)
Called the patient's pharmacy and they said that the insurance would rather cover similar medications to [facility] which JJH:ERDEYCXKGYJE,HUDJSHFWYOV, griseofulvin(all tablets) but will resend the Prior Authorization form for the [place] as well.Please advise.

## 2021-04-29 IMAGING — MR MR ANKLE*L* W/O CM
4 of 5 series · 13 of 40 positions shown · non-contrast
Comparison: X-ray 08/24/2020

CLINICAL DATA: Lateral left ankle pain after fall on 07/14/2020

EXAM:
MRI OF THE LEFT ANKLE WITHOUT CONTRAST
TECHNIQUE: Multiplanar, multisequence MR imaging of the ankle was performed. No
intravenous contrast was administered.

[Series 3: PD fat-sat · axial · left · 3.0mm · 0.25mm/px · z∈[-105,-5]mm · 4 of 30 slices shown]
[im 1/30]
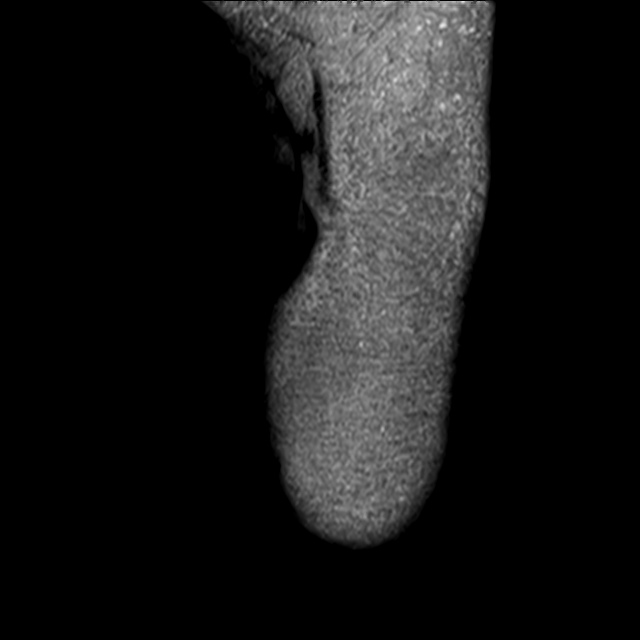
[im 4/30]
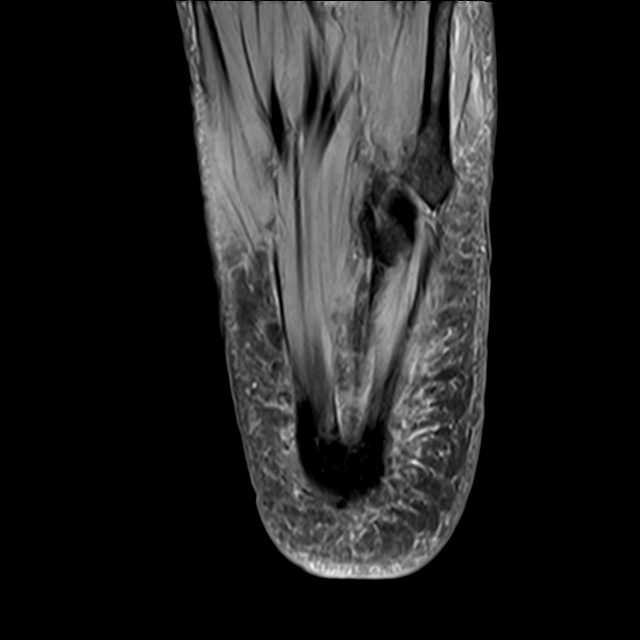
[im 15/30]
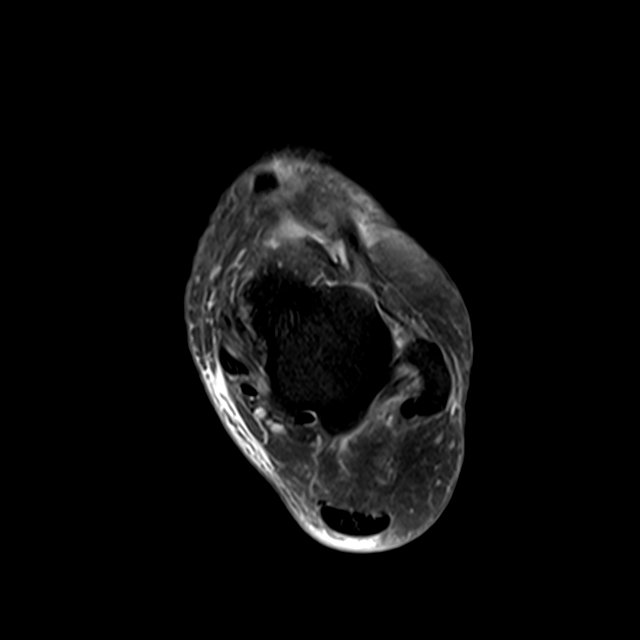
[im 26/30]
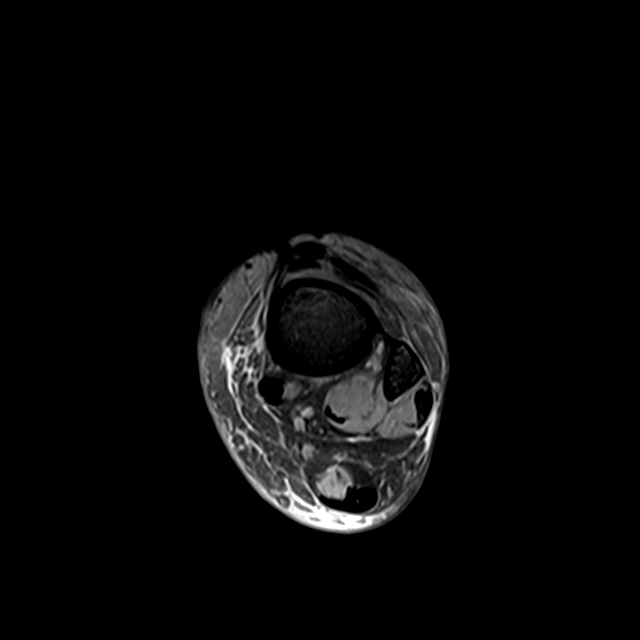

[Series 4: T2 fat-sat · axial · left · 3.0mm · 0.25mm/px · z∈[-93,-5]mm · 3 of 30 slices shown (1 of 2)]
[im 4/30]
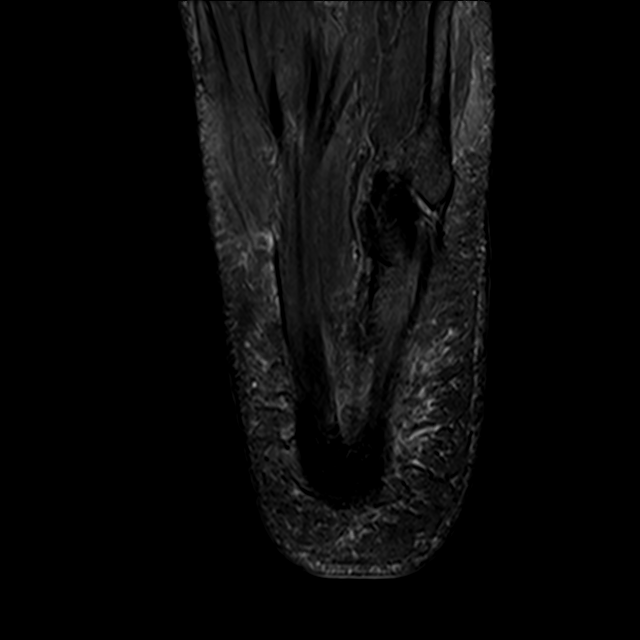
[im 15/30]
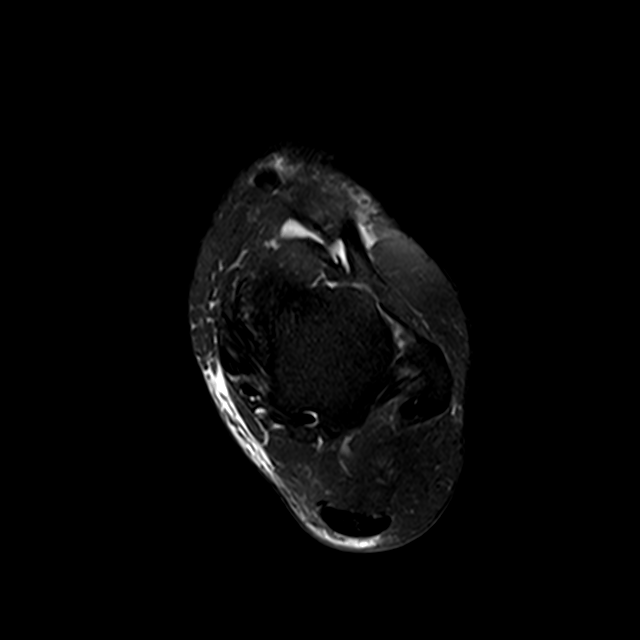
[im 26/30]
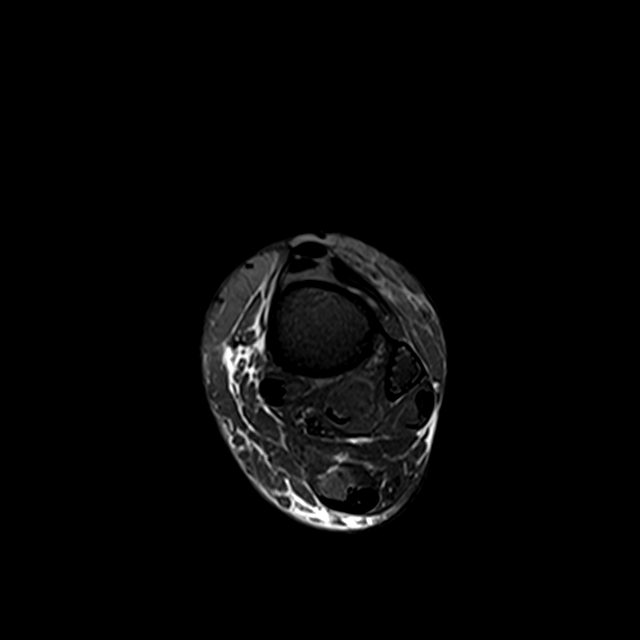

[Series 5: T1 · sagittal · left · 4.0mm · 0.27mm/px · 3 of 19 slices shown]
[im 4/19]
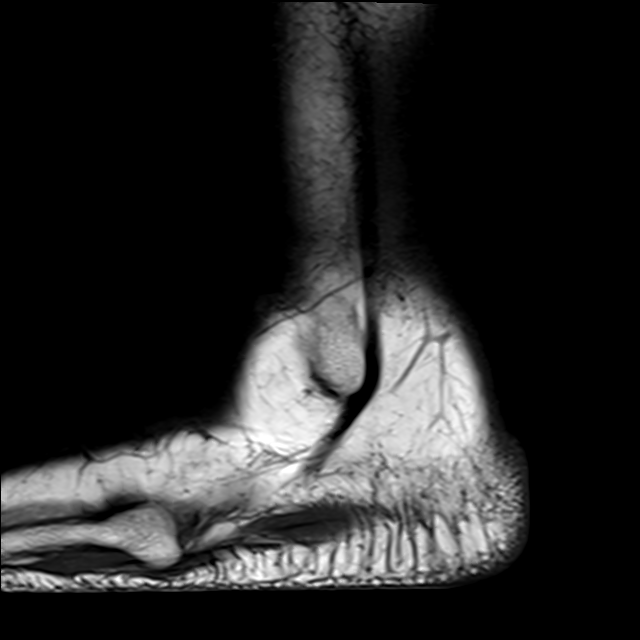
[im 11/19]
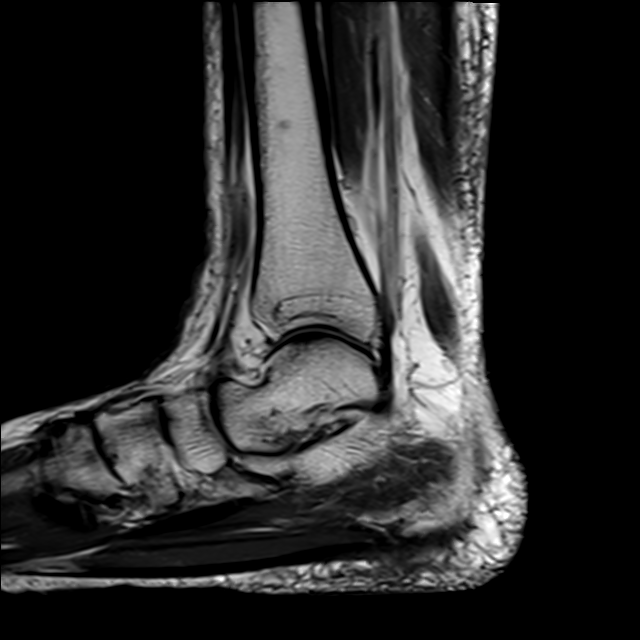
[im 19/19]
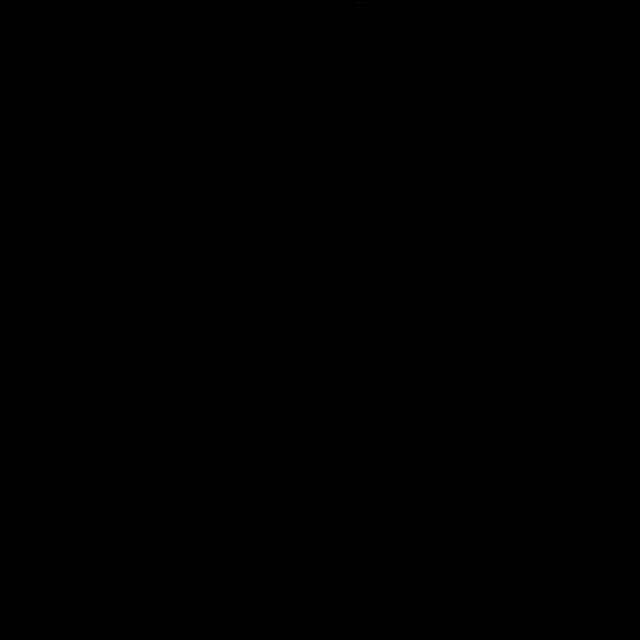

[Series 7: T2 fat-sat · coronal · left · 3.0mm · 0.25mm/px · 3 of 33 slices shown (2 of 2)]
[im 4/33]
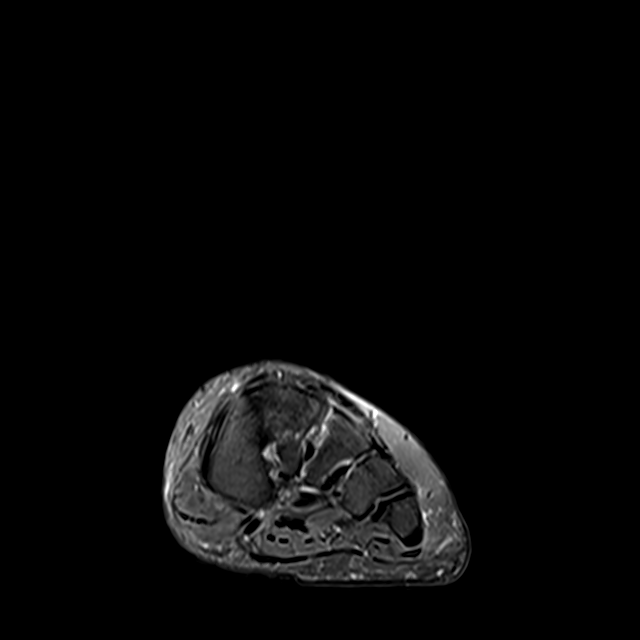
[im 18/33]
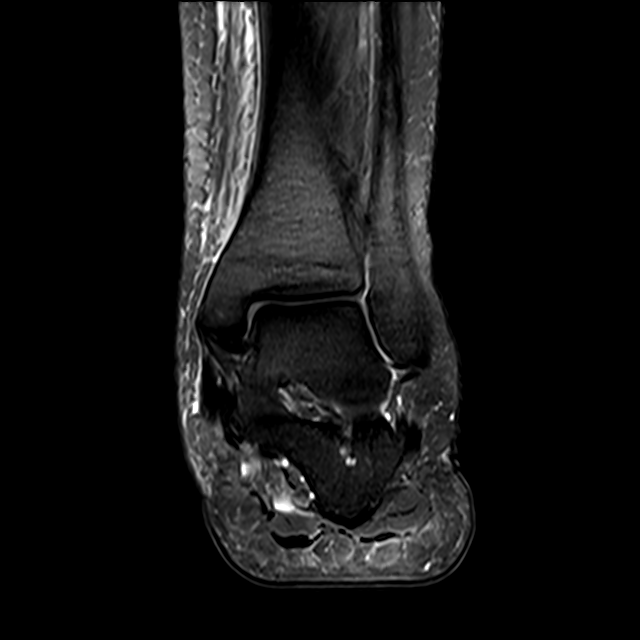
[im 29/33]
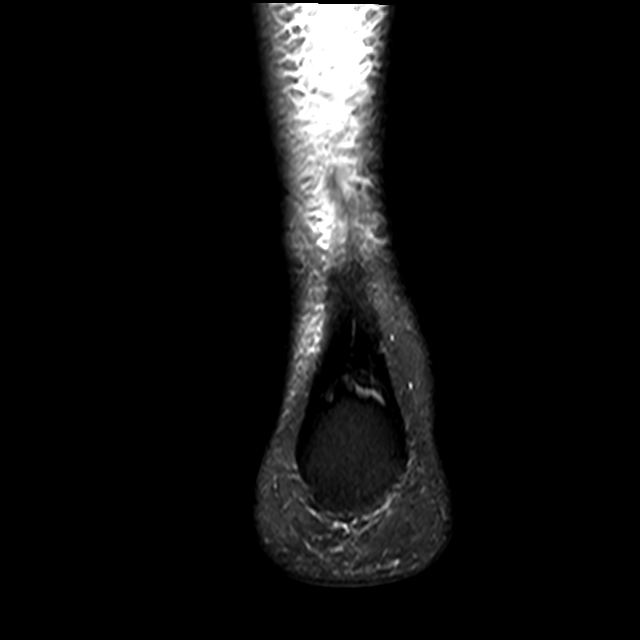

[13 of 40 positions shown; findings below may reference images not displayed]

FINDINGS: TENDONS

Peroneal: Intact peroneus longus and peroneus brevis tendons. No
tenosynovitis.

Posteromedial: Partial-thickness insertional tear of the tibialis
posterior tendon onto the medial navicular (series 6, image 5).
Small os navicular without marrow edema. Intact flexor hallucis
longus and flexor digitorum longus tendons.

Anterior: Intact tibialis anterior, extensor hallucis longus and
extensor digitorum longus tendons.

Achilles: Intact.

Plantar Fascia: Intact.

LIGAMENTS

Lateral: The anterior and posterior tibiofibular ligaments are
intact. The anterior and posterior talofibular ligaments are intact.
Intact calcaneofibular ligament.

Medial: Deltoid ligament and spring ligament complex intact.

CARTILAGE

Ankle Joint: No joint effusion or chondral defect.

Subtalar Joints/Sinus Tarsi: No joint effusion or chondral defect.
Preservation of the anatomic fat within the sinus tarsi.

Bones: Pes planovalgus alignment. No acute fracture or dislocation.
Mild degenerative changes throughout the midfoot and across the
tarsometatarsal joints, most pronounced at the second and third TMT
joint. No suspicious bone lesion.

Other: Mild subcutaneous edema overlies the medial malleolus.
IMPRESSION: 1. Partial-thickness insertional tear of the tibialis posterior
tendon.
2. Pes planovalgus alignment.
3. Mild degenerative changes throughout the midfoot and across the
tarsometatarsal joints, most pronounced at the second and third TMT
joint.

## 2021-05-11 ENCOUNTER — Ambulatory Visit (INDEPENDENT_AMBULATORY_CARE_PROVIDER_SITE_OTHER): Payer: Medicare HMO | Admitting: Family Medicine

## 2021-05-18 ENCOUNTER — Other Ambulatory Visit (INDEPENDENT_AMBULATORY_CARE_PROVIDER_SITE_OTHER): Payer: Self-pay | Admitting: Family Medicine

## 2021-05-18 DIAGNOSIS — H02889 Meibomian gland dysfunction of unspecified eye, unspecified eyelid: Secondary | ICD-10-CM | POA: Diagnosis not present

## 2021-05-18 DIAGNOSIS — E559 Vitamin D deficiency, unspecified: Secondary | ICD-10-CM

## 2021-05-18 DIAGNOSIS — R7303 Prediabetes: Secondary | ICD-10-CM

## 2021-05-18 NOTE — Telephone Encounter (Signed)
Dawn 

## 2021-05-18 NOTE — Telephone Encounter (Signed)
LAST APPOINTMENT DATE: 04/21/2021 NEXT APPOINTMENT DATE: 06/08/2021   [id number] [place] Otto,  - [address] AT [facility] [address] [place] [address] Phone: [phone] Fax: [phone]  Patient is requesting a refill of the following medications: Requested Prescriptions   Pending Prescriptions Disp Refills   Vitamin D, Ergocalciferol, (DRISDOL) 1.25 MG (50000 UNIT) CAPS capsule [Pharmacy Med Name: VITAMIN D2 50,000IU (ERGO) CAP RX] 4 capsule 0    Sig: TAKE 1 CAPSULE BY MOUTH EVERY 7 DAYS   OZEMPIC, 0.25 OR 0.5 MG/DOSE, 2 MG/1.5ML SOPN [Pharmacy Med Name: OZEMPIC 0.25 OR 0.5MG /DOS 1X2MG  PEN] 1.5 mL 0    Sig: Inject 0.25 mg into the skin once a week.    Date last filled: 04/21/2021 Previously prescribed by Adah Salvage, FNP  Lab Results  Component Value Date   HGBA1C 5.5 03/10/2021   HGBA1C 5.8 (H) 11/03/2020   HGBA1C 5.9 (H) 06/23/2020   Lab Results  Component Value Date   LDLCALC 89 03/10/2021   CREATININE 0.76 03/10/2021   Lab Results  Component Value Date   VD25OH 46.2 04/21/2021   VD25OH 46.0 11/03/2020   VD25OH 44.5 06/23/2020    BP Readings from Last 3 Encounters:  04/21/21 118/75  03/31/21 108/73  03/10/21 121/75

## 2021-06-08 ENCOUNTER — Encounter (INDEPENDENT_AMBULATORY_CARE_PROVIDER_SITE_OTHER): Payer: Self-pay | Admitting: Family Medicine

## 2021-06-08 ENCOUNTER — Ambulatory Visit (INDEPENDENT_AMBULATORY_CARE_PROVIDER_SITE_OTHER): Payer: Medicare HMO | Admitting: Family Medicine

## 2021-06-08 ENCOUNTER — Other Ambulatory Visit: Payer: Self-pay

## 2021-06-08 VITALS — BP 111/73 | HR 80 | Temp 98.3°F | Ht 59.0 in | Wt 186.0 lb

## 2021-06-08 DIAGNOSIS — Z6841 Body Mass Index (BMI) 40.0 and over, adult: Secondary | ICD-10-CM

## 2021-06-08 DIAGNOSIS — E559 Vitamin D deficiency, unspecified: Secondary | ICD-10-CM | POA: Diagnosis not present

## 2021-06-08 DIAGNOSIS — R7303 Prediabetes: Secondary | ICD-10-CM

## 2021-06-08 DIAGNOSIS — E7849 Other hyperlipidemia: Secondary | ICD-10-CM

## 2021-06-08 MED ORDER — PRAVASTATIN SODIUM 10 MG PO TABS: 10.0000 mg | ORAL_TABLET | Freq: Every day | ORAL | 0 refills | Status: DC

## 2021-06-08 MED ORDER — VITAMIN D (ERGOCALCIFEROL) 1.25 MG (50000 UNIT) PO CAPS: 50000.0000 [IU] | ORAL_CAPSULE | ORAL | 0 refills | Status: DC

## 2021-06-08 MED ORDER — OZEMPIC (1 MG/DOSE) 4 MG/3ML ~~LOC~~ SOPN
1.0000 mg | PEN_INJECTOR | SUBCUTANEOUS | 0 refills | Status: DC
Start: 2021-06-08 — End: 2021-08-03

## 2021-06-09 ENCOUNTER — Encounter (INDEPENDENT_AMBULATORY_CARE_PROVIDER_SITE_OTHER): Payer: Self-pay | Admitting: Family Medicine

## 2021-06-09 NOTE — Progress Notes (Addendum)
Chief Complaint:   OBESITY Darlene [name] is here to discuss her progress with her obesity treatment plan along with follow-up of her obesity related diagnoses. Darlene [name] is on keeping a food journal and adhering to recommended goals of [phone] calories and 80 grams of protein and states she is following her eating plan approximately 60% of the time. Darlene [name] states she is doing 0 minutes 0 times per week.  Today's visit was #: 36 Starting weight: 213 lbs Starting date: 06/04/2019 Today's weight: 186 lbs Today's date: 06/08/2021 Total lbs lost to date: 27 lbs Total lbs lost since last in-office visit: 0  Interim History: Darlene [name] notes she has been eating what she wanted for the past several weeks. She notes she got tired of following a plan and needed a break. She is now ready to get back to journaling. She is scheduled for right total knee replacement on 06/28/2021.  Subjective:   1. Prediabetes Darlene [name]'s last A1C was 5.8. She is on 0.5 mg Ozempic but doesn't feel it is helping much with appetite. She would like to wait until after knee surgery to increase Ozempic dose.  Lab Results  Component Value Date   HGBA1C 5.5 03/10/2021   HGBA1C 5.8 (H) 11/03/2020   HGBA1C 5.9 (H) 06/23/2020   Lab Results  Component Value Date   LDLCALC 89 03/10/2021   CREATININE 0.76 03/10/2021   Lab Results  Component Value Date   INSULIN 5.8 03/10/2021   INSULIN 9.6 11/03/2020   INSULIN 10.5 06/23/2020   INSULIN 12.2 04/01/2020   INSULIN 17.7 09/17/2019    2. Other hyperlipidemia Medication(s) reviewed. Patient denies myalgias. Last LDL at goal.  Lab Results  Component Value Date   CHOL 190 03/10/2021   HDL 85 03/10/2021   LDLCALC 89 03/10/2021   TRIG 91 03/10/2021   CHOLHDL 2.2 03/10/2021   Lab Results  Component Value Date   ALT 13 03/10/2021   AST 18 03/10/2021   ALKPHOS 114 03/10/2021   BILITOT 0.4 03/10/2021   The 10-year ASCVD risk score (Arnett DK, et al., 2019) is: 7.6%    Values used to calculate the score:     Age: 70 years     Sex: Female     Is Non-Hispanic African American: Yes     Diabetic: No     Tobacco smoker: No     Systolic Blood Pressure: 111 mmHg     Is BP treated: Yes     HDL Cholesterol: 85 mg/dL     Total Cholesterol: 190 mg/dL   3. Vitamin D deficiency Darlene [name]'s Vitamin D is nearly at goal (46.2). She is on weekly prescription Vitamin D.  Lab Results  Component Value Date   VD25OH 46.2 04/21/2021   VD25OH 46.0 11/03/2020   VD25OH 44.5 06/23/2020    Assessment/Plan:   1. Prediabetes Darlene [name] agreed to increase dose to Ozempic 1 mg weekly after her surgery Oct. 3.. She will take 0.5 mg (36 clicks).   - Semaglutide, 1 MG/DOSE, (OZEMPIC, 1 MG/DOSE,) 4 MG/3ML SOPN; Inject 1 mg into the skin once a week.  Dispense: 3 mL; Refill: 0  2. Other hyperlipidemia We will refill Pravastatin 10 mg daily for 3 months with no refills.   - pravastatin (PRAVACHOL) 10 MG tablet; Take 1 tablet (10 mg total) by mouth daily.  Dispense: 90 tablet; Refill: 0  3. Vitamin D deficiency  We will refill prescription Vitamin D 50,000 IU every 7 days with no refills and Darlene [name] will follow-up  for routine testing of Vitamin D, at least 2-3 times per year to avoid over-replacement.  - Vitamin D, Ergocalciferol, (DRISDOL) 1.25 MG (50000 UNIT) CAPS capsule; Take 1 capsule (50,000 Units total) by mouth every 7 (seven) days.  Dispense: 12 capsule; Refill: 0  4. Obesity: Current BMI 37.55 Darlene [name] is currently in the action stage of change. As such, her goal is to continue with weight loss efforts. She has agreed to keeping a food journal and adhering to recommended goals of [phone] calories and 80 grams of protein daily.  Exercise goals: No exercise has been prescribed at this time.  Behavioral modification strategies: increasing lean protein intake and decreasing simple carbohydrates.  Darlene [name] has agreed to follow-up with our clinic in 8 weeks.  Objective:    Blood pressure 111/73, pulse 80, temperature 98.3 F (36.8 C), height 4\' 11"  (1.499 m), weight 186 lb (84.4 kg), SpO2 98 %. Body mass index is 37.57 kg/m.  General: Cooperative, alert, well developed, in no acute distress. HEENT: Conjunctivae and lids unremarkable. Cardiovascular: Regular rhythm.  Lungs: Normal work of breathing. Neurologic: No focal deficits.   Lab Results  Component Value Date   CREATININE 0.76 03/10/2021   BUN 15 03/10/2021   NA 140 03/10/2021   K 4.3 03/10/2021   CL 101 03/10/2021   CO2 21 03/10/2021   Lab Results  Component Value Date   ALT 13 03/10/2021   AST 18 03/10/2021   ALKPHOS 114 03/10/2021   BILITOT 0.4 03/10/2021   Lab Results  Component Value Date   HGBA1C 5.5 03/10/2021   HGBA1C 5.8 (H) 11/03/2020   HGBA1C 5.9 (H) 06/23/2020   HGBA1C 5.6 04/01/2020   HGBA1C 5.7 (H) 09/17/2019   Lab Results  Component Value Date   INSULIN 5.8 03/10/2021   INSULIN 9.6 11/03/2020   INSULIN 10.5 06/23/2020   INSULIN 12.2 04/01/2020   INSULIN 17.7 09/17/2019   Lab Results  Component Value Date   TSH 1.620 06/04/2019   Lab Results  Component Value Date   CHOL 190 03/10/2021   HDL 85 03/10/2021   LDLCALC 89 03/10/2021   TRIG 91 03/10/2021   CHOLHDL 2.2 03/10/2021   Lab Results  Component Value Date   VD25OH 46.2 04/21/2021   VD25OH 46.0 11/03/2020   VD25OH 44.5 06/23/2020   Lab Results  Component Value Date   WBC 6.2 11/03/2020   HGB 13.4 11/03/2020   HCT 41.2 11/03/2020   MCV 81 11/03/2020   PLT 338 11/03/2020   No results found for: IRON, TIBC, FERRITIN  Attestation Statements:   Reviewed by clinician on day of visit: allergies, medications, problem list, medical history, surgical history, family history, social history, and previous encounter notes.   I, 01/01/2021, RMA, am acting as [place] for Energy manager, FNP.   I have reviewed the above documentation for accuracy and completeness, and I agree with the  above. -  [place], FNP

## 2021-06-15 ENCOUNTER — Other Ambulatory Visit (INDEPENDENT_AMBULATORY_CARE_PROVIDER_SITE_OTHER): Payer: Self-pay | Admitting: Family Medicine

## 2021-06-15 DIAGNOSIS — R7303 Prediabetes: Secondary | ICD-10-CM

## 2021-06-15 NOTE — Telephone Encounter (Signed)
Darlene 
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
## 2021-06-18 ENCOUNTER — Other Ambulatory Visit: Payer: Self-pay | Admitting: Family Medicine

## 2021-06-18 ENCOUNTER — Ambulatory Visit
Admission: RE | Admit: 2021-06-18 | Discharge: 2021-06-18 | Disposition: A | Discharge status: Home / Self Care | Payer: Medicare HMO | Source: Ambulatory Visit | Attending: Family Medicine | Admitting: Family Medicine

## 2021-06-18 ENCOUNTER — Other Ambulatory Visit: Payer: Self-pay

## 2021-06-18 DIAGNOSIS — Z01818 Encounter for other preprocedural examination: Secondary | ICD-10-CM

## 2021-06-18 DIAGNOSIS — Z96651 Presence of right artificial knee joint: Secondary | ICD-10-CM | POA: Diagnosis not present

## 2021-06-18 DIAGNOSIS — Z471 Aftercare following joint replacement surgery: Secondary | ICD-10-CM | POA: Diagnosis not present

## 2021-06-18 DIAGNOSIS — M419 Scoliosis, unspecified: Secondary | ICD-10-CM | POA: Diagnosis not present

## 2021-06-18 NOTE — Progress Notes (Signed)
DUE TO COVID-19 ONLY ONE VISITOR IS ALLOWED TO COME WITH YOU AND STAY IN THE WAITING ROOM ONLY DURING PRE OP AND PROCEDURE DAY OF SURGERY.  2 VISITOR  MAY VISIT WITH YOU AFTER SURGERY IN YOUR PRIVATE ROOM DURING VISITING HOURS ONLY!  YOU NEED TO HAVE A COVID 19 TEST ON__  06/24/21 @_  @_from  8am-3pm _____, THIS TEST MUST BE DONE BEFORE SURGERY,  Covid test is done at [address] [place], [address].  This is a drive thru.  No appt required. Please see map.                 Your procedure is scheduled on:  06/28/2021   Report to [facility] Main  Entrance   Report to admitting at    [phone]     Call this number if you have problems the morning of surgery [phone]    REMEMBER: NO  SOLID FOOD CANDY OR GUM AFTER MIDNIGHT. CLEAR LIQUIDS UNTIL     0615am       . NOTHING BY MOUTH EXCEPT CLEAR LIQUIDS UNTIL    0615am   . PLEASE FINISH ENSURE DRINK PER SURGEON ORDER  WHICH NEEDS TO BE COMPLETED AT      . [id number]      CLEAR LIQUID DIET   Foods Allowed                                                                    Coffee and tea, regular and decaf                            Fruit ices (not with fruit pulp)                                      Iced Popsicles                                    Carbonated beverages, regular and diet                                    Cranberry, grape and apple juices Sports drinks like Gatorade Lightly seasoned clear broth or consume(fat free) Sugar, honey syrup ___________________________________________________________________      BRUSH YOUR TEETH MORNING OF SURGERY AND RINSE YOUR MOUTH OUT, NO CHEWING GUM CANDY OR MINTS.     Take these medicines the morning of surgery with A SIP OF WATER:  none   DO NOT TAKE ANY DIABETIC MEDICATIONS DAY OF YOUR SURGERY                               You may not have any metal on your body including hair pins and              piercings  Do not wear jewelry, make-up, lotions, powders or  perfumes, deodorant  Do not wear nail polish on your fingernails.  Do not shave  48 hours prior to surgery.              Men may shave face and neck.   Do not bring valuables to the hospital. [place].  Contacts, dentures or bridgework may not be worn into surgery.  Leave suitcase in the car. After surgery it may be brought to your room.     Patients discharged the day of surgery will not be allowed to drive home. IF YOU ARE HAVING SURGERY AND GOING HOME THE SAME DAY, YOU MUST HAVE AN ADULT TO DRIVE YOU HOME AND BE WITH YOU FOR 24 HOURS. YOU MAY GO HOME BY TAXI OR UBER OR ORTHERWISE, BUT AN ADULT MUST ACCOMPANY YOU HOME AND STAY WITH YOU FOR 24 HOURS.  Name and phone number of your driver:  Special Instructions: N/A              Please read over the following fact sheets you were given: _____________________________________________________________________  [facility] - Preparing for Surgery Before surgery, you can play an important role.  Because skin is not sterile, your skin needs to be as free of germs as possible.  You can reduce the number of germs on your skin by washing with CHG (chlorahexidine gluconate) soap before surgery.  CHG is an antiseptic cleaner which kills germs and bonds with the skin to continue killing germs even after washing. Please DO NOT use if you have an allergy to CHG or antibacterial soaps.  If your skin becomes reddened/irritated stop using the CHG and inform your nurse when you arrive at Short Stay. Do not shave (including legs and underarms) for at least 48 hours prior to the first CHG shower.  You may shave your face/neck. Please follow these instructions carefully:  1.  Shower with CHG Soap the night before surgery and the  morning of Surgery.  2.  If you choose to wash your hair, wash your hair first as usual with your  normal  shampoo.  3.  After you shampoo, rinse your hair and body thoroughly  to remove the  shampoo.                           4.  Use CHG as you would any other liquid soap.  You can apply chg directly  to the skin and wash                       Gently with a scrungie or clean washcloth.  5.  Apply the CHG Soap to your body ONLY FROM THE NECK DOWN.   Do not use on face/ open                           Wound or open sores. Avoid contact with eyes, ears mouth and genitals (private parts).                       Wash face,  Genitals (private parts) with your normal soap.             6.  Wash thoroughly, paying special attention to the area where your surgery  will be performed.  7.  Thoroughly rinse your body with  warm water from the neck down.  8.  DO NOT shower/wash with your normal soap after using and rinsing off  the CHG Soap.                9.  Pat yourself dry with a clean towel.            10.  Wear clean pajamas.            11.  Place clean sheets on your bed the night of your first shower and do not  sleep with pets. Day of Surgery : Do not apply any lotions/deodorants the morning of surgery.  Please wear clean clothes to the hospital/surgery center.  FAILURE TO FOLLOW THESE INSTRUCTIONS MAY RESULT IN THE CANCELLATION OF YOUR SURGERY PATIENT SIGNATURE_________________________________  NURSE SIGNATURE__________________________________  ________________________________________________________________________

## 2021-06-18 NOTE — Progress Notes (Signed)
Anesthesia Review:  PCP:  Cardiologist : Chest x-ray : EKG : [facility] : Stress test: Cardiac Cath :  Activity level:  Sleep Study/ CPAP : Fasting Blood Sugar :      / Checks Blood Sugar -- times a day:   Blood Thinner/ Instructions /Last Dose: ASA / Instructions/ Last Dose :   DM-  Hgba1c-06/22/21

## 2021-06-22 ENCOUNTER — Encounter (HOSPITAL_COMMUNITY)
Admission: RE | Admit: 2021-06-22 | Discharge: 2021-06-22 | Disposition: A | Discharge status: Home / Self Care | Payer: Medicare HMO | Source: Ambulatory Visit | Attending: Orthopedic Surgery | Admitting: Orthopedic Surgery

## 2021-06-28 ENCOUNTER — Encounter (HOSPITAL_COMMUNITY): Admission: RE | Payer: Self-pay | Source: Home / Self Care

## 2021-06-28 ENCOUNTER — Ambulatory Visit (HOSPITAL_COMMUNITY): Admission: RE | Admit: 2021-06-28 | Payer: Medicare HMO | Source: Home / Self Care | Admitting: Orthopedic Surgery

## 2021-06-28 SURGERY — ARTHROPLASTY, KNEE, TOTAL
Anesthesia: Choice | Site: Knee | Laterality: Right

## 2021-07-10 ENCOUNTER — Other Ambulatory Visit (INDEPENDENT_AMBULATORY_CARE_PROVIDER_SITE_OTHER): Payer: Self-pay | Admitting: Family Medicine

## 2021-07-10 DIAGNOSIS — R7303 Prediabetes: Secondary | ICD-10-CM

## 2021-07-12 NOTE — Telephone Encounter (Signed)
Last seen by Dawn  

## 2021-07-12 NOTE — Telephone Encounter (Signed)
Mychart message sent to patient.

## 2021-07-16 DIAGNOSIS — Z23 Encounter for immunization: Secondary | ICD-10-CM | POA: Diagnosis not present

## 2021-07-16 DIAGNOSIS — Z01818 Encounter for other preprocedural examination: Secondary | ICD-10-CM | POA: Diagnosis not present

## 2021-08-03 ENCOUNTER — Other Ambulatory Visit: Payer: Self-pay

## 2021-08-03 ENCOUNTER — Ambulatory Visit (INDEPENDENT_AMBULATORY_CARE_PROVIDER_SITE_OTHER): Payer: Medicare HMO | Admitting: Family Medicine

## 2021-08-03 ENCOUNTER — Encounter (INDEPENDENT_AMBULATORY_CARE_PROVIDER_SITE_OTHER): Payer: Self-pay | Admitting: Family Medicine

## 2021-08-03 VITALS — BP 123/77 | HR 81 | Temp 98.1°F | Ht 59.0 in | Wt 186.0 lb

## 2021-08-03 DIAGNOSIS — R7303 Prediabetes: Secondary | ICD-10-CM

## 2021-08-03 DIAGNOSIS — M1711 Unilateral primary osteoarthritis, right knee: Secondary | ICD-10-CM

## 2021-08-03 DIAGNOSIS — Z6841 Body Mass Index (BMI) 40.0 and over, adult: Secondary | ICD-10-CM | POA: Diagnosis not present

## 2021-08-03 MED ORDER — OZEMPIC (1 MG/DOSE) 4 MG/3ML ~~LOC~~ SOPN: 1.0000 mg | PEN_INJECTOR | SUBCUTANEOUS | 0 refills | Status: DC

## 2021-08-03 NOTE — Progress Notes (Signed)
Chief Complaint:   OBESITY Darlene [name] is here to discuss her progress with her obesity treatment plan along with follow-up of her obesity related diagnoses. Darlene [name] is on keeping a food journal and adhering to recommended goals of [id number] calories and 80 grams of protein and states she is following her eating plan approximately 50% of the time. Darlene [name] states she is doing 0 minutes 0 times per week.  Today's visit was #: 37 Starting weight: 213 lbs Starting date: 06/04/2019 Today's weight: 186 lbs Today's date: 08/03/2021 Total lbs lost to date: 27 lbs Total lbs lost since last in-office visit: 0  Interim History: Darlene [name] is very tired of eating the food on the plan. She feels her appetite is up and down. She is frustrated with lack of weight loss. She has not been focused on her meal plan recently due to family issues.   Subjective:   1. Prediabetes Darlene [name]'s last A1C was 5.5. She is on Ozempic. Her dose was increased to 1 mg weekly. She does not feel it helps with appetite but does not want to discontinue it.  Lab Results  Component Value Date   HGBA1C 5.5 03/10/2021   Lab Results  Component Value Date   INSULIN 5.8 03/10/2021   INSULIN 9.6 11/03/2020   INSULIN 10.5 06/23/2020   INSULIN 12.2 04/01/2020   INSULIN 17.7 09/17/2019    2. Osteoarthritis of right knee, unspecified osteoarthritis type Darlene [name] has right total knee replacement planned for 10-18-2021. She postponed it due to family issues.   Assessment/Plan:   1. Prediabetes We will refill Ozempic 1 mg weekly. We will check A1C on next visit. Darlene [name] will continue to work on weight loss, exercise, and decreasing simple carbohydrates to help decrease the risk of diabetes.   - Semaglutide, 1 MG/DOSE, (OZEMPIC, 1 MG/DOSE,) 4 MG/3ML SOPN; Inject 1 mg into the skin once a week.  Dispense: 3 mL; Refill: 0  2. Osteoarthritis of right knee, unspecified osteoarthritis type  Darlene [name] will follow up with orthopedic.     3. Obesity: Current BMI 37.55 Darlene [name] is currently in the action stage of change. As such, her goal is to continue with weight loss efforts. She has agreed to practicing portion control and making smarter food choices, such as increasing vegetables and decreasing simple carbohydrates and 8-10 ounces of meat or meat exchanges per day.  Exercise goals: No exercise has been prescribed at this time.  Behavioral modification strategies: increasing lean protein intake and planning for success.  Darlene [name] has agreed to follow-up with our clinic in 3 weeks (fasting).  Objective:   Blood pressure 123/77, pulse 81, temperature 98.1 F (36.7 C), height 4\' 11"  (1.499 m), weight 186 lb (84.4 kg), SpO2 98 %. Body mass index is 37.57 kg/m.  General: Cooperative, alert, well developed, in no acute distress. HEENT: Conjunctivae and lids unremarkable. Cardiovascular: Regular rhythm.  Lungs: Normal work of breathing. Neurologic: No focal deficits.   Lab Results  Component Value Date   CREATININE 0.76 03/10/2021   BUN 15 03/10/2021   NA 140 03/10/2021   K 4.3 03/10/2021   CL 101 03/10/2021   CO2 21 03/10/2021   Lab Results  Component Value Date   ALT 13 03/10/2021   AST 18 03/10/2021   ALKPHOS 114 03/10/2021   BILITOT 0.4 03/10/2021   Lab Results  Component Value Date   HGBA1C 5.5 03/10/2021   HGBA1C 5.8 (H) 11/03/2020   HGBA1C 5.9 (H) 06/23/2020   HGBA1C 5.6 04/01/2020  HGBA1C 5.7 (H) 09/17/2019   Lab Results  Component Value Date   INSULIN 5.8 03/10/2021   INSULIN 9.6 11/03/2020   INSULIN 10.5 06/23/2020   INSULIN 12.2 04/01/2020   INSULIN 17.7 09/17/2019   Lab Results  Component Value Date   TSH 1.620 06/04/2019   Lab Results  Component Value Date   CHOL 190 03/10/2021   HDL 85 03/10/2021   LDLCALC 89 03/10/2021   TRIG 91 03/10/2021   CHOLHDL 2.2 03/10/2021   Lab Results  Component Value Date   VD25OH 46.2 04/21/2021   VD25OH 46.0 11/03/2020   VD25OH 44.5  06/23/2020   Lab Results  Component Value Date   WBC 6.2 11/03/2020   HGB 13.4 11/03/2020   HCT 41.2 11/03/2020   MCV 81 11/03/2020   PLT 338 11/03/2020   No results found for: IRON, TIBC, FERRITIN  Attestation Statements:   Reviewed by clinician on day of visit: allergies, medications, problem list, medical history, surgical history, family history, social history, and previous encounter notes.  I, Lizbeth Bark, RMA, am acting as Location manager for [employer], [place].   I have reviewed the above documentation for accuracy and completeness, and I agree with the above. -  Georgianne Fick, FNP

## 2021-08-06 DIAGNOSIS — I951 Orthostatic hypotension: Secondary | ICD-10-CM | POA: Diagnosis not present

## 2021-08-06 DIAGNOSIS — J0100 Acute maxillary sinusitis, unspecified: Secondary | ICD-10-CM | POA: Diagnosis not present

## 2021-08-25 ENCOUNTER — Encounter (INDEPENDENT_AMBULATORY_CARE_PROVIDER_SITE_OTHER): Payer: Self-pay | Admitting: Family Medicine

## 2021-08-25 ENCOUNTER — Ambulatory Visit (INDEPENDENT_AMBULATORY_CARE_PROVIDER_SITE_OTHER): Payer: Medicare HMO | Admitting: Family Medicine

## 2021-08-25 ENCOUNTER — Other Ambulatory Visit: Payer: Self-pay

## 2021-08-25 VITALS — BP 123/75 | HR 77 | Temp 97.4°F | Ht 59.0 in | Wt 183.0 lb

## 2021-08-25 DIAGNOSIS — Z6841 Body Mass Index (BMI) 40.0 and over, adult: Secondary | ICD-10-CM

## 2021-08-25 DIAGNOSIS — R7303 Prediabetes: Secondary | ICD-10-CM | POA: Diagnosis not present

## 2021-08-25 DIAGNOSIS — E7849 Other hyperlipidemia: Secondary | ICD-10-CM | POA: Diagnosis not present

## 2021-08-25 DIAGNOSIS — E559 Vitamin D deficiency, unspecified: Secondary | ICD-10-CM | POA: Diagnosis not present

## 2021-08-25 DIAGNOSIS — Z6837 Body mass index (BMI) 37.0-37.9, adult: Secondary | ICD-10-CM | POA: Diagnosis not present

## 2021-08-25 DIAGNOSIS — Z01818 Encounter for other preprocedural examination: Secondary | ICD-10-CM

## 2021-08-25 DIAGNOSIS — R710 Precipitous drop in hematocrit: Secondary | ICD-10-CM | POA: Diagnosis not present

## 2021-08-25 MED ORDER — VITAMIN D (ERGOCALCIFEROL) 1.25 MG (50000 UNIT) PO CAPS: 50000.0000 [IU] | ORAL_CAPSULE | ORAL | 0 refills | Status: DC

## 2021-08-25 MED ORDER — OZEMPIC (1 MG/DOSE) 4 MG/3ML ~~LOC~~ SOPN: 1.0000 mg | PEN_INJECTOR | SUBCUTANEOUS | 0 refills | Status: DC

## 2021-08-25 NOTE — Progress Notes (Signed)
Chief Complaint:   OBESITY Darlene [name] is here to discuss her progress with her obesity treatment plan along with follow-up of her obesity related diagnoses. Darlene [name] is on practicing portion control and making smarter food choices, such as increasing vegetables and decreasing simple carbohydrates and states she is following her eating plan approximately 80-90% of the time. Darlene [name] states she is doing squats, arm raises, legs raises and walking up and down the hallway for 20 minutes 3 times per week.  Today's visit was #: 38 Starting weight: 213 lbs Starting date: 06/04/2019 Today's weight: 183 lbs Today's date: 08/25/2021 Total lbs lost to date: 30 lbs Total lbs lost since last in-office visit: 3 lbs  Interim History: Darlene [name] has exercised and journaled consistently since last OV. She is very happy with 3 lbs weight loss today. She is meeting her calorie and protein goals.  She is preparing to have right TKR Oct 18, 2021 by Dr. Lequita Halt. She had her left knee done on Oct. 2021 and did quite well.  Subjective:   1. Other hyperlipidemia Darlene [name]'s LDL was at goal 84 (03/10/21). LDL was elevated in Feb 2022 and we started her on statin therapy (atorvastatin) but then switched her to pravastatin due to myalgias. She reports she takes it QOD rather than daily.    Lab Results  Component Value Date   CHOL 190 03/10/2021   HDL 85 03/10/2021   LDLCALC 89 03/10/2021   TRIG 91 03/10/2021   CHOLHDL 2.2 03/10/2021   Lab Results  Component Value Date   ALT 13 03/10/2021   AST 18 03/10/2021   ALKPHOS 114 03/10/2021   BILITOT 0.4 03/10/2021   The 10-year ASCVD risk score (Arnett DK, et al., 2019) is: 9.3%   Values used to calculate the score:     Age: 70 years     Sex: Female     Is Non-Hispanic African American: Yes     Diabetic: No     Tobacco smoker: No     Systolic Blood Pressure: 123 mmHg     Is BP treated: Yes     HDL Cholesterol: 85 mg/dL     Total Cholesterol: 190 mg/dL   2.  Preop testing Kya has total knee replacement scheduled for 10-18-2021.  3. Prediabetes Darlene [name] is on Ozempic 1 mg. Her appetite is well controlled.  Lab Results  Component Value Date   HGBA1C 5.5 03/10/2021   Lab Results  Component Value Date   INSULIN 5.8 03/10/2021   INSULIN 9.6 11/03/2020   INSULIN 10.5 06/23/2020   INSULIN 12.2 04/01/2020   INSULIN 17.7 09/17/2019    4. Vitamin D deficiency Darlene [name]'s Vitamin D is nearly at goal. She is on weekly prescription Vitamin D.   Lab Results  Component Value Date   VD25OH 46.2 04/21/2021   VD25OH 46.0 11/03/2020   VD25OH 44.5 06/23/2020    Assessment/Plan:   1. Other hyperlipidemia Cardiovascular risk and specific lipid/LDL goals reviewed.  We discussed several lifestyle modifications today and Darlene [name] will continue to work on diet, exercise and weight loss efforts. We will check FLP today.  - Lipid Panel With LDL/HDL Ratio  2. Preop testing We will check CBC today.   - CBC with Differential/Platelet  3. Prediabetes We will refill Ozempic 1 mg weekly. We will check labs today.   - Comprehensive metabolic panel - Hemoglobin A1c - Insulin, random - Semaglutide, 1 MG/DOSE, (OZEMPIC, 1 MG/DOSE,) 4 MG/3ML SOPN; Inject 1 mg into the skin once a  week.  Dispense: 3 mL; Refill: 0  4. Vitamin D deficiency We will check labs today. Darlene [name] agrees to continue to take prescription Vitamin D 50,000 IU every week and she will follow-up for routine testing of Vitamin D, at least 2-3 times per year to avoid over-replacement.  - VITAMIN D 25 Hydroxy (Vit-D Deficiency, Fractures) - Vitamin D, Ergocalciferol, (DRISDOL) 1.25 MG (50000 UNIT) CAPS capsule; Take 1 capsule (50,000 Units total) by mouth every 7 (seven) days.  Dispense: 12 capsule; Refill: 0  5. Obesity: Current BMI 36.94 Darlene [name] is currently in the action stage of change. As such, her goal is to continue with weight loss efforts. She has agreed to keeping a food journal  and adhering to recommended goals of [phone] calories and 80 grams of protein daily.   Exercise goals:  As is.  Behavioral modification strategies: planning for success and keeping a strict food journal.  Darlene [name] has agreed to follow-up with our clinic in 2-3 weeks with Abby Potash, PA-C or Mina Marble, NP.   Objective:   Blood pressure 123/75, pulse 77, temperature (!) 97.4 F (36.3 C), height 4\' 11"  (1.499 m), weight 183 lb (83 kg), SpO2 98 %. Body mass index is 36.96 kg/m.  General: Cooperative, alert, well developed, in no acute distress. HEENT: Conjunctivae and lids unremarkable. Cardiovascular: Regular rhythm.  Lungs: Normal work of breathing. Neurologic: No focal deficits.   Lab Results  Component Value Date   CREATININE 0.76 03/10/2021   BUN 15 03/10/2021   NA 140 03/10/2021   K 4.3 03/10/2021   CL 101 03/10/2021   CO2 21 03/10/2021   Lab Results  Component Value Date   ALT 13 03/10/2021   AST 18 03/10/2021   ALKPHOS 114 03/10/2021   BILITOT 0.4 03/10/2021   Lab Results  Component Value Date   HGBA1C 5.5 03/10/2021   HGBA1C 5.8 (H) 11/03/2020   HGBA1C 5.9 (H) 06/23/2020   HGBA1C 5.6 04/01/2020   HGBA1C 5.7 (H) 09/17/2019   Lab Results  Component Value Date   INSULIN 5.8 03/10/2021   INSULIN 9.6 11/03/2020   INSULIN 10.5 06/23/2020   INSULIN 12.2 04/01/2020   INSULIN 17.7 09/17/2019   Lab Results  Component Value Date   TSH 1.620 06/04/2019   Lab Results  Component Value Date   CHOL 190 03/10/2021   HDL 85 03/10/2021   LDLCALC 89 03/10/2021   TRIG 91 03/10/2021   CHOLHDL 2.2 03/10/2021   Lab Results  Component Value Date   VD25OH 46.2 04/21/2021   VD25OH 46.0 11/03/2020   VD25OH 44.5 06/23/2020   Lab Results  Component Value Date   WBC 6.2 11/03/2020   HGB 13.4 11/03/2020   HCT 41.2 11/03/2020   MCV 81 11/03/2020   PLT 338 11/03/2020   No results found for: IRON, TIBC, FERRITIN  Attestation Statements:   Reviewed by  clinician on day of visit: allergies, medications, problem list, medical history, surgical history, family history, social history, and previous encounter notes.  I, Lizbeth Bark, RMA, am acting as Location manager for [employer], [place].   I have reviewed the above documentation for accuracy and completeness, and I agree with the above. -  Georgianne Fick, FNP

## 2021-08-26 ENCOUNTER — Encounter (INDEPENDENT_AMBULATORY_CARE_PROVIDER_SITE_OTHER): Payer: Self-pay | Admitting: Family Medicine

## 2021-08-26 LAB — CBC WITH DIFFERENTIAL/PLATELET
Basophils Absolute: 0 10*3/uL (ref 0.0–0.2)
Basos: 1 %
EOS (ABSOLUTE): 0.1 10*3/uL (ref 0.0–0.4)
Eos: 1 %
Hematocrit: 41.2 % (ref 34.0–46.6)
Hemoglobin: 13.1 g/dL (ref 11.1–15.9)
Immature Grans (Abs): 0 10*3/uL (ref 0.0–0.1)
Immature Granulocytes: 0 %
Lymphocytes Absolute: 2.4 10*3/uL (ref 0.7–3.1)
Lymphs: 48 %
MCH: 27.1 pg (ref 26.6–33.0)
MCHC: 31.8 g/dL (ref 31.5–35.7)
MCV: 85 fL (ref 79–97)
Monocytes Absolute: 0.4 10*3/uL (ref 0.1–0.9)
Monocytes: 8 %
Neutrophils Absolute: 2.2 10*3/uL (ref 1.4–7.0)
Neutrophils: 42 %
Platelets: 333 10*3/uL (ref 150–450)
RBC: 4.84 x10E6/uL (ref 3.77–5.28)
RDW: 12.8 % (ref 11.7–15.4)
WBC: 5.2 10*3/uL (ref 3.4–10.8)

## 2021-08-26 LAB — COMPREHENSIVE METABOLIC PANEL
ALT: 8 IU/L (ref 0–32)
AST: 14 IU/L (ref 0–40)
Albumin/Globulin Ratio: 2.1 (ref 1.2–2.2)
Albumin: 4.8 g/dL (ref 3.8–4.8)
Alkaline Phosphatase: 107 IU/L (ref 44–121)
BUN/Creatinine Ratio: 20 (ref 12–28)
BUN: 15 mg/dL (ref 8–27)
Bilirubin Total: 0.4 mg/dL (ref 0.0–1.2)
CO2: 25 mmol/L (ref 20–29)
Calcium: 9.6 mg/dL (ref 8.7–10.3)
Chloride: 99 mmol/L (ref 96–106)
Creatinine, Ser: 0.75 mg/dL (ref 0.57–1.00)
Globulin, Total: 2.3 g/dL (ref 1.5–4.5)
Glucose: 83 mg/dL (ref 70–99)
Potassium: 4.3 mmol/L (ref 3.5–5.2)
Sodium: 138 mmol/L (ref 134–144)
Total Protein: 7.1 g/dL (ref 6.0–8.5)
eGFR: 87 mL/min/{1.73_m2} (ref >59)

## 2021-08-26 LAB — HEMOGLOBIN A1C
Est. average glucose Bld gHb Est-mCnc: 120 mg/dL
Hgb A1c MFr Bld: 5.8 % — ABNORMAL HIGH (ref 4.8–5.6)

## 2021-08-26 LAB — LIPID PANEL WITH LDL/HDL RATIO
Cholesterol, Total: 236 mg/dL — ABNORMAL HIGH (ref 100–199)
HDL: 78 mg/dL (ref >39)
LDL Chol Calc (NIH): 142 mg/dL — ABNORMAL HIGH (ref 0–99)
LDL/HDL Ratio: 1.8 ratio (ref 0.0–3.2)
Triglycerides: 94 mg/dL (ref 0–149)
VLDL Cholesterol Cal: 16 mg/dL (ref 5–40)

## 2021-08-26 LAB — INSULIN, RANDOM: INSULIN: 8.4 u[IU]/mL (ref 2.6–24.9)

## 2021-08-26 LAB — VITAMIN D 25 HYDROXY (VIT D DEFICIENCY, FRACTURES): Vit D, 25-Hydroxy: 77.4 ng/mL (ref 30.0–100.0)

## 2021-08-28 ENCOUNTER — Other Ambulatory Visit (INDEPENDENT_AMBULATORY_CARE_PROVIDER_SITE_OTHER): Payer: Self-pay | Admitting: Family Medicine

## 2021-08-28 DIAGNOSIS — R7303 Prediabetes: Secondary | ICD-10-CM

## 2021-08-30 ENCOUNTER — Encounter (INDEPENDENT_AMBULATORY_CARE_PROVIDER_SITE_OTHER): Payer: Self-pay | Admitting: Family Medicine

## 2021-09-09 ENCOUNTER — Other Ambulatory Visit (INDEPENDENT_AMBULATORY_CARE_PROVIDER_SITE_OTHER): Payer: Self-pay | Admitting: Family Medicine

## 2021-09-09 DIAGNOSIS — R7303 Prediabetes: Secondary | ICD-10-CM

## 2021-09-09 NOTE — Telephone Encounter (Signed)
LOV w/ Dawn

## 2021-09-14 ENCOUNTER — Ambulatory Visit (INDEPENDENT_AMBULATORY_CARE_PROVIDER_SITE_OTHER): Payer: Medicare HMO | Admitting: Adult Health

## 2021-09-14 DIAGNOSIS — H02889 Meibomian gland dysfunction of unspecified eye, unspecified eyelid: Secondary | ICD-10-CM | POA: Diagnosis not present

## 2021-09-14 MED ORDER — OZEMPIC (1 MG/DOSE) 4 MG/3ML ~~LOC~~ SOPN: 1.0000 mg | PEN_INJECTOR | SUBCUTANEOUS | 0 refills | Status: DC

## 2021-09-14 NOTE — Telephone Encounter (Signed)
LAST APPOINTMENT DATE: 08/25/21 NEXT APPOINTMENT DATE: Canceled today's appt, [place] 10/07/21   [id number] [place], [place] - [address] AT [facility] OF [facility] [address] [place] [address] Phone: [phone] Fax: [phone]  Patient is requesting a refill of the following medications: Requested Prescriptions   Pending Prescriptions Disp Refills   Semaglutide, 1 MG/DOSE, (OZEMPIC, 1 MG/DOSE,) 4 MG/3ML SOPN 3 mL 0    Sig: Inject 1 mg into the skin once a week.    Date last filled: 08/25/21 Previously prescribed by [facility]  Lab Results  Component Value Date   HGBA1C 5.8 (H) 08/25/2021   HGBA1C 5.5 03/10/2021   HGBA1C 5.8 (H) 11/03/2020   Lab Results  Component Value Date   LDLCALC 142 (H) 08/25/2021   CREATININE 0.75 08/25/2021   Lab Results  Component Value Date   VD25OH 77.4 08/25/2021   VD25OH 46.2 04/21/2021   VD25OH 46.0 11/03/2020    BP Readings from Last 3 Encounters:  08/25/21 123/75  08/03/21 123/77  06/08/21 111/73

## 2021-09-20 ENCOUNTER — Other Ambulatory Visit (INDEPENDENT_AMBULATORY_CARE_PROVIDER_SITE_OTHER): Payer: Self-pay | Admitting: Family Medicine

## 2021-09-20 DIAGNOSIS — E7849 Other hyperlipidemia: Secondary | ICD-10-CM

## 2021-09-21 ENCOUNTER — Encounter (INDEPENDENT_AMBULATORY_CARE_PROVIDER_SITE_OTHER): Payer: Self-pay

## 2021-09-21 NOTE — Telephone Encounter (Signed)
Msg sent to pt 
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
## 2021-10-06 NOTE — Patient Instructions (Addendum)
DUE TO COVID-19 ONLY ONE VISITOR IS ALLOWED TO COME WITH YOU AND STAY IN THE WAITING ROOM ONLY DURING PRE OP AND PROCEDURE DAY OF SURGERY IF YOU ARE GOING HOME AFTER SURGERY. IF YOU ARE SPENDING THE NIGHT 2 PEOPLE MAY VISIT WITH YOU IN YOUR PRIVATE ROOM AFTER SURGERY UNTIL VISITING  HOURS ARE OVER AT 800 PM AND 1  VISITOR  MAY  SPEND THE NIGHT.   YOU NEED TO HAVE A COVID 19 TEST ON__1/19/23_____ @___9 :45____, THIS TEST MUST BE DONE BEFORE SURGERY,                Nancy MarusBarbara Izetta Kamiya     Your procedure is scheduled on: 10/18/21   [facility] Main  Entr[facility]   Report to admitting at  7:45 AM     Call this number if you have problems the morning of surgery [phone]    No food after midnight.    You may have clear liquid until 7:30 AM.    At 7:15 AM drink pre surgery drink.   Nothing by mouth after 7:30 AM.   CLEAR LIQUID DIET   Foods Allowed                                                                     Foods Excluded  Coffee and tea, regular and decaf                             liquids that you cannot  Plain Jell-O any favor except red or purple                                           see through such as: Fruit ices (not with fruit pulp)                                     milk, soups, orange juice  Iced Popsicles                                    All solid food Carbonated beverages, regular and diet                                    Cranberry, grape and apple juices Sports drinks like Gatorade Lightly seasoned clear broth or consume(fat free) Sugar   BRUSH YOUR TEETH MORNING OF SURGERY AND RINSE YOUR MOUTH OUT, NO CHEWING GUM CANDY OR MINTS.     Take these medicines the morning of surgery with A SIP OF WATER: none  How to Manage Your Diabetes Before and After Surgery  Why is it important to control my blood sugar before and after surgery? Improving blood sugar levels before and after surgery helps healing and can limit problems. A way  of improving blood sugar control is eating a healthy diet by:  Eating less sugar and carbohydrates  Increasing activity/exercise  Talking with your doctor about reaching your blood sugar goals High blood sugars (greater than 180 mg/dL) can raise your risk of infections and slow your recovery, so you will need to focus on controlling your diabetes during the weeks before surgery. Make sure that the doctor who takes care of your diabetes knows about your planned surgery including the date and location.  How do I manage my blood sugar before surgery? Check your blood sugar at least 4 times a day, starting 2 days before surgery, to make sure that the level is not too high or low. Check your blood sugar the morning of your surgery when you wake up and every 2 hours until you get to the Short Stay unit. If your blood sugar is less than 70 mg/dL, you will need to treat for low blood sugar: Do not take insulin. Treat a low blood sugar (less than 70 mg/dL) with  cup of clear juice (cranberry or apple), 4 glucose tablets, OR glucose gel. Recheck blood sugar in 15 minutes after treatment (to make sure it is greater than 70 mg/dL). If your blood sugar is not greater than 70 mg/dL on recheck, call [phone] for further instructions. Report your blood sugar to the short stay nurse when you get to Short Stay.  If you are admitted to the hospital after surgery: Your blood sugar will be checked by the staff and you will probably be given insulin after surgery (instead of oral diabetes medicines) to make sure you have good blood sugar levels. The goal for blood sugar control after surgery is 80-180 mg/dL.   WHAT DO I DO ABOUT MY DIABETES MEDICATION?  Do not take oral diabetes medicines (pills) the morning of surgery.     Do not take Ozempic day of surgery                                 You may not have any metal on your body including hair pins and              piercings  Do not wear jewelry,  make-up, lotions, powders or perfumes, deodorant             Do not wear nail polish on your fingernails or toes .  Do not shave  48 hours prior to surgery.             .   Do not bring valuables to the hospital. [facility] IS NOT             RESPONSIBLE   FOR VALUABLES.  Contacts, dentures or bridgework may not be worn into surgery.  Leave suitcase in the car. After surgery it may be brought to your room.    _____________________________________________________________________             [facility] Pre[facility] Before surgery, you can play an important role.  Because skin is not sterile, your skin needs to be as free of germs as possible.  You can reduce the number of germs on your skin by washing with CHG (chlorahexidine gluconate) soap before surgery.  CHG is an antiseptic cleaner which kills germs and bonds with the skin to continue killing germs even after washing. Please DO NOT use if you have an allergy to CHG or antibacterial soaps.  If your skin becomes reddened/irritated stop using the CHG and inform your nurse when you  arrive at Short Stay. Do not shave (including legs and underarms) for at least 48 hours prior to the first CHG shower.  Please follow these instructions carefully:  1.  Shower with CHG Soap the night before surgery and the  morning of Surgery.  2.  If you choose to wash your hair, wash your hair first as usual with your  normal  shampoo.  3.  After you shampoo, rinse your hair and body thoroughly to remove the  shampoo.                            4.  Use CHG as you would any other liquid soap.  You can apply chg directly  to the skin and wash                       Gently with a scrungie or clean washcloth.  5.  Apply the CHG Soap to your body ONLY FROM THE NECK DOWN.   Do not use on face/ open                           Wound or open sores. Avoid contact with eyes, ears mouth and genitals (private parts).                       Wash face,  Genitals  (private parts) with your normal soap.             6.  Wash thoroughly, paying special attention to the area where your surgery  will be performed.  7.  Thoroughly rinse your body with warm water from the neck down.  8.  DO NOT shower/wash with your normal soap after using and rinsing off  the CHG Soap.                9.  Pat yourself dry with a clean towel.            10.  Wear clean pajamas.            11.  Place clean sheets on your bed the night of your first shower and do not  sleep with pets. Day of Surgery : Do not apply any lotions/deodorants the morning of surgery.  Please wear clean clothes to the hospital/surgery center.  FAILURE TO FOLLOW THESE INSTRUCTIONS MAY RESULT IN THE CANCELLATION OF YOUR SURGERY PATIENT SIGNATURE_________________________________  NURSE SIGNATURE__________________________________  ________________________________________________________________________   Rogelia Mire  An incentive spirometer is a tool that can help keep your lungs clear and active. This tool measures how well you are filling your lungs with each breath. Taking long deep breaths may help reverse or decrease the chance of developing breathing (pulmonary) problems (especially infection) following: A long period of time when you are unable to move or be active. BEFORE THE PROCEDURE  If the spirometer includes an indicator to show your best effort, your nurse or respiratory therapist will set it to a desired goal. If possible, sit up straight or lean slightly forward. Try not to slouch. Hold the incentive spirometer in an upright position. INSTRUCTIONS FOR USE  Sit on the edge of your bed if possible, or sit up as far as you can in bed or on a chair. Hold the incentive spirometer in an upright position. Breathe out normally. Place the mouthpiece in your mouth and seal your lips tightly around  it. Breathe in slowly and as deeply as possible, raising the piston or the ball toward the  top of the column. Hold your breath for 3-5 seconds or for as long as possible. Allow the piston or ball to fall to the bottom of the column. Remove the mouthpiece from your mouth and breathe out normally. Rest for a few seconds and repeat Steps 1 through 7 at least 10 times every 1-2 hours when you are awake. Take your time and take a few normal breaths between deep breaths. The spirometer may include an indicator to show your best effort. Use the indicator as a goal to work toward during each repetition. After each set of 10 deep breaths, practice coughing to be sure your lungs are clear. If you have an incision (the cut made at the time of surgery), support your incision when coughing by placing a pillow or rolled up towels firmly against it. Once you are able to get out of bed, walk around indoors and cough well. You may stop using the incentive spirometer when instructed by your caregiver.  RISKS AND COMPLICATIONS Take your time so you do not get dizzy or light-headed. If you are in pain, you may need to take or ask for pain medication before doing incentive spirometry. It is harder to take a deep breath if you are having pain. AFTER USE Rest and breathe slowly and easily. It can be helpful to keep track of a log of your progress. Your caregiver can provide you with a simple table to help with this. If you are using the spirometer at home, follow these instructions: SEEK MEDICAL CARE IF:  You are having difficultly using the spirometer. You have trouble using the spirometer as often as instructed. Your pain medication is not giving enough relief while using the spirometer. You develop fever of 100.5 F (38.1 C) or higher. SEEK IMMEDIATE MEDICAL CARE IF:  You cough up bloody sputum that had not been present before. You develop fever of 102 F (38.9 C) or greater. You develop worsening pain at or near the incision site. MAKE SURE YOU:  Understand these instructions. Will watch your  condition. Will get help right away if you are not doing well or get worse. Document Released: 01/23/2007 Document Revised: 12/05/2011 Document Reviewed: 03/26/2007 [facility] Patient Information 2014 [place], [place].   ________________________________________________________________________

## 2021-10-07 ENCOUNTER — Encounter (HOSPITAL_COMMUNITY)
Admission: RE | Admit: 2021-10-07 | Discharge: 2021-10-07 | Disposition: A | Discharge status: Home / Self Care | Payer: Medicare HMO | Source: Ambulatory Visit | Attending: Orthopedic Surgery | Admitting: Orthopedic Surgery

## 2021-10-07 ENCOUNTER — Other Ambulatory Visit: Payer: Self-pay

## 2021-10-07 ENCOUNTER — Encounter (INDEPENDENT_AMBULATORY_CARE_PROVIDER_SITE_OTHER): Payer: Self-pay | Admitting: Family Medicine

## 2021-10-07 ENCOUNTER — Ambulatory Visit (INDEPENDENT_AMBULATORY_CARE_PROVIDER_SITE_OTHER): Payer: Medicare HMO | Admitting: Family Medicine

## 2021-10-07 ENCOUNTER — Encounter (HOSPITAL_COMMUNITY): Payer: Self-pay

## 2021-10-07 VITALS — BP 133/54 | HR 72 | Temp 98.0°F | Resp 18 | Ht 59.0 in | Wt 187.0 lb

## 2021-10-07 VITALS — BP 111/72 | HR 83 | Temp 98.2°F | Ht 59.0 in | Wt 186.0 lb

## 2021-10-07 DIAGNOSIS — Z01818 Encounter for other preprocedural examination: Secondary | ICD-10-CM | POA: Diagnosis not present

## 2021-10-07 DIAGNOSIS — Z6837 Body mass index (BMI) 37.0-37.9, adult: Secondary | ICD-10-CM

## 2021-10-07 DIAGNOSIS — M1711 Unilateral primary osteoarthritis, right knee: Secondary | ICD-10-CM | POA: Insufficient documentation

## 2021-10-07 DIAGNOSIS — R7303 Prediabetes: Secondary | ICD-10-CM

## 2021-10-07 DIAGNOSIS — I4510 Unspecified right bundle-branch block: Secondary | ICD-10-CM | POA: Insufficient documentation

## 2021-10-07 DIAGNOSIS — E7849 Other hyperlipidemia: Secondary | ICD-10-CM | POA: Diagnosis not present

## 2021-10-07 LAB — PROTIME-INR
INR: 1 (ref 0.8–1.2)
Prothrombin Time: 12.7 seconds (ref 11.4–15.2)

## 2021-10-07 LAB — CBC
HCT: 42.3 % (ref 36.0–46.0)
Hemoglobin: 13.2 g/dL (ref 12.0–15.0)
MCH: 27.6 pg (ref 26.0–34.0)
MCHC: 31.2 g/dL (ref 30.0–36.0)
MCV: 88.5 fL (ref 80.0–100.0)
Platelets: 311 10*3/uL (ref 150–400)
RBC: 4.78 MIL/uL (ref 3.87–5.11)
RDW: 13.4 % (ref 11.5–15.5)
WBC: 6.3 10*3/uL (ref 4.0–10.5)
nRBC: 0 % (ref 0.0–0.2)

## 2021-10-07 LAB — SURGICAL PCR SCREEN
MRSA, PCR: NEGATIVE
Staphylococcus aureus: NEGATIVE

## 2021-10-07 LAB — COMPREHENSIVE METABOLIC PANEL
ALT: 13 U/L (ref 0–44)
AST: 18 U/L (ref 15–41)
Albumin: 4.4 g/dL (ref 3.5–5.0)
Alkaline Phosphatase: 92 U/L (ref 38–126)
Anion gap: 6 (ref 5–15)
BUN: 15 mg/dL (ref 8–23)
CO2: 29 mmol/L (ref 22–32)
Calcium: 9.3 mg/dL (ref 8.9–10.3)
Chloride: 104 mmol/L (ref 98–111)
Creatinine, Ser: 0.81 mg/dL (ref 0.44–1.00)
GFR, Estimated: >60 mL/min (ref >60)
Glucose, Bld: 96 mg/dL (ref 70–99)
Potassium: 3.8 mmol/L (ref 3.5–5.1)
Sodium: 139 mmol/L (ref 135–145)
Total Bilirubin: 0.5 mg/dL (ref 0.3–1.2)
Total Protein: 7.6 g/dL (ref 6.5–8.1)

## 2021-10-07 LAB — GLUCOSE, CAPILLARY: Glucose-Capillary: 85 mg/dL (ref 70–99)

## 2021-10-07 MED ORDER — OZEMPIC (1 MG/DOSE) 4 MG/3ML ~~LOC~~ SOPN: 1.0000 mg | PEN_INJECTOR | SUBCUTANEOUS | 0 refills | Status: DC

## 2021-10-07 NOTE — Progress Notes (Signed)
The 10-year ASCVD risk score (Arnett DK, et al., 2019) is: 9.2%   Values used to calculate the score:     Age: 71 years     Sex: Female     Is Non-Hispanic African American: Yes     Diabetic: No     Tobacco smoker: No     Systolic Blood Pressure: 111 mmHg     Is BP treated: Yes     HDL Cholesterol: 78 mg/dL     Total Cholesterol: 236 mg/dL

## 2021-10-07 NOTE — Progress Notes (Signed)
Chief Complaint:   OBESITY Darlene [name] is here to discuss her progress with her obesity treatment plan along with follow-up of her obesity related diagnoses. [name] is on keeping a food journal and adhering to recommended goals of 95-1150 calories and 75 grams of protein and states she is following her eating plan approximately 75% of the time. [name] states she is doing stretches, walking and squats for 10 minutes 3 times per week.  Today's visit was #: 82 Starting weight: 231 lbs Starting date: 06/04/2019 Today's weight: 186 lbs Today's date: 10/07/2021 Total lbs lost to date: 45 lbs Total lbs lost since last in-office visit: +3  Interim History: [name] did not journal over the holidays but is now journaling again. She will have a total knee replacement January 23rd.  Subjective:   1. Pre-diabetes Darlene [name] notes that Ozempic is quite beneficial for appetite. She is tolerating it well. Her A1C is 5.8, up from 5.5. Her pre-diabetes is worsening. We discussed labs today.  Lab Results  Component Value Date   HGBA1C 5.8 (H) 08/25/2021   Lab Results  Component Value Date   INSULIN 8.4 08/25/2021   INSULIN 5.8 03/10/2021   INSULIN 9.6 11/03/2020   INSULIN 10.5 06/23/2020   INSULIN 12.2 04/01/2020    2. Other hyperlipidemia We changed to pravastatin from Crestor due to myalgias and her LDL has increased. ASCVD risk score is 9.2%. She is only taking pravastatin a few days per week. Her myalgias are improved.  Lab Results  Component Value Date   CHOL 236 (H) 08/25/2021   HDL 78 08/25/2021   LDLCALC 142 (H) 08/25/2021   TRIG 94 08/25/2021   CHOLHDL 2.2 03/10/2021   Lab Results  Component Value Date   ALT 8 08/25/2021   AST 14 08/25/2021   ALKPHOS 107 08/25/2021   BILITOT 0.4 08/25/2021   The 10-year ASCVD risk score (Arnett DK, et al., 2019) is: 9.2%   Values used to calculate the score:     Age: 71 years     Sex: Female     Is Non-Hispanic African American: Yes      Diabetic: No     Tobacco smoker: No     Systolic Blood Pressure: [id number] mmHg     Is BP treated: Yes     HDL Cholesterol: 78 mg/dL     Total Cholesterol: 236 mg/dL  Assessment/Plan:   1. Pre-diabetes We will refill Ozempic 1 mg weekly.- Semaglutide, 1 MG/DOSE, (OZEMPIC, 1 MG/DOSE,) 4 MG/3ML SOPN; Inject 1 mg into the skin once a week.  Dispense: 3 mL; Refill: 0  2. Other hyperlipidemia Cardiovascular risk and specific lipid/LDL goals reviewed.  Kathiria will try to take pravastatin daily after her knee surgery. She will increase fiber and decreased saturated fats. She will increase exercise after recovering from total knee replacement.   3. Obesity: Current BMI 37.55 Darlene [name] is currently in the action stage of change. As such, her goal is to continue with weight loss efforts. She has agreed to keeping a food journal and adhering to recommended goals of [phone] calories and 80 grams of protein daily.  Exercise goals:  As is.  Behavioral modification strategies: keeping a strict food journal.  Darlene [name] has agreed to follow-up with our clinic in 4-5 weeks (may do virtual if needed).   Objective:   Blood pressure 111/72, pulse 83, temperature 98.2 F (36.8 C), height 4\' 11"  (1.499 m), weight 186 lb (84.4 kg), SpO2 100 %. Body mass index is  37.57 kg/m.  General: Cooperative, alert, well developed, in no acute distress. HEENT: Conjunctivae and lids unremarkable. Cardiovascular: Regular rhythm.  Lungs: Normal work of breathing. Neurologic: No focal deficits.   Lab Results  Component Value Date   CREATININE 0.75 08/25/2021   BUN 15 08/25/2021   NA 138 08/25/2021   K 4.3 08/25/2021   CL 99 08/25/2021   CO2 25 08/25/2021   Lab Results  Component Value Date   ALT 8 08/25/2021   AST 14 08/25/2021   ALKPHOS 107 08/25/2021   BILITOT 0.4 08/25/2021   Lab Results  Component Value Date   HGBA1C 5.8 (H) 08/25/2021   HGBA1C 5.5 03/10/2021   HGBA1C 5.8 (H) 11/03/2020   HGBA1C 5.9 (H)  06/23/2020   HGBA1C 5.6 04/01/2020   Lab Results  Component Value Date   INSULIN 8.4 08/25/2021   INSULIN 5.8 03/10/2021   INSULIN 9.6 11/03/2020   INSULIN 10.5 06/23/2020   INSULIN 12.2 04/01/2020   Lab Results  Component Value Date   TSH 1.620 06/04/2019   Lab Results  Component Value Date   CHOL 236 (H) 08/25/2021   HDL 78 08/25/2021   LDLCALC 142 (H) 08/25/2021   TRIG 94 08/25/2021   CHOLHDL 2.2 03/10/2021   Lab Results  Component Value Date   VD25OH 77.4 08/25/2021   VD25OH 46.2 04/21/2021   VD25OH 46.0 11/03/2020   Lab Results  Component Value Date   WBC 5.2 08/25/2021   HGB 13.1 08/25/2021   HCT 41.2 08/25/2021   MCV 85 08/25/2021   PLT 333 08/25/2021   No results found for: IRON, TIBC, FERRITIN  Attestation Statements:   Reviewed by clinician on day of visit: allergies, medications, problem list, medical history, surgical history, family history, social history, and previous encounter notes.  I, Lizbeth Bark, RMA, am acting as Location manager for [employer], [place].  I have reviewed the above documentation for accuracy and completeness, and I agree with the above. -  Georgianne Fick, FNP

## 2021-10-07 NOTE — Progress Notes (Signed)
COVID test- 10/14/21 at 9:45 am   PCP - Dr. Lind Covert Cardiologist - none  Chest x-ray - no EKG - 10/07/21-chart Stress Test - no ECHO - no Cardiac Cath - NA Pacemaker/ICD device last checked:NA  Sleep Study - no CPAP -   Fasting Blood Sugar - Pt is pre-diabetic and takes Ozempic for weight loss Checks Blood Sugar _____ times a day  Blood Thinner Instructions:no Aspirin Instructions: Last Dose:  Anesthesia review: yes  Patient denies shortness of breath, fever, cough and chest pain at PAT appointment Pt reports no SOB climbing stairs, doing housework or with ADLs.  Patient verbalized understanding of instructions that were given to them at the PAT appointment. Patient was also instructed that they will need to review over the PAT instructions again at home before surgery. yes

## 2021-10-14 ENCOUNTER — Encounter (HOSPITAL_COMMUNITY)
Admission: RE | Admit: 2021-10-14 | Discharge: 2021-10-14 | Disposition: A | Discharge status: Home / Self Care | Payer: Medicare HMO | Source: Ambulatory Visit | Attending: Orthopedic Surgery | Admitting: Orthopedic Surgery

## 2021-10-14 ENCOUNTER — Other Ambulatory Visit: Payer: Self-pay

## 2021-10-14 DIAGNOSIS — Z01812 Encounter for preprocedural laboratory examination: Secondary | ICD-10-CM | POA: Diagnosis not present

## 2021-10-14 DIAGNOSIS — Z20822 Contact with and (suspected) exposure to covid-19: Secondary | ICD-10-CM | POA: Insufficient documentation

## 2021-10-14 DIAGNOSIS — Z01818 Encounter for other preprocedural examination: Secondary | ICD-10-CM

## 2021-10-14 LAB — SARS CORONAVIRUS 2 (TAT 6-24 HRS): SARS Coronavirus 2: NEGATIVE

## 2021-10-17 NOTE — Anesthesia Preprocedure Evaluation (Addendum)
Anesthesia Evaluation  Patient identified by MRN, date of birth, ID band Patient awake    Reviewed: Allergy & Precautions, NPO status , Patient's Chart, lab work & pertinent test results  History of Anesthesia Complications Negative for: history of anesthetic complications  Airway Mallampati: II  TM Distance: >3 FB Neck ROM: Full    Dental  (+) Missing,    Pulmonary neg pulmonary ROS,    Pulmonary exam normal        Cardiovascular hypertension, Pt. on medications Normal cardiovascular exam     Neuro/Psych Anxiety Depression negative neurological ROS     GI/Hepatic negative GI ROS, Neg liver ROS,   Endo/Other  negative endocrine ROS  Renal/GU negative Renal ROS  negative genitourinary   Musculoskeletal  (+) Arthritis ,   Abdominal   Peds  Hematology negative hematology ROS (+)   Anesthesia Other Findings Day of surgery medications reviewed with patient.  Reproductive/Obstetrics negative OB ROS                            Anesthesia Physical Anesthesia Plan  ASA: 2  Anesthesia Plan: Spinal   Post-op Pain Management: Regional block and Ofirmev IV (intra-op)   Induction:   PONV Risk Score and Plan: 3 and Treatment may vary due to age or medical condition, Ondansetron, Propofol infusion and Dexamethasone  Airway Management Planned: Natural Airway and Simple Face Mask  Additional Equipment: None  Intra-op Plan:   Post-operative Plan:   Informed Consent: I have reviewed the patients History and Physical, chart, labs and discussed the procedure including the risks, benefits and alternatives for the proposed anesthesia with the patient or authorized representative who has indicated his/her understanding and acceptance.       Plan Discussed with: CRNA  Anesthesia Plan Comments:        Anesthesia Quick Evaluation

## 2021-10-18 ENCOUNTER — Encounter (HOSPITAL_COMMUNITY)
Admission: RE | Disposition: A | Discharge status: Home / Self Care | Payer: Self-pay | Source: Home / Self Care | Attending: Orthopedic Surgery

## 2021-10-18 ENCOUNTER — Observation Stay (HOSPITAL_COMMUNITY)
Admission: RE | Admit: 2021-10-18 | Discharge: 2021-10-21 | Disposition: A | Discharge status: Home / Self Care | Payer: Medicare HMO | Attending: Orthopedic Surgery | Admitting: Orthopedic Surgery

## 2021-10-18 ENCOUNTER — Ambulatory Visit (HOSPITAL_COMMUNITY): Payer: Medicare HMO | Admitting: Anesthesiology

## 2021-10-18 ENCOUNTER — Ambulatory Visit (HOSPITAL_COMMUNITY): Payer: Medicare HMO | Admitting: Physician Assistant

## 2021-10-18 ENCOUNTER — Other Ambulatory Visit: Payer: Self-pay

## 2021-10-18 ENCOUNTER — Encounter (HOSPITAL_COMMUNITY): Payer: Self-pay | Admitting: Orthopedic Surgery

## 2021-10-18 DIAGNOSIS — Z96652 Presence of left artificial knee joint: Secondary | ICD-10-CM | POA: Diagnosis not present

## 2021-10-18 DIAGNOSIS — R7303 Prediabetes: Secondary | ICD-10-CM | POA: Diagnosis not present

## 2021-10-18 DIAGNOSIS — G8918 Other acute postprocedural pain: Secondary | ICD-10-CM | POA: Diagnosis not present

## 2021-10-18 DIAGNOSIS — M1711 Unilateral primary osteoarthritis, right knee: Secondary | ICD-10-CM | POA: Diagnosis not present

## 2021-10-18 DIAGNOSIS — I10 Essential (primary) hypertension: Secondary | ICD-10-CM | POA: Diagnosis not present

## 2021-10-18 HISTORY — PX: TOTAL KNEE ARTHROPLASTY: SHX125

## 2021-10-18 SURGERY — ARTHROPLASTY, KNEE, TOTAL
Anesthesia: Spinal | Site: Knee | Laterality: Right

## 2021-10-18 MED ORDER — ORAL CARE MOUTH RINSE: 15.0000 mL | Freq: Once | OROMUCOSAL | Status: DC

## 2021-10-18 MED ORDER — BUPIVACAINE LIPOSOME 1.3 % IJ SUSP
INTRAMUSCULAR | Status: DC | PRN
  Administered 2021-10-18: 20 mL

## 2021-10-18 MED ORDER — ONDANSETRON HCL 4 MG/2ML IJ SOLN
INTRAMUSCULAR | Status: DC | PRN
  Administered 2021-10-18: 4 mg via INTRAVENOUS

## 2021-10-18 MED ORDER — FENTANYL CITRATE PF 50 MCG/ML IJ SOSY
25.0000 ug | PREFILLED_SYRINGE | INTRAMUSCULAR | Status: DC | PRN
  Administered 2021-10-18: 50 ug via INTRAVENOUS

## 2021-10-18 MED ORDER — PHENYLEPHRINE HCL-NACL 20-0.9 MG/250ML-% IV SOLN
INTRAVENOUS | Status: DC | PRN
  Administered 2021-10-18: 40 ug/min via INTRAVENOUS

## 2021-10-18 MED ORDER — BUPIVACAINE IN DEXTROSE 0.75-8.25 % IT SOLN
INTRATHECAL | Status: DC | PRN
  Administered 2021-10-18: 1.4 mL via INTRATHECAL

## 2021-10-18 MED ORDER — BISACODYL 10 MG RE SUPP: 10.0000 mg | Freq: Every day | RECTAL | Status: DC | PRN

## 2021-10-18 MED ORDER — LACTATED RINGERS IV SOLN: INTRAVENOUS | Status: DC

## 2021-10-18 MED ORDER — PHENYLEPHRINE 40 MCG/ML (10ML) SYRINGE FOR IV PUSH (FOR BLOOD PRESSURE SUPPORT)
PREFILLED_SYRINGE | INTRAVENOUS | Status: DC | PRN
  Administered 2021-10-18: 80 ug via INTRAVENOUS

## 2021-10-18 MED ORDER — OXYCODONE HCL 5 MG PO TABS: 5.0000 mg | ORAL_TABLET | Freq: Once | ORAL | Status: DC | PRN

## 2021-10-18 MED ORDER — DOCUSATE SODIUM 100 MG PO CAPS
100.0000 mg | ORAL_CAPSULE | Freq: Two times a day (BID) | ORAL | Status: DC
  Administered 2021-10-18 – 2021-10-21 (×6): 100 mg via ORAL
  Filled 2021-10-18 (×6): qty 1

## 2021-10-18 MED ORDER — POLYETHYLENE GLYCOL 3350 17 G PO PACK: 17.0000 g | PACK | Freq: Every day | ORAL | Status: DC | PRN

## 2021-10-18 MED ORDER — CEFAZOLIN SODIUM-DEXTROSE 2-4 GM/100ML-% IV SOLN
2.0000 g | Freq: Four times a day (QID) | INTRAVENOUS | Status: AC
  Administered 2021-10-18 (×2): 2 g via INTRAVENOUS
  Filled 2021-10-18 (×2): qty 100

## 2021-10-18 MED ORDER — ASPIRIN EC 325 MG PO TBEC
325.0000 mg | DELAYED_RELEASE_TABLET | Freq: Two times a day (BID) | ORAL | Status: DC
  Administered 2021-10-19 – 2021-10-21 (×5): 325 mg via ORAL
  Filled 2021-10-18 (×5): qty 1

## 2021-10-18 MED ORDER — CHLORHEXIDINE GLUCONATE 0.12 % MT SOLN: 15.0000 mL | Freq: Once | OROMUCOSAL | Status: DC

## 2021-10-18 MED ORDER — 0.9 % SODIUM CHLORIDE (POUR BTL) OPTIME
TOPICAL | Status: DC | PRN
  Administered 2021-10-18: 1000 mL

## 2021-10-18 MED ORDER — METOCLOPRAMIDE HCL 5 MG/ML IJ SOLN: 5.0000 mg | Freq: Three times a day (TID) | INTRAMUSCULAR | Status: DC | PRN

## 2021-10-18 MED ORDER — ACETAMINOPHEN 500 MG PO TABS: 500.0000 mg | ORAL_TABLET | Freq: Four times a day (QID) | ORAL | Status: DC | PRN

## 2021-10-18 MED ORDER — FENTANYL CITRATE PF 50 MCG/ML IJ SOSY
50.0000 ug | PREFILLED_SYRINGE | INTRAMUSCULAR | Status: DC
  Administered 2021-10-18: 50 ug via INTRAVENOUS
  Filled 2021-10-18: qty 2

## 2021-10-18 MED ORDER — HYDROCODONE-ACETAMINOPHEN 5-325 MG PO TABS: 1.0000 | ORAL_TABLET | ORAL | Status: DC | PRN

## 2021-10-18 MED ORDER — METHOCARBAMOL 500 MG IVPB - SIMPLE MED
INTRAVENOUS | Status: AC
  Administered 2021-10-18: 500 mg
  Filled 2021-10-18: qty 50

## 2021-10-18 MED ORDER — METOCLOPRAMIDE HCL 5 MG PO TABS: 5.0000 mg | ORAL_TABLET | Freq: Three times a day (TID) | ORAL | Status: DC | PRN

## 2021-10-18 MED ORDER — PHENOL 1.4 % MT LIQD: 1.0000 | OROMUCOSAL | Status: DC | PRN

## 2021-10-18 MED ORDER — DEXAMETHASONE SODIUM PHOSPHATE 10 MG/ML IJ SOLN
8.0000 mg | Freq: Once | INTRAMUSCULAR | Status: AC
  Administered 2021-10-18: 8 mg via INTRAVENOUS

## 2021-10-18 MED ORDER — FLEET ENEMA 7-19 GM/118ML RE ENEM: 1.0000 | ENEMA | Freq: Once | RECTAL | Status: DC | PRN

## 2021-10-18 MED ORDER — ACETAMINOPHEN 10 MG/ML IV SOLN
1000.0000 mg | Freq: Four times a day (QID) | INTRAVENOUS | Status: DC
  Administered 2021-10-18: 1000 mg via INTRAVENOUS
  Filled 2021-10-18: qty 100

## 2021-10-18 MED ORDER — PROPOFOL 10 MG/ML IV BOLUS
INTRAVENOUS | Status: DC | PRN
  Administered 2021-10-18: 30 mg via INTRAVENOUS
  Administered 2021-10-18: 20 mg via INTRAVENOUS

## 2021-10-18 MED ORDER — HYDROCHLOROTHIAZIDE 12.5 MG PO TABS
12.5000 mg | ORAL_TABLET | Freq: Every morning | ORAL | Status: DC
  Administered 2021-10-20 – 2021-10-21 (×2): 12.5 mg via ORAL
  Filled 2021-10-18 (×3): qty 1

## 2021-10-18 MED ORDER — PRAVASTATIN SODIUM 20 MG PO TABS
10.0000 mg | ORAL_TABLET | Freq: Every evening | ORAL | Status: DC
Start: 2021-10-19 — End: 2021-10-21
  Administered 2021-10-19 – 2021-10-20 (×2): 10 mg via ORAL
  Filled 2021-10-18 (×2): qty 1

## 2021-10-18 MED ORDER — GABAPENTIN 300 MG PO CAPS
300.0000 mg | ORAL_CAPSULE | Freq: Three times a day (TID) | ORAL | Status: DC
  Filled 2021-10-18 (×2): qty 1

## 2021-10-18 MED ORDER — MIDAZOLAM HCL 2 MG/2ML IJ SOLN
1.0000 mg | INTRAMUSCULAR | Status: DC
  Administered 2021-10-18: 1 mg via INTRAVENOUS
  Filled 2021-10-18: qty 2

## 2021-10-18 MED ORDER — BUPIVACAINE-EPINEPHRINE (PF) 0.5% -1:200000 IJ SOLN
INTRAMUSCULAR | Status: DC | PRN
  Administered 2021-10-18: 15 mL via PERINEURAL

## 2021-10-18 MED ORDER — METHOCARBAMOL 500 MG PO TABS
500.0000 mg | ORAL_TABLET | Freq: Four times a day (QID) | ORAL | Status: DC | PRN
  Administered 2021-10-18 – 2021-10-21 (×8): 500 mg via ORAL
  Filled 2021-10-18 (×9): qty 1

## 2021-10-18 MED ORDER — DIPHENHYDRAMINE HCL 12.5 MG/5ML PO ELIX: 12.5000 mg | ORAL_SOLUTION | ORAL | Status: DC | PRN

## 2021-10-18 MED ORDER — TRANEXAMIC ACID-NACL 1000-0.7 MG/100ML-% IV SOLN
1000.0000 mg | INTRAVENOUS | Status: AC
  Administered 2021-10-18: 1000 mg via INTRAVENOUS
  Filled 2021-10-18: qty 100

## 2021-10-18 MED ORDER — SODIUM CHLORIDE 0.9 % IR SOLN
Status: DC | PRN
  Administered 2021-10-18: 1000 mL

## 2021-10-18 MED ORDER — ONDANSETRON HCL 4 MG PO TABS
4.0000 mg | ORAL_TABLET | Freq: Four times a day (QID) | ORAL | Status: DC | PRN
  Administered 2021-10-18: 4 mg via ORAL
  Filled 2021-10-18: qty 1

## 2021-10-18 MED ORDER — ONDANSETRON HCL 4 MG/2ML IJ SOLN: 4.0000 mg | Freq: Four times a day (QID) | INTRAMUSCULAR | Status: DC | PRN

## 2021-10-18 MED ORDER — BUPIVACAINE LIPOSOME 1.3 % IJ SUSP
INTRAMUSCULAR | Status: AC
  Filled 2021-10-18: qty 20

## 2021-10-18 MED ORDER — POVIDONE-IODINE 10 % EX SWAB
2.0000 | Freq: Once | CUTANEOUS | Status: DC
Start: 2021-10-18 — End: 2021-10-18

## 2021-10-18 MED ORDER — TRAMADOL HCL 50 MG PO TABS
ORAL_TABLET | ORAL | Status: AC
  Filled 2021-10-18: qty 1

## 2021-10-18 MED ORDER — ACETAMINOPHEN 500 MG PO TABS
1000.0000 mg | ORAL_TABLET | Freq: Four times a day (QID) | ORAL | Status: DC
  Administered 2021-10-18: 1000 mg via ORAL
  Filled 2021-10-18: qty 2

## 2021-10-18 MED ORDER — METHOCARBAMOL 500 MG IVPB - SIMPLE MED
500.0000 mg | Freq: Four times a day (QID) | INTRAVENOUS | Status: DC | PRN
  Filled 2021-10-18: qty 50

## 2021-10-18 MED ORDER — DEXAMETHASONE SODIUM PHOSPHATE 10 MG/ML IJ SOLN
10.0000 mg | Freq: Once | INTRAMUSCULAR | Status: AC
  Administered 2021-10-19: 09:00:00 10 mg via INTRAVENOUS
  Filled 2021-10-18: qty 1

## 2021-10-18 MED ORDER — POTASSIUM CHLORIDE ER 10 MEQ PO TBCR
10.0000 meq | EXTENDED_RELEASE_TABLET | Freq: Every evening | ORAL | Status: DC
  Administered 2021-10-18 – 2021-10-20 (×3): 10 meq via ORAL
  Filled 2021-10-18 (×7): qty 1

## 2021-10-18 MED ORDER — OXYCODONE HCL 5 MG/5ML PO SOLN
5.0000 mg | Freq: Once | ORAL | Status: DC | PRN
Start: 2021-10-18 — End: 2021-10-18

## 2021-10-18 MED ORDER — FENTANYL CITRATE PF 50 MCG/ML IJ SOSY
PREFILLED_SYRINGE | INTRAMUSCULAR | Status: AC
  Filled 2021-10-18: qty 1

## 2021-10-18 MED ORDER — BUPIVACAINE LIPOSOME 1.3 % IJ SUSP: 20.0000 mL | Freq: Once | INTRAMUSCULAR | Status: DC

## 2021-10-18 MED ORDER — PHENYLEPHRINE 40 MCG/ML (10ML) SYRINGE FOR IV PUSH (FOR BLOOD PRESSURE SUPPORT)
PREFILLED_SYRINGE | INTRAVENOUS | Status: AC
  Filled 2021-10-18: qty 10

## 2021-10-18 MED ORDER — STERILE WATER FOR IRRIGATION IR SOLN
Status: DC | PRN
  Administered 2021-10-18: 2000 mL

## 2021-10-18 MED ORDER — MORPHINE SULFATE (PF) 2 MG/ML IV SOLN
0.5000 mg | INTRAVENOUS | Status: DC | PRN
  Administered 2021-10-18: 1 mg via INTRAVENOUS
  Filled 2021-10-18 (×2): qty 1

## 2021-10-18 MED ORDER — MENTHOL 3 MG MT LOZG: 1.0000 | LOZENGE | OROMUCOSAL | Status: DC | PRN

## 2021-10-18 MED ORDER — PROPOFOL 500 MG/50ML IV EMUL
INTRAVENOUS | Status: DC | PRN
Start: 2021-10-18 — End: 2021-10-18
  Administered 2021-10-18: 75 ug/kg/min via INTRAVENOUS

## 2021-10-18 MED ORDER — HYDROCODONE-ACETAMINOPHEN 5-325 MG PO TABS
ORAL_TABLET | ORAL | Status: AC
  Filled 2021-10-18: qty 1

## 2021-10-18 MED ORDER — HYDROCODONE-ACETAMINOPHEN 5-325 MG PO TABS
1.0000 | ORAL_TABLET | ORAL | Status: DC | PRN
  Administered 2021-10-18: 2 via ORAL
  Administered 2021-10-19: 16:00:00 1 via ORAL
  Administered 2021-10-19: 23:00:00 2 via ORAL
  Administered 2021-10-20 (×2): 1 via ORAL
  Filled 2021-10-18 (×2): qty 1
  Filled 2021-10-18 (×2): qty 2
  Filled 2021-10-18: qty 1

## 2021-10-18 MED ORDER — CLONIDINE HCL (ANALGESIA) 100 MCG/ML EP SOLN
EPIDURAL | Status: DC | PRN
Start: 2021-10-18 — End: 2021-10-18
  Administered 2021-10-18: 50 ug

## 2021-10-18 MED ORDER — SODIUM CHLORIDE 0.9 % IV SOLN: INTRAVENOUS | Status: DC

## 2021-10-18 MED ORDER — TRAMADOL HCL 50 MG PO TABS
50.0000 mg | ORAL_TABLET | Freq: Four times a day (QID) | ORAL | Status: DC | PRN
  Administered 2021-10-18 (×2): 50 mg via ORAL
  Administered 2021-10-19 – 2021-10-21 (×7): 100 mg via ORAL
  Filled 2021-10-18: qty 1
  Filled 2021-10-18: qty 2
  Filled 2021-10-18: qty 1
  Filled 2021-10-18 (×7): qty 2

## 2021-10-18 MED ORDER — SODIUM CHLORIDE (PF) 0.9 % IJ SOLN
INTRAMUSCULAR | Status: AC
  Filled 2021-10-18: qty 10

## 2021-10-18 MED ORDER — SODIUM CHLORIDE (PF) 0.9 % IJ SOLN
INTRAMUSCULAR | Status: DC | PRN
  Administered 2021-10-18: 60 mL

## 2021-10-18 MED ORDER — CEFAZOLIN SODIUM-DEXTROSE 2-4 GM/100ML-% IV SOLN
2.0000 g | INTRAVENOUS | Status: AC
  Administered 2021-10-18: 2 g via INTRAVENOUS
  Filled 2021-10-18: qty 100

## 2021-10-18 SURGICAL SUPPLY — 50 items
ATTUNE PS FEM RT SZ 5 CEM KNEE (Femur) ×1 IMPLANT
ATTUNE PSRP INSR SZ 5 10M KNEE (Insert) ×1 IMPLANT
BAG COUNTER SPONGE SURGICOUNT (BAG) IMPLANT
BAG ZIPLOCK 12X15 (MISCELLANEOUS) ×2 IMPLANT
BASEPLATE TIBIAL ROTATING SZ 4 (Knees) ×1 IMPLANT
BLADE SAG 18X100X1.27 (BLADE) ×2 IMPLANT
BLADE SAW SGTL 11.0X1.19X90.0M (BLADE) ×2 IMPLANT
BNDG ELASTIC 6X5.8 VLCR STR LF (GAUZE/BANDAGES/DRESSINGS) ×2 IMPLANT
BOWL SMART MIX CTS (DISPOSABLE) ×2 IMPLANT
CEMENT HV SMART SET (Cement) ×4 IMPLANT
COVER SURGICAL LIGHT HANDLE (MISCELLANEOUS) ×2 IMPLANT
CUFF TOURN SGL QUICK 34 (TOURNIQUET CUFF) ×2
CUFF TRNQT CYL 34X4.125X (TOURNIQUET CUFF) ×1 IMPLANT
DECANTER SPIKE VIAL GLASS SM (MISCELLANEOUS) ×2 IMPLANT
DRAPE INCISE IOBAN 66X45 STRL (DRAPES) ×2 IMPLANT
DRAPE U-SHAPE 47X51 STRL (DRAPES) ×2 IMPLANT
DRSG AQUACEL AG ADV 3.5X10 (GAUZE/BANDAGES/DRESSINGS) ×2 IMPLANT
DURAPREP 26ML APPLICATOR (WOUND CARE) ×2 IMPLANT
ELECT REM PT RETURN 15FT ADLT (MISCELLANEOUS) ×2 IMPLANT
GLOVE SRG 8 PF TXTR STRL LF DI (GLOVE) ×1 IMPLANT
GLOVE SURG ENC MOIS LTX SZ6.5 (GLOVE) ×2 IMPLANT
GLOVE SURG ENC MOIS LTX SZ8 (GLOVE) ×4 IMPLANT
GLOVE SURG UNDER POLY LF SZ7 (GLOVE) ×2 IMPLANT
GLOVE SURG UNDER POLY LF SZ8 (GLOVE) ×2
GLOVE SURG UNDER POLY LF SZ8.5 (GLOVE) ×2 IMPLANT
GOWN STRL REUS W/TWL LRG LVL3 (GOWN DISPOSABLE) ×4 IMPLANT
GOWN STRL REUS W/TWL XL LVL3 (GOWN DISPOSABLE) ×2 IMPLANT
HANDPIECE INTERPULSE COAX TIP (DISPOSABLE) ×2
HOLDER FOLEY CATH W/STRAP (MISCELLANEOUS) IMPLANT
IMMOBILIZER KNEE 20 (SOFTGOODS) ×2
IMMOBILIZER KNEE 20 THIGH 36 (SOFTGOODS) ×1 IMPLANT
KIT TURNOVER KIT A (KITS) IMPLANT
MANIFOLD NEPTUNE II (INSTRUMENTS) ×2 IMPLANT
NS IRRIG 1000ML POUR BTL (IV SOLUTION) ×2 IMPLANT
PACK TOTAL KNEE CUSTOM (KITS) ×2 IMPLANT
PADDING CAST COTTON 6X4 STRL (CAST SUPPLIES) ×3 IMPLANT
PATELLA MEDIAL ATTUN 35MM KNEE (Knees) ×1 IMPLANT
PROTECTOR NERVE ULNAR (MISCELLANEOUS) ×2 IMPLANT
SET HNDPC FAN SPRY TIP SCT (DISPOSABLE) ×1 IMPLANT
SPONGE T-LAP 18X18 ~~LOC~~+RFID (SPONGE) ×6 IMPLANT
STRIP CLOSURE SKIN 1/2X4 (GAUZE/BANDAGES/DRESSINGS) ×3 IMPLANT
SUT MNCRL AB 4-0 PS2 18 (SUTURE) ×2 IMPLANT
SUT STRATAFIX 0 PDS 27 VIOLET (SUTURE) ×2
SUT VIC AB 2-0 CT1 27 (SUTURE) ×6
SUT VIC AB 2-0 CT1 TAPERPNT 27 (SUTURE) ×3 IMPLANT
SUTURE STRATFX 0 PDS 27 VIOLET (SUTURE) ×1 IMPLANT
TRAY FOLEY MTR SLVR 16FR STAT (SET/KITS/TRAYS/PACK) ×2 IMPLANT
TUBE SUCTION HIGH CAP CLEAR NV (SUCTIONS) ×2 IMPLANT
WATER STERILE IRR 1000ML POUR (IV SOLUTION) ×4 IMPLANT
WRAP KNEE MAXI GEL POST OP (GAUZE/BANDAGES/DRESSINGS) ×2 IMPLANT

## 2021-10-18 NOTE — Transfer of Care (Signed)
Immediate Anesthesia Transfer of Care Note  Patient: Darlene [name]  Procedure(s) Performed: TOTAL KNEE ARTHROPLASTY (Right: Knee)  Patient Location: PACU  Anesthesia Type:General  Level of Consciousness: awake  Airway & Oxygen Therapy: Patient Spontanous Breathing and Patient connected to face mask oxygen  Post-op Assessment: Report given to RN and Post -op Vital signs reviewed and stable  Post vital signs: Reviewed and stable  Last Vitals:  Vitals Value Taken Time  BP 93/61 10/18/21 1200  Temp    Pulse 92 10/18/21 1202  Resp 20 10/18/21 1202  SpO2 100 % 10/18/21 1202  Vitals shown include unvalidated device data.  Last Pain:  Vitals:   10/18/21 0957  TempSrc:   PainSc: 0-No pain      Patients Stated Pain Goal: 5 (10/18/21 0831)  Complications: No notable events documented.

## 2021-10-18 NOTE — Anesthesia Procedure Notes (Signed)
Spinal  Patient location during procedure: OR Start time: 10/18/2021 10:19 AM End time: 10/18/2021 10:22 AM Reason for block: surgical anesthesia Staffing Performed: anesthesiologist  Anesthesiologist: Kaylyn Layer, MD Preanesthetic Checklist Completed: patient identified, IV checked, risks and benefits discussed, surgical consent, monitors and equipment checked, pre-op evaluation and timeout performed Spinal Block Patient position: sitting Prep: DuraPrep and site prepped and draped Patient monitoring: continuous pulse ox, blood pressure and heart rate Approach: midline Location: L3-4 Injection technique: single-shot Needle Needle type: Pencan  Needle gauge: 24 G Needle length: 9 cm Assessment Events: CSF return Additional Notes Risks, benefits, and alternative discussed. Patient gave consent to procedure. Prepped and draped in sitting position. Patient sedated but responsive to voice. Clear CSF obtained after one needle redirection. Positive terminal aspiration. No pain or paraesthesias with injection. Patient tolerated procedure well. Vital signs stable. Amalia Greenhouse, MD

## 2021-10-18 NOTE — Plan of Care (Signed)

## 2021-10-18 NOTE — Anesthesia Postprocedure Evaluation (Signed)
Anesthesia Post Note  Patient: Darlene [name]  Procedure(s) Performed: TOTAL KNEE ARTHROPLASTY (Right: Knee)     Patient location during evaluation: PACU Anesthesia Type: Spinal Level of consciousness: awake and alert and oriented Pain management: pain level controlled Vital Signs Assessment: post-procedure vital signs reviewed and stable Respiratory status: spontaneous breathing, nonlabored ventilation and respiratory function stable Cardiovascular status: blood pressure returned to baseline Postop Assessment: no apparent nausea or vomiting, spinal receding, no headache and no backache Anesthetic complications: no   No notable events documented.  Last Vitals:  Vitals:   10/18/21 1445 10/18/21 1500  BP: 119/75 122/68  Pulse: 61 60  Resp: 13 15  Temp:    SpO2: 100% 100%    Last Pain:  Vitals:   10/18/21 1500  TempSrc:   PainSc: 5                  [name]

## 2021-10-18 NOTE — Progress Notes (Signed)
AssistedDr. Howse with right, ultrasound guided, adductor canal block. Side rails up, monitors on throughout procedure. See vital signs in flow sheet. Tolerated Procedure well.  

## 2021-10-18 NOTE — Op Note (Signed)
OPERATIVE REPORT-TOTAL KNEE ARTHROPLASTY   Pre-operative diagnosis- Osteoarthritis  Right knee(s)  Post-operative diagnosis- Osteoarthritis Right knee(s)  Procedure-  Right  Total Knee Arthroplasty  Surgeon- Darlene [name]. Darlene Callanan, MD  Assistant- Darlene Duty, PA-C   Anesthesia-   Adductor canal block and spinal  EBL-50 mL   Drains None  Tourniquet time-  Total Tourniquet Time Documented: Thigh (Right) - 40 minutes Total: Thigh (Right) - 40 minutes     Complications- None  Condition-PACU - hemodynamically stable.   Brief Clinical Note  Darlene [name] is a 71 y.o. year old female with end stage OA of her right knee with progressively worsening pain and dysfunction. She has constant pain, with activity and at rest and significant functional deficits with difficulties even with ADLs. She has had extensive non-op management including analgesics, injections of cortisone and viscosupplements, and home exercise program, but remains in significant pain with significant dysfunction.Radiographs show bone on bone arthritis medial and patellofemoral. She presents now for right Total Knee Arthroplasty.     Procedure in detail---   The patient is brought into the operating room and positioned supine on the operating table. After successful administration of  Adductor canal block and spinal,   a tourniquet is placed high on the  Right thigh(s) and the lower extremity is prepped and draped in the usual sterile fashion. Time out is performed by the operating team and then the  Right lower extremity is wrapped in Esmarch, knee flexed and the tourniquet inflated to 300 mmHg.       A midline incision is made with a ten blade through the subcutaneous tissue to the level of the extensor mechanism. A fresh blade is used to make a medial parapatellar arthrotomy. Soft tissue over the proximal medial tibia is subperiosteally elevated to the joint line with a knife and into the semimembranosus bursa  with a Cobb elevator. Soft tissue over the proximal lateral tibia is elevated with attention being paid to avoiding the patellar tendon on the tibial tubercle. The patella is everted, knee flexed 90 degrees and the ACL and PCL are removed. Findings are bone on bone medial and patellofemoral with large global osteophytes        The drill is used to create a starting hole in the distal femur and the canal is thoroughly irrigated with sterile saline to remove the fatty contents. The 5 degree Right  valgus alignment guide is placed into the femoral canal and the distal femoral cutting block is pinned to remove 9 mm off the distal femur. Resection is made with an oscillating saw.      The tibia is subluxed forward and the menisci are removed. The extramedullary alignment guide is placed referencing proximally at the medial aspect of the tibial tubercle and distally along the second metatarsal axis and tibial crest. The block is pinned to remove 23mm off the more deficient medial  side. Resection is made with an oscillating saw. Size 4is the most appropriate size for the tibia and the proximal tibia is prepared with the modular drill and keel punch for that size.      The femoral sizing guide is placed and size 5 is most appropriate. Rotation is marked off the epicondylar axis and confirmed by creating a rectangular flexion gap at 90 degrees. The size 5 cutting block is pinned in this rotation and the anterior, posterior and chamfer cuts are made with the oscillating saw. The intercondylar block is then placed and that cut is made.  Trial size 4 tibial component, trial size 5 posterior stabilized femur and a 10  mm posterior stabilized rotating platform insert trial is placed. Full extension is achieved with excellent varus/valgus and anterior/posterior balance throughout full range of motion. The patella is everted and thickness measured to be 21  mm. Free hand resection is taken to 12 mm, a 35 template is placed,  lug holes are drilled, trial patella is placed, and it tracks normally. Osteophytes are removed off the posterior femur with the trial in place. All trials are removed and the cut bone surfaces prepared with pulsatile lavage. Cement is mixed and once ready for implantation, the size 4 tibial implant, size  5 posterior stabilized femoral component, and the size 35 patella are cemented in place and the patella is held with the clamp. The trial insert is placed and the knee held in full extension. The Exparel (20 ml mixed with 60 ml saline) is injected into the extensor mechanism, posterior capsule, medial and lateral gutters and subcutaneous tissues.  All extruded cement is removed and once the cement is hard the permanent 10 mm posterior stabilized rotating platform insert is placed into the tibial tray.      The wound is copiously irrigated with saline solution and the extensor mechanism closed with # 0 Stratofix suture. The tourniquet is released for a total tourniquet time of 40  minutes. Flexion against gravity is 130 degrees and the patella tracks normally. Subcutaneous tissue is closed with 2.0 vicryl and subcuticular with running 4.0 Monocryl. The incision is cleaned and dried and steri-strips and a bulky sterile dressing are applied. The limb is placed into a knee immobilizer and the patient is awakened and transported to recovery in stable condition.      Please note that a surgical assistant was a medical necessity for this procedure in order to perform it in a safe and expeditious manner. Surgical assistant was necessary to retract the ligaments and vital neurovascular structures to prevent injury to them and also necessary for proper positioning of the limb to allow for anatomic placement of the prosthesis.   Darlene [name] Darlene Starkey, MD    10/18/2021, 11:39 AM

## 2021-10-18 NOTE — Care Plan (Signed)
Ortho Bundle Case Management Note  Patient Details  Name: Darlene [name] MRN: [id number] Date of Birth: 06-Jan-1951                  R TKA on 10-18-21 DCP: Home with daughter and son. 1 story home with 0 steps. DME: No needs. Has RW & 3in1. PT: EO 10-21-21   DME Arranged:  N/A DME Agency:     HH Arranged:    HH Agency:     Additional Comments: Please contact me with any questions of if this plan should need to change.  Ennis Forts, RN,CCM [facility]  [phone] 10/18/2021, 12:37 PM

## 2021-10-18 NOTE — Interval H&P Note (Signed)
History and Physical Interval Note:  10/18/2021 8:22 AM  Darlene [name]  has presented today for surgery, with the diagnosis of right knee osteoarthritis.  The various methods of treatment have been discussed with the patient and family. After consideration of risks, benefits and other options for treatment, the patient has consented to  Procedure(s): TOTAL KNEE ARTHROPLASTY (Right) as a surgical intervention.  The patient's history has been reviewed, patient examined, no change in status, stable for surgery.  I have reviewed the patient's chart and labs.  Questions were answered to the patient's satisfaction.     [name]

## 2021-10-18 NOTE — Evaluation (Signed)
Physical Therapy Evaluation Patient Details Name: Darlene [name] MRN: [id number] DOB: 08-25-1951 Today's Date: 10/18/2021  History of Present Illness  Patient is 71 y.o. female s/p Rt TKA on 10/18/21 with PMH significant for anxiety, depression, HTN, HLD, OA, osteopenia, Lt TKA in 2021.    Clinical Impression  Bernise Sylvain Bokhari is a 71 y.o. female POD 0 s/p Rt TKA. Patient reports independence with mobility at baseline. Patient is now limited by functional impairments (see PT problem list below) and requires min assist for transfers and gait with RW. Patient was able to ambulate ~4 feet with RW and min assist to take small steps from bed to recliner. Patient instructed in exercise to facilitate circulation to manage edema. Patient will benefit from continued skilled PT interventions to address impairments and progress towards PLOF. Acute PT will follow to progress mobility and stair training in preparation for safe discharge home.        Recommendations for follow up therapy are one component of a multi-disciplinary discharge planning process, led by the attending physician.  Recommendations may be updated based on patient status, additional functional criteria and insurance authorization.  Follow Up Recommendations Follow physician's recommendations for discharge plan and follow up therapies    Assistance Recommended at Discharge Frequent or constant Supervision/Assistance  Patient can return home with the following  A little help with walking and/or transfers;A little help with bathing/dressing/bathroom;Assistance with cooking/housework;Help with stairs or ramp for entrance;Assist for transportation    Equipment Recommendations None recommended by PT  Recommendations for Other Services       Functional Status Assessment Patient has had a recent decline in their functional status and demonstrates the ability to make significant improvements in function in a reasonable and  predictable amount of time.     Precautions / Restrictions Precautions Precautions: Fall Restrictions Weight Bearing Restrictions: No RLE Weight Bearing: Weight bearing as tolerated      Mobility  Bed Mobility Overal bed mobility: Needs Assistance Bed Mobility: Supine to Sit     Supine to sit: Min guard, HOB elevated     General bed mobility comments: cues to use bed rail and pt taking extra time.    Transfers Overall transfer level: Needs assistance Equipment used: Rolling walker (2 wheels) Transfers: Sit to/from Stand, Bed to chair/wheelchair/BSC     Step pivot transfers: Min assist       General transfer comment: cues for hand placement with RW and assist for power up and to steady in rise. small steps with RW and assist to sequence/pivot from EOB to recliner.    Ambulation/Gait Ambulation/Gait assistance: Min assist Gait Distance (Feet): 4 Feet Assistive device: Rolling walker (2 wheels) Gait Pattern/deviations: Step-to pattern, Decreased stride length, Decreased weight shift to right, Decreased stance time - right Gait velocity: decr     General Gait Details: pt took small steps to recliner from EOB, min assist to manage RW.  Stairs            Wheelchair Mobility    Modified Rankin (Stroke Patients Only)       Balance                                             Pertinent Vitals/Pain Pain Assessment Pain Assessment: Faces Faces Pain Scale: Hurts even more Pain Location: Rt knee Pain Descriptors / Indicators: Aching, Discomfort, Grimacing Pain Intervention(s):  Limited activity within patient's tolerance, Monitored during session, Repositioned    Home Living Family/patient expects to be discharged to:: Private residence Living Arrangements: Children Available Help at Discharge: Family Type of Home: House Home Access: Level entry       Home Layout: One level Home Equipment: Agricultural consultant (2 wheels);BSC/3in1;Shower  seat;Cane - single point      Prior Function Prior Level of Function : Independent/Modified Independent                     Hand Dominance        Extremity/Trunk Assessment   Upper Extremity Assessment Upper Extremity Assessment: Generalized weakness    Lower Extremity Assessment Lower Extremity Assessment: Generalized weakness;RLE deficits/detail RLE Deficits / Details: pt limite by pain and general weakness, unable to do SLR, no buckling in standing RLE Sensation: WNL RLE Coordination: WNL    Cervical / Trunk Assessment Cervical / Trunk Assessment: Normal  Communication   Communication: No difficulties  Cognition Arousal/Alertness: Awake/alert Behavior During Therapy: WFL for tasks assessed/performed Overall Cognitive Status: Within Functional Limits for tasks assessed                                          General Comments      Exercises Total Joint Exercises Ankle Circles/Pumps: AROM, Both, Seated, 10 reps   Assessment/Plan    PT Assessment Patient needs continued PT services  PT Problem List Decreased strength;Decreased range of motion;Decreased activity tolerance;Decreased balance;Decreased mobility;Decreased knowledge of use of DME;Decreased knowledge of precautions;Pain;Obesity       PT Treatment Interventions Gait training;DME instruction;Stair training;Functional mobility training;Therapeutic activities;Therapeutic exercise;Balance training;Patient/family education    PT Goals (Current goals can be found in the Care Plan section)  Acute Rehab PT Goals Patient Stated Goal: stop hurting and get independent again PT Goal Formulation: With patient Time For Goal Achievement: 10/25/21 Potential to Achieve Goals: Good    Frequency 7X/week     Co-evaluation               AM-PAC PT "6 Clicks" Mobility  Outcome Measure Help needed turning from your back to your side while in a flat bed without using bedrails?: A  Little Help needed moving from lying on your back to sitting on the side of a flat bed without using bedrails?: A Little Help needed moving to and from a bed to a chair (including a wheelchair)?: A Little Help needed standing up from a chair using your arms (e.g., wheelchair or bedside chair)?: A Little Help needed to walk in hospital room?: A Little Help needed climbing 3-5 steps with a railing? : A Lot 6 Click Score: 17    End of Session Equipment Utilized During Treatment: Gait belt Activity Tolerance: Patient tolerated treatment well Patient left: in chair;with call bell/phone within reach;with chair alarm set;with family/visitor present Nurse Communication: Mobility status;Patient requests pain meds PT Visit Diagnosis: Muscle weakness (generalized) (M62.81);Difficulty in walking, not elsewhere classified (R26.2)    Time: 0973-5329 PT Time Calculation (min) (ACUTE ONLY): 28 min   Charges:   PT Evaluation $PT Eval Low Complexity: 1 Low PT Treatments $Therapeutic Activity: 8-22 mins        Wynn Maudlin, DPT Acute Rehabilitation Services Office [phone] Pager [phone]   Anitra Lauth 10/18/2021, 4:59 PM

## 2021-10-18 NOTE — Progress Notes (Signed)
Orthopedic Tech Progress Note Patient Details:  Darlene [name] [facility] 1951/02/27 [id number]  CPM Right Knee CPM Right Knee: On Right Knee Flexion (Degrees): 40 Right Knee Extension (Degrees): 10  Post Interventions Patient Tolerated: Well Instructions Provided: Care of device, Adjustment of device  Darlene [name] 10/18/2021, 12:40 PM

## 2021-10-18 NOTE — Anesthesia Procedure Notes (Signed)
Anesthesia Regional Block: Adductor canal block   Pre-Anesthetic Checklist: , timeout performed,  Correct Patient, Correct Site, Correct Laterality,  Correct Procedure, Correct Position, site marked,  Risks and benefits discussed,  Pre-op evaluation,  At surgeon's request and post-op pain management  Laterality: Right  Prep: Maximum Sterile Barrier Precautions used, chloraprep       Needles:  Injection technique: Single-shot  Needle Type: Echogenic Stimulator Needle     Needle Length: 9cm  Needle Gauge: 22     Additional Needles:   Procedures:,,,, ultrasound used (permanent image in chart),,    Narrative:  Start time: 10/18/2021 9:54 AM End time: 10/18/2021 9:57 AM Injection made incrementally with aspirations every 5 mL.  Performed by: Personally  Anesthesiologist: Brennan Bailey, MD  Additional Notes: Risks, benefits, and alternative discussed. Patient gave consent for procedure. Patient prepped and draped in sterile fashion. Sedation administered, patient remains easily responsive to voice. Relevant anatomy identified with ultrasound guidance. Local anesthetic given in 5cc increments with no signs or symptoms of intravascular injection. No pain or paraesthesias with injection. Patient monitored throughout procedure with signs of LAST or immediate complications. Tolerated well. Ultrasound image placed in chart.  Tawny Asal, MD

## 2021-10-18 NOTE — Discharge Instructions (Signed)
 Frank Aluisio, MD Total Joint Specialist [facility] [address] [place], [place] [address] [phone]  TOTAL KNEE REPLACEMENT POSTOPERATIVE DIRECTIONS    Knee Rehabilitation, Guidelines Following Surgery  Results after knee surgery are often greatly improved when you follow the exercise, range of motion and muscle strengthening exercises prescribed by your doctor. Safety measures are also important to protect the knee from further injury. If any of these exercises cause you to have increased pain or swelling in your knee joint, decrease the amount until you are comfortable again and slowly increase them. If you have problems or questions, call your caregiver or physical therapist for advice.   BLOOD CLOT PREVENTION Take a 325 mg Aspirin two times a day for three weeks following surgery. Then take an 81 mg Aspirin once a day for three weeks. Then discontinue Aspirin. You may resume your vitamins/supplements upon discharge from the hospital. Do not take any NSAIDs (Advil, Aleve, Ibuprofen, Meloxicam, etc.) until you have discontinued the 325 mg Aspirin.  HOME CARE INSTRUCTIONS  Remove items at home which could result in a fall. This includes throw rugs or furniture in walking pathways.  ICE to the affected knee as much as tolerated. Icing helps control swelling. If the swelling is well controlled you will be more comfortable and rehab easier. Continue to use ice on the knee for pain and swelling from surgery. You may notice swelling that will progress down to the foot and ankle. This is normal after surgery. Elevate the leg when you are not up walking on it.    Continue to use the breathing machine which will help keep your temperature down. It is common for your temperature to cycle up and down following surgery, especially at night when you are not up moving around and exerting yourself. The breathing machine keeps your lungs expanded and your temperature  down. Do not place pillow under the operative knee, focus on keeping the knee straight while resting  DIET You may resume your previous home diet once you are discharged from the hospital.  DRESSING / WOUND CARE / SHOWERING Keep your bulky bandage on for 2 days. On the third post-operative day you may remove the Ace bandage and gauze. There is a waterproof adhesive bandage on your skin which will stay in place until your first follow-up appointment. Once you remove this you will not need to place another bandage You may begin showering 3 days following surgery, but do not submerge the incision under water.  ACTIVITY For the first 5 days, the key is rest and control of pain and swelling Do your home exercises twice a day starting on post-operative day 3. On the days you go to physical therapy, just do the home exercises once that day. You should rest, ice and elevate the leg for 50 minutes out of every hour. Get up and walk/stretch for 10 minutes per hour. After 5 days you can increase your activity slowly as tolerated. Walk with your walker as instructed. Use the walker until you are comfortable transitioning to a cane. Walk with the cane in the opposite hand of the operative leg. You may discontinue the cane once you are comfortable and walking steadily. Avoid periods of inactivity such as sitting longer than an hour when not asleep. This helps prevent blood clots.  You may discontinue the knee immobilizer once you are able to perform a straight leg raise while lying down. You may resume a sexual relationship in one month   or when given the OK by your doctor.  You may return to work once you are cleared by your doctor.  Do not drive a car for 6 weeks or until released by your surgeon.  Do not drive while taking narcotics.  TED HOSE STOCKINGS Wear the elastic stockings on both legs for three weeks following surgery during the day. You may remove them at night for sleeping.  WEIGHT  BEARING Weight bearing as tolerated with assist device (walker, cane, etc) as directed, use it as long as suggested by your surgeon or therapist, typically at least 4-6 weeks.  POSTOPERATIVE CONSTIPATION PROTOCOL Constipation - defined medically as fewer than three stools per week and severe constipation as less than one stool per week.  One of the most common issues patients have following surgery is constipation.  Even if you have a regular bowel pattern at home, your normal regimen is likely to be disrupted due to multiple reasons following surgery.  Combination of anesthesia, postoperative narcotics, change in appetite and fluid intake all can affect your bowels.  In order to avoid complications following surgery, here are some recommendations in order to help you during your recovery period.  Colace (docusate) - Pick up an over-the-counter form of Colace or another stool softener and take twice a day as long as you are requiring postoperative pain medications.  Take with a full glass of water daily.  If you experience loose stools or diarrhea, hold the colace until you stool forms back up. If your symptoms do not get better within 1 week or if they get worse, check with your doctor. Dulcolax (bisacodyl) - Pick up over-the-counter and take as directed by the product packaging as needed to assist with the movement of your bowels.  Take with a full glass of water.  Use this product as needed if not relieved by Colace only.  MiraLax (polyethylene glycol) - Pick up over-the-counter to have on hand. MiraLax is a solution that will increase the amount of water in your bowels to assist with bowel movements.  Take as directed and can mix with a glass of water, juice, soda, coffee, or tea. Take if you go more than two days without a movement. Do not use MiraLax more than once per day. Call your doctor if you are still constipated or irregular after using this medication for 7 days in a row.  If you continue  to have problems with postoperative constipation, please contact the office for further assistance and recommendations.  If you experience "the worst abdominal pain ever" or develop nausea or vomiting, please contact the office immediatly for further recommendations for treatment.  ITCHING If you experience itching with your medications, try taking only a single pain pill, or even half a pain pill at a time.  You can also use Benadryl over the counter for itching or also to help with sleep.   MEDICATIONS See your medication summary on the "After Visit Summary" that the nursing staff will review with you prior to discharge.  You may have some home medications which will be placed on hold until you complete the course of blood thinner medication.  It is important for you to complete the blood thinner medication as prescribed by your surgeon.  Continue your approved medications as instructed at time of discharge.  PRECAUTIONS If you experience chest pain or shortness of breath - call 911 immediately for transfer to the hospital emergency department.  If you develop a fever greater that 101 F,   purulent drainage from wound, increased redness or drainage from wound, foul odor from the wound/dressing, or calf pain - CONTACT YOUR SURGEON.                                                   FOLLOW-UP APPOINTMENTS Make sure you keep all of your appointments after your operation with your surgeon and caregivers. You should call the office at the above phone number and make an appointment for approximately two weeks after the date of your surgery or on the date instructed by your surgeon outlined in the "After Visit Summary".  RANGE OF MOTION AND STRENGTHENING EXERCISES  Rehabilitation of the knee is important following a knee injury or an operation. After just a few days of immobilization, the muscles of the thigh which control the knee become weakened and shrink (atrophy). Knee exercises are designed to build up  the tone and strength of the thigh muscles and to improve knee motion. Often times heat used for twenty to thirty minutes before working out will loosen up your tissues and help with improving the range of motion but do not use heat for the first two weeks following surgery. These exercises can be done on a training (exercise) mat, on the floor, on a table or on a bed. Use what ever works the best and is most comfortable for you Knee exercises include:  Leg Lifts - While your knee is still immobilized in a splint or cast, you can do straight leg raises. Lift the leg to 60 degrees, hold for 3 sec, and slowly lower the leg. Repeat 10-20 times 2-3 times daily. Perform this exercise against resistance later as your knee gets better.  Quad and Hamstring Sets - Tighten up the muscle on the front of the thigh (Quad) and hold for 5-10 sec. Repeat this 10-20 times hourly. Hamstring sets are done by pushing the foot backward against an object and holding for 5-10 sec. Repeat as with quad sets.  Leg Slides: Lying on your back, slowly slide your foot toward your buttocks, bending your knee up off the floor (only go as far as is comfortable). Then slowly slide your foot back down until your leg is flat on the floor again. Angel Wings: Lying on your back spread your legs to the side as far apart as you can without causing discomfort.  A rehabilitation program following serious knee injuries can speed recovery and prevent re-injury in the future due to weakened muscles. Contact your doctor or a physical therapist for more information on knee rehabilitation.   POST-OPERATIVE OPIOID TAPER INSTRUCTIONS: It is important to wean off of your opioid medication as soon as possible. If you do not need pain medication after your surgery it is ok to stop day one. Opioids include: Codeine, Hydrocodone(Norco, Vicodin), Oxycodone(Percocet, oxycontin) and hydromorphone amongst others.  Long term and even short term use of opiods can  cause: Increased pain response Dependence Constipation Depression Respiratory depression And more.  Withdrawal symptoms can include Flu like symptoms Nausea, vomiting And more Techniques to manage these symptoms Hydrate well Eat regular healthy meals Stay active Use relaxation techniques(deep breathing, meditating, yoga) Do Not substitute Alcohol to help with tapering If you have been on opioids for less than two weeks and do not have pain than it is ok to stop all together.  Plan   to wean off of opioids This plan should start within one week post op of your joint replacement. Maintain the same interval or time between taking each dose and first decrease the dose.  Cut the total daily intake of opioids by one tablet each day Next start to increase the time between doses. The last dose that should be eliminated is the evening dose.   IF YOU ARE TRANSFERRED TO A SKILLED REHAB FACILITY If the patient is transferred to a skilled rehab facility following release from the hospital, a list of the current medications will be sent to the facility for the patient to continue.  When discharged from the skilled rehab facility, please have the facility set up the patient's Home Health Physical Therapy prior to being released. Also, the skilled facility will be responsible for providing the patient with their medications at time of release from the facility to include their pain medication, the muscle relaxants, and their blood thinner medication. If the patient is still at the rehab facility at time of the two week follow up appointment, the skilled rehab facility will also need to assist the patient in arranging follow up appointment in our office and any transportation needs.  MAKE SURE YOU:  Understand these instructions.  Get help right away if you are not doing well or get worse.   DENTAL ANTIBIOTICS:  In most cases prophylactic antibiotics for Dental procdeures after total joint surgery are  not necessary.  Exceptions are as follows:  1. History of prior total joint infection  2. Severely immunocompromised (Organ Transplant, cancer chemotherapy, Rheumatoid biologic meds such as Humera)  3. Poorly controlled diabetes (A1C &gt; 8.0, blood glucose over 200)  If you have one of these conditions, contact your surgeon for an antibiotic prescription, prior to your dental procedure.    Pick up stool softner and laxative for home use following surgery while on pain medications. Do not submerge incision under water. Please use good hand washing techniques while changing dressing each day. May shower starting three days after surgery. Please use a clean towel to pat the incision dry following showers. Continue to use ice for pain and swelling after surgery. Do not use any lotions or creams on the incision until instructed by your surgeon.  

## 2021-10-19 ENCOUNTER — Encounter (HOSPITAL_COMMUNITY): Payer: Self-pay | Admitting: Orthopedic Surgery

## 2021-10-19 DIAGNOSIS — M1711 Unilateral primary osteoarthritis, right knee: Secondary | ICD-10-CM | POA: Diagnosis not present

## 2021-10-19 DIAGNOSIS — Z96652 Presence of left artificial knee joint: Secondary | ICD-10-CM | POA: Diagnosis not present

## 2021-10-19 DIAGNOSIS — R7303 Prediabetes: Secondary | ICD-10-CM | POA: Diagnosis not present

## 2021-10-19 DIAGNOSIS — I10 Essential (primary) hypertension: Secondary | ICD-10-CM | POA: Diagnosis not present

## 2021-10-19 LAB — BASIC METABOLIC PANEL
Anion gap: 7 (ref 5–15)
BUN: 13 mg/dL (ref 8–23)
CO2: 25 mmol/L (ref 22–32)
Calcium: 8.6 mg/dL — ABNORMAL LOW (ref 8.9–10.3)
Chloride: 103 mmol/L (ref 98–111)
Creatinine, Ser: 0.67 mg/dL (ref 0.44–1.00)
GFR, Estimated: >60 mL/min (ref >60)
Glucose, Bld: 118 mg/dL — ABNORMAL HIGH (ref 70–99)
Potassium: 3.7 mmol/L (ref 3.5–5.1)
Sodium: 135 mmol/L (ref 135–145)

## 2021-10-19 LAB — CBC
HCT: 35.5 % — ABNORMAL LOW (ref 36.0–46.0)
Hemoglobin: 11.3 g/dL — ABNORMAL LOW (ref 12.0–15.0)
MCH: 28.1 pg (ref 26.0–34.0)
MCHC: 31.8 g/dL (ref 30.0–36.0)
MCV: 88.3 fL (ref 80.0–100.0)
Platelets: 265 10*3/uL (ref 150–400)
RBC: 4.02 MIL/uL (ref 3.87–5.11)
RDW: 13.2 % (ref 11.5–15.5)
WBC: 10.3 10*3/uL (ref 4.0–10.5)
nRBC: 0 % (ref 0.0–0.2)

## 2021-10-19 MED ORDER — TRAMADOL HCL 50 MG PO TABS: 50.0000 mg | ORAL_TABLET | Freq: Four times a day (QID) | ORAL | 0 refills | Status: DC | PRN

## 2021-10-19 MED ORDER — METHOCARBAMOL 500 MG PO TABS: 500.0000 mg | ORAL_TABLET | Freq: Four times a day (QID) | ORAL | 0 refills | Status: DC | PRN

## 2021-10-19 MED ORDER — ASPIRIN 325 MG PO TBEC: 325.0000 mg | DELAYED_RELEASE_TABLET | Freq: Two times a day (BID) | ORAL | 0 refills | Status: AC

## 2021-10-19 MED ORDER — SODIUM CHLORIDE 0.9 % IV BOLUS
250.0000 mL | Freq: Once | INTRAVENOUS | Status: AC
  Administered 2021-10-19: 10:00:00 250 mL via INTRAVENOUS

## 2021-10-19 MED ORDER — HYDROCODONE-ACETAMINOPHEN 5-325 MG PO TABS: 1.0000 | ORAL_TABLET | Freq: Four times a day (QID) | ORAL | 0 refills | Status: AC | PRN

## 2021-10-19 MED ORDER — GABAPENTIN 300 MG PO CAPS: ORAL_CAPSULE | ORAL | 0 refills | Status: DC

## 2021-10-19 NOTE — Progress Notes (Signed)
Physical Therapy Treatment Patient Details Name: Darlene [name] MRN: [id number] DOB: [birth year] Today's Date: 10/19/2021   History of Present Illness Patient is 71 y.o. female s/p Rt TKA on 10/18/21 with PMH significant for anxiety, depression, HTN, HLD, OA, osteopenia, Lt TKA in 2021.    PT Comments    Patient making slow and limited progress due to pain and overall weakness. Pt ambulated ~9' with RW and requesting to stand and rest multiple times requiring encouragement to continue. EOS pt required assist to complete heel slides and tactile cues for quad set on Rt knee. Discussed recommendation for ST rehab at SNF to improve safety and mobility prior to return home. Pt agreeable and RN notified MD and SW. Acute PT will continue to progress pt as able throughout her stay.    Recommendations for follow up therapy are one component of a multi-disciplinary discharge planning process, led by the attending physician.  Recommendations may be updated based on patient status, additional functional criteria and insurance authorization.  Follow Up Recommendations  Skilled nursing-short term rehab (<3 hours/day)     Assistance Recommended at Discharge Frequent or constant Supervision/Assistance  Patient can return home with the following A little help with walking and/or transfers;A little help with bathing/dressing/bathroom;Assistance with cooking/housework;Help with stairs or ramp for entrance;Assist for transportation   Equipment Recommendations  None recommended by PT    Recommendations for Other Services       Precautions / Restrictions Precautions Precautions: Fall Restrictions Weight Bearing Restrictions: No RLE Weight Bearing: Weight bearing as tolerated     Mobility  Bed Mobility Overal bed mobility: Needs Assistance Bed Mobility: Supine to Sit     Supine to sit: Min guard, HOB elevated     General bed mobility comments: pt OOB to recliner    Transfers Overall  transfer level: Needs assistance Equipment used: Rolling walker (2 wheels) Transfers: Sit to/from Stand, Bed to chair/wheelchair/BSC     Step pivot transfers: Min assist       General transfer comment: pt slow to initiate and cues for hand placement to power up with bil UE's from chair required. min assist to steady and complete rise.    Ambulation/Gait Ambulation/Gait assistance: Min assist Gait Distance (Feet): 9 Feet Assistive device: Rolling walker (2 wheels) Gait Pattern/deviations: Step-to pattern, Decreased stride length, Decreased weight shift to right, Decreased stance time - right Gait velocity: decr     General Gait Details: pt requires min assist to manage RW and Moderate cuing to advance bil LE's for gait. pt with slow cadence and poor foot clearance during swing phase on Rt LE. cues needed to flex knee and dorsiflex foot. distance limited by pain   Stairs             Wheelchair Mobility    Modified Rankin (Stroke Patients Only)       Balance Overall balance assessment: Needs assistance Sitting-balance support: Feet supported Sitting balance-Leahy Scale: Fair     Standing balance support: Reliant on assistive device for balance, During functional activity, Bilateral upper extremity supported Standing balance-Leahy Scale: Poor                              Cognition Arousal/Alertness: Awake/alert Behavior During Therapy: WFL for tasks assessed/performed Overall Cognitive Status: Within Functional Limits for tasks assessed  Exercises Total Joint Exercises Ankle Circles/Pumps: AROM, Both, Seated, 10 reps Quad Sets: AROM, Both, Seated, 10 reps Heel Slides: Both, Seated, 10 reps, AAROM    General Comments        Pertinent Vitals/Pain Pain Assessment Pain Assessment: 0-10 Pain Score: 8  Faces Pain Scale: Hurts even more Pain Location: Rt knee Pain Descriptors / Indicators:  Aching, Discomfort, Grimacing Pain Intervention(s): Limited activity within patient's tolerance, Monitored during session, Repositioned, Patient requesting pain meds-RN notified, Ice applied    Home Living                          Prior Function            PT Goals (current goals can now be found in the care plan section) Acute Rehab PT Goals Patient Stated Goal: stop hurting and get independent again PT Goal Formulation: With patient Time For Goal Achievement: 10/25/21 Potential to Achieve Goals: Good Progress towards PT goals: Progressing toward goals (slow)    Frequency    7X/week      PT Plan Current plan remains appropriate    Co-evaluation              AM-PAC PT "6 Clicks" Mobility   Outcome Measure  Help needed turning from your back to your side while in a flat bed without using bedrails?: A Little Help needed moving from lying on your back to sitting on the side of a flat bed without using bedrails?: A Little Help needed moving to and from a bed to a chair (including a wheelchair)?: A Little Help needed standing up from a chair using your arms (e.g., wheelchair or bedside chair)?: A Little Help needed to walk in hospital room?: A Little Help needed climbing 3-5 steps with a railing? : A Lot 6 Click Score: 17    End of Session Equipment Utilized During Treatment: Gait belt Activity Tolerance: Patient tolerated treatment well Patient left: in chair;with call bell/phone within reach;with chair alarm set;with family/visitor present Nurse Communication: Mobility status;Patient requests pain meds PT Visit Diagnosis: Muscle weakness (generalized) (M62.81);Difficulty in walking, not elsewhere classified (R26.2)     Time: 2542-7062 PT Time Calculation (min) (ACUTE ONLY): 22 min  Charges:  $Gait Training: 8-22 mins                     Wynn Maudlin, DPT Acute Rehabilitation Services Office [phone] Pager [phone]    Anitra Lauth 10/19/2021, 4:35 PM

## 2021-10-19 NOTE — Progress Notes (Signed)
Subjective: 1 Day Post-Op Procedure(s) (LRB): TOTAL KNEE ARTHROPLASTY (Right) Patient reports pain as mild.   Patient seen in rounds by Dr. Lequita Halt. Patient is well, and has had no acute complaints or problems. No issues overnight, foley catheter removed this AM. Denies chest pain or SOB. We will continue therapy today.  Objective: Vital signs in last 24 hours: Temp:  [97.3 F (36.3 C)-97.9 F (36.6 C)] 97.6 F (36.4 C) (01/24 0610) Pulse Rate:  [52-83] 64 (01/24 0610) Resp:  [12-19] 17 (01/24 0610) BP: (93-151)/(52-77) 114/52 (01/24 0610) SpO2:  [96 %-100 %] 100 % (01/24 0610) Weight:  [84.8 kg] 84.8 kg (01/23 0831)  Intake/Output from previous day:  Intake/Output Summary (Last 24 hours) at 10/19/2021 0802 Last data filed at 10/19/2021 0614 Gross per 24 hour  Intake 1857.71 ml  Output 1575 ml  Net 282.71 ml     Intake/Output this shift: No intake/output data recorded.  Labs: Recent Labs    10/19/21 0340  HGB 11.3*   Recent Labs    10/19/21 0340  WBC 10.3  RBC 4.02  HCT 35.5*  PLT 265   Recent Labs    10/19/21 0340  NA 135  K 3.7  CL 103  CO2 25  BUN 13  CREATININE 0.67  GLUCOSE 118*  CALCIUM 8.6*   No results for input(s): LABPT, INR in the last 72 hours.  Exam: General - Patient is Alert and Oriented Extremity - Neurologically intact Neurovascular intact Sensation intact distally Dorsiflexion/Plantar flexion intact Dressing - dressing C/D/I Motor Function - intact, moving foot and toes well on exam.   Past Medical History:  Diagnosis Date   Anxiety    Cataracts, bilateral    md just watching    Constipation    Depression    Elevated LFTs    history of, normal abdominal ultrasound, LFT's normal 01/2007   Gallbladder problem    History of chest pain    rulre out MI in 12/2006.   Hyperlipidemia    diet controlled   Hypertension    Obesity    Osteoarthritis of both knees 12/26/2007   Xray of knee 10/2007: Bilateral osteoarthritis,  sent to PT but did not complete tx course.     Osteopenia 07/05/2015   Prediabetes 11/30/2015    Assessment/Plan: 1 Day Post-Op Procedure(s) (LRB): TOTAL KNEE ARTHROPLASTY (Right) Principal Problem:   Primary osteoarthritis of right knee  Estimated body mass index is 37.77 kg/m as calculated from the following:   Height as of this encounter: 4\' 11"  (1.499 m).   Weight as of this encounter: 84.8 kg. Advance diet Up with therapy D/C IV fluids   Patient's anticipated LOS is less than 2 midnights, meeting these requirements: - Lives within 1 hour of care - Has a competent adult at home to recover with post-op recover - NO history of  - Chronic pain requiring opioids  - Diabetes  - Coronary Artery Disease  - Heart failure  - Heart attack  - Stroke  - DVT/VTE  - Cardiac arrhythmia  - Respiratory Failure/COPD  - Renal failure  - Anemia  - Advanced Liver disease   DVT Prophylaxis - Aspirin Weight bearing as tolerated. Continue therapy.  Plan is to go Home after hospital stay. May have slower progression with mobility, possible discharge this afternoon if doing well. Scheduled [facility] at [facility] Follow-up in the office in 2 weeks  The PDMP database was reviewed today prior to any opioid medications being prescribed to this patient.  Arther Abbott, PA-C Orthopedic Surgery [phone] 10/19/2021, 8:02 AM

## 2021-10-19 NOTE — Progress Notes (Addendum)
Physical Therapy Treatment Patient Details Name: Darlene [name] MRN: [id number] DOB: [birth year] Today's Date: 10/19/2021   History of Present Illness Patient is 71 y.o. female s/p Rt TKA on 10/18/21 with PMH significant for anxiety, depression, HTN, HLD, OA, osteopenia, Lt TKA in 2021.    PT Comments    Patient has somewhat flat affect and is limited by dizziness that appears constant and pain. Pt requires much encouragement to participate and attempt mobility. BP stable with no orthostatic drop with mobility. Pt ambulated ~2 steps and c/o fatigue requesting to be done, encouraged to attempt further distance and pt ambulated to doorway and seated rest provided. Pt requesting to use bathroom and BSC brought close for stand step transfer with RW and min assist to steady. EOS pt returned to recliner and educated on ankle pumps for circulation. Overall pt is making limited progress and will likely require SNF for rehab prior to return home.    Recommendations for follow up therapy are one component of a multi-disciplinary discharge planning process, led by the attending physician.  Recommendations may be updated based on patient status, additional functional criteria and insurance authorization.  Follow Up Recommendations  Skilled nursing-short term rehab (<3 hours/day)     Assistance Recommended at Discharge Frequent or constant Supervision/Assistance  Patient can return home with the following A little help with walking and/or transfers;A little help with bathing/dressing/bathroom;Assistance with cooking/housework;Help with stairs or ramp for entrance;Assist for transportation   Equipment Recommendations  None recommended by PT    Recommendations for Other Services       Precautions / Restrictions Precautions Precautions: Fall Restrictions Weight Bearing Restrictions: No RLE Weight Bearing: Weight bearing as tolerated     Mobility  Bed Mobility Overal bed mobility: Needs  Assistance Bed Mobility: Supine to Sit     Supine to sit: Min guard, HOB elevated     General bed mobility comments: cues to use bed rail and pt taking extra time.    Transfers Overall transfer level: Needs assistance Equipment used: Rolling walker (2 wheels) Transfers: Sit to/from Stand, Bed to chair/wheelchair/BSC     Step pivot transfers: Min assist       General transfer comment: cues for hand placement with RW and assist for power up and to steady in rise. small steps with RW and assist to sequence/pivot from EOB to recliner.    Ambulation/Gait Ambulation/Gait assistance: Min assist   Assistive device: Rolling walker (2 wheels) Gait Pattern/deviations: Step-to pattern, Decreased stride length, Decreased weight shift to right, Decreased stance time - right Gait velocity: decr     General Gait Details: pt took small steps to recliner from EOB, min assist to manage RW.   Stairs             Wheelchair Mobility    Modified Rankin (Stroke Patients Only)       Balance Overall balance assessment: Needs assistance Sitting-balance support: Feet supported Sitting balance-Leahy Scale: Fair     Standing balance support: Reliant on assistive device for balance, During functional activity, Bilateral upper extremity supported                                Cognition Arousal/Alertness: Awake/alert Behavior During Therapy: WFL for tasks assessed/performed Overall Cognitive Status: Within Functional Limits for tasks assessed  Exercises Total Joint Exercises Ankle Circles/Pumps: AROM, Both, Seated, 10 reps    General Comments        Pertinent Vitals/Pain Pain Assessment Pain Assessment: Faces Faces Pain Scale: Hurts even more Pain Location: Rt knee Pain Descriptors / Indicators: Aching, Discomfort, Grimacing Pain Intervention(s): Limited activity within patient's tolerance, Monitored  during session, Repositioned, Ice applied    Home Living                          Prior Function            PT Goals (current goals can now be found in the care plan section) Acute Rehab PT Goals Patient Stated Goal: stop hurting and get independent again PT Goal Formulation: With patient Time For Goal Achievement: 10/25/21 Potential to Achieve Goals: Good Progress towards PT goals: Progressing toward goals (slow)    Frequency    7X/week      PT Plan      Co-evaluation              AM-PAC PT "6 Clicks" Mobility   Outcome Measure  Help needed turning from your back to your side while in a flat bed without using bedrails?: A Little Help needed moving from lying on your back to sitting on the side of a flat bed without using bedrails?: A Little Help needed moving to and from a bed to a chair (including a wheelchair)?: A Little Help needed standing up from a chair using your arms (e.g., wheelchair or bedside chair)?: A Little Help needed to walk in hospital room?: A Little Help needed climbing 3-5 steps with a railing? : A Lot 6 Click Score: 17    End of Session Equipment Utilized During Treatment: Gait belt Activity Tolerance: Patient tolerated treatment well Patient left: in chair;with call bell/phone within reach;with chair alarm set;with family/visitor present Nurse Communication: Mobility status;Patient requests pain meds PT Visit Diagnosis: Muscle weakness (generalized) (M62.81);Difficulty in walking, not elsewhere classified (R26.2)     Time: 1020-1101 PT Time Calculation (min) (ACUTE ONLY): 41 min  Charges:  $Gait Training: 8-22 mins $Therapeutic Activity: 23-37 mins                     Verner Mould, DPT Acute Rehabilitation Services Office [phone] Pager [phone]    Darlene [name] 10/19/2021, 11:41 AM

## 2021-10-19 NOTE — Plan of Care (Signed)

## 2021-10-19 NOTE — TOC Transition Note (Signed)
Transition of Care [facility]) - CM/SW Discharge Note  Patient Details  Name: Darlene [name] MRN: [id number] Date of Birth: 1951-08-03  [facility] of [facility] CM/SW Contact:  Sherie Don, LCSW Phone Number: 10/19/2021, 9:30 AM  Clinical Narrative: Patient is expected to discharge home after working with PT. CSW met with patient to confirm discharge plan. Patient will discharge home with OPPT at [facility] with the first appointment scheduled for 10/21/21. Patient has a rolling walker and 3N1 at home, so there are no DME needs at this time. TOC signing off.  Final next level of care: OP Rehab Barriers to Discharge: No Barriers Identified  Patient Goals and CMS Choice Patient states their goals for this hospitalization and ongoing recovery are:: Discharge home with OPPT at [facility] Choice offered to / list presented to : NA  Discharge Plan and Services      DME Arranged: N/A DME Agency: NA  Readmission Risk Interventions No flowsheet data found.

## 2021-10-20 DIAGNOSIS — R7303 Prediabetes: Secondary | ICD-10-CM | POA: Diagnosis not present

## 2021-10-20 DIAGNOSIS — M1711 Unilateral primary osteoarthritis, right knee: Secondary | ICD-10-CM | POA: Diagnosis not present

## 2021-10-20 DIAGNOSIS — I10 Essential (primary) hypertension: Secondary | ICD-10-CM | POA: Diagnosis not present

## 2021-10-20 DIAGNOSIS — Z96652 Presence of left artificial knee joint: Secondary | ICD-10-CM | POA: Diagnosis not present

## 2021-10-20 LAB — BASIC METABOLIC PANEL
Anion gap: 6 (ref 5–15)
BUN: 11 mg/dL (ref 8–23)
CO2: 26 mmol/L (ref 22–32)
Calcium: 8.8 mg/dL — ABNORMAL LOW (ref 8.9–10.3)
Chloride: 104 mmol/L (ref 98–111)
Creatinine, Ser: 0.66 mg/dL (ref 0.44–1.00)
GFR, Estimated: >60 mL/min (ref >60)
Glucose, Bld: 120 mg/dL — ABNORMAL HIGH (ref 70–99)
Potassium: 4.2 mmol/L (ref 3.5–5.1)
Sodium: 136 mmol/L (ref 135–145)

## 2021-10-20 LAB — CBC
HCT: 34.8 % — ABNORMAL LOW (ref 36.0–46.0)
Hemoglobin: 10.9 g/dL — ABNORMAL LOW (ref 12.0–15.0)
MCH: 27.5 pg (ref 26.0–34.0)
MCHC: 31.3 g/dL (ref 30.0–36.0)
MCV: 87.9 fL (ref 80.0–100.0)
Platelets: 259 10*3/uL (ref 150–400)
RBC: 3.96 MIL/uL (ref 3.87–5.11)
RDW: 13.3 % (ref 11.5–15.5)
WBC: 10.5 10*3/uL (ref 4.0–10.5)
nRBC: 0 % (ref 0.0–0.2)

## 2021-10-20 NOTE — Progress Notes (Signed)
Physical Therapy Treatment Patient Details Name: Darlene [name] MRN: [id number] DOB: 09-27-1950 Today's Date: 10/20/2021   History of Present Illness Patient is 71 y.o. female s/p Rt TKA on 10/18/21 with PMH significant for anxiety, depression, HTN, HLD, OA, osteopenia, Lt TKA in 2021.    PT Comments    Patient making good progress with mobility and pt reporting less swimmy headed sensation after medication adjustment. Patient ambulated increased distance of ~60' with RW and no LOB or buckling noted today. Pt's pace remains slow and cautious but is improving as pt's confidence is increasing. EOS educated to continue with heel slides and ankle pumps for ROM and circulation. Anticipate pt will improve with 1 more visit in the morning and be able to discharge home with assist from family. Will progress as able. Updated discharge recs to HHPT.    Recommendations for follow up therapy are one component of a multi-disciplinary discharge planning process, led by the attending physician.  Recommendations may be updated based on patient status, additional functional criteria and insurance authorization.  Follow Up Recommendations  Home health PT     Assistance Recommended at Discharge Frequent or constant Supervision/Assistance  Patient can return home with the following A little help with walking and/or transfers;A little help with bathing/dressing/bathroom;Assistance with cooking/housework;Help with stairs or ramp for entrance;Assist for transportation   Equipment Recommendations  None recommended by PT    Recommendations for Other Services       Precautions / Restrictions Precautions Precautions: Fall Restrictions Weight Bearing Restrictions: No RLE Weight Bearing: Weight bearing as tolerated     Mobility  Bed Mobility               General bed mobility comments: pt OOB to recliner    Transfers Overall transfer level: Needs assistance Equipment used: Rolling walker (2  wheels) Transfers: Sit to/from Stand, Bed to chair/wheelchair/BSC Sit to Stand: Min guard   Step pivot transfers: Min assist       General transfer comment: guarding for safety, pt with good recall for safe hand placement/technique to rise from chair. no assist needed to power up or control lower to chair.    Ambulation/Gait Ambulation/Gait assistance: Min guard Gait Distance (Feet): 60 Feet Assistive device: Rolling walker (2 wheels) Gait Pattern/deviations: Step-to pattern, Decreased stride length, Decreased weight shift to right, Decreased stance time - right Gait velocity: decr     General Gait Details: pt more alert and ready for ambulation. no LOB or buckling with gait and pt tolerated increased distance with no increased in knee pain. pace improved but remains slow and cautious.   Stairs             Wheelchair Mobility    Modified Rankin (Stroke Patients Only)       Balance Overall balance assessment: Needs assistance Sitting-balance support: Feet supported Sitting balance-Leahy Scale: Fair     Standing balance support: Reliant on assistive device for balance, During functional activity, Bilateral upper extremity supported Standing balance-Leahy Scale: Poor                              Cognition Arousal/Alertness: Awake/alert Behavior During Therapy: WFL for tasks assessed/performed Overall Cognitive Status: Within Functional Limits for tasks assessed  Exercises Total Joint Exercises Ankle Circles/Pumps: AROM, Both, Seated, 10 reps Quad Sets: AROM, Both, Seated, 10 reps Heel Slides: Both, Seated, 10 reps, AAROM    General Comments        Pertinent Vitals/Pain Pain Assessment Pain Assessment: 0-10 Pain Score: 6  Faces Pain Scale: Hurts even more Pain Location: Rt knee Pain Descriptors / Indicators: Aching, Discomfort, Grimacing Pain Intervention(s): Limited activity within  patient's tolerance, Monitored during session, Repositioned, Ice applied    Home Living                          Prior Function            PT Goals (current goals can now be found in the care plan section) Acute Rehab PT Goals Patient Stated Goal: stop hurting and get independent again PT Goal Formulation: With patient Time For Goal Achievement: 10/25/21 Potential to Achieve Goals: Good Progress towards PT goals: Progressing toward goals    Frequency    7X/week      PT Plan Current plan remains appropriate    Co-evaluation              AM-PAC PT "6 Clicks" Mobility   Outcome Measure  Help needed turning from your back to your side while in a flat bed without using bedrails?: A Little Help needed moving from lying on your back to sitting on the side of a flat bed without using bedrails?: A Little Help needed moving to and from a bed to a chair (including a wheelchair)?: A Little Help needed standing up from a chair using your arms (e.g., wheelchair or bedside chair)?: A Little Help needed to walk in hospital room?: A Little Help needed climbing 3-5 steps with a railing? : A Lot 6 Click Score: 17    End of Session Equipment Utilized During Treatment: Gait belt Activity Tolerance: Patient tolerated treatment well Patient left: in chair;with call bell/phone within reach;with chair alarm set;with family/visitor present Nurse Communication: Mobility status;Patient requests pain meds PT Visit Diagnosis: Muscle weakness (generalized) (M62.81);Difficulty in walking, not elsewhere classified (R26.2)     Time: 1610-9604 PT Time Calculation (min) (ACUTE ONLY): 22 min  Charges:  $Gait Training: 8-22 mins                     Wynn Maudlin, DPT Acute Rehabilitation Services Office [phone] Pager [phone]    Anitra Lauth 10/20/2021, 3:04 PM

## 2021-10-20 NOTE — Plan of Care (Signed)

## 2021-10-20 NOTE — Progress Notes (Signed)
° °  Subjective: 2 Days Post-Op Procedure(s) (LRB): TOTAL KNEE ARTHROPLASTY (Right) Patient reports pain as mild.   Patient seen in rounds by Dr. Lequita Halt. Patient is well, and has had no acute complaints or problems. Had slower progression with therapy, will continue to work on mobility. Denies chest pain or SOB. Plan is to go Home after hospital stay.  Objective: Vital signs in last 24 hours: Temp:  [97.7 F (36.5 C)-98.6 F (37 C)] 98.2 F (36.8 C) (01/25 0634) Pulse Rate:  [64-97] 81 (01/25 0634) Resp:  [16-17] 17 (01/25 0634) BP: (113-165)/(63-92) 154/92 (01/25 0634) SpO2:  [99 %-100 %] 100 % (01/25 0634)  Intake/Output from previous day:  Intake/Output Summary (Last 24 hours) at 10/20/2021 0831 Last data filed at 10/20/2021 0200 Gross per 24 hour  Intake 1443.91 ml  Output --  Net 1443.91 ml    Intake/Output this shift: No intake/output data recorded.  Labs: Recent Labs    10/19/21 0340 10/20/21 0331  HGB 11.3* 10.9*   Recent Labs    10/19/21 0340 10/20/21 0331  WBC 10.3 10.5  RBC 4.02 3.96  HCT 35.5* 34.8*  PLT 265 259   Recent Labs    10/19/21 0340 10/20/21 0331  NA 135 136  K 3.7 4.2  CL 103 104  CO2 25 26  BUN 13 11  CREATININE 0.67 0.66  GLUCOSE 118* 120*  CALCIUM 8.6* 8.8*   No results for input(s): LABPT, INR in the last 72 hours.  Exam: General - Patient is Alert and Oriented Extremity - Neurologically intact Neurovascular intact Sensation intact distally Dorsiflexion/Plantar flexion intact Dressing/Incision - clean, dry, no drainage Motor Function - intact, moving foot and toes well on exam.   Past Medical History:  Diagnosis Date   Anxiety    Cataracts, bilateral    md just watching    Constipation    Depression    Elevated LFTs    history of, normal abdominal ultrasound, LFT's normal 01/2007   Gallbladder problem    History of chest pain    rulre out MI in 12/2006.   Hyperlipidemia    diet controlled   Hypertension     Obesity    Osteoarthritis of both knees 12/26/2007   Xray of knee 10/2007: Bilateral osteoarthritis, sent to PT but did not complete tx course.     Osteopenia 07/05/2015   Prediabetes 11/30/2015    Assessment/Plan: 2 Days Post-Op Procedure(s) (LRB): TOTAL KNEE ARTHROPLASTY (Right) Principal Problem:   Primary osteoarthritis of right knee  Estimated body mass index is 37.77 kg/m as calculated from the following:   Height as of this encounter: 4\' 11"  (1.499 m).   Weight as of this encounter: 84.8 kg. Up with therapy  DVT Prophylaxis - Aspirin Weight-bearing as tolerated  Plan for discharge to home with OPPT.  Possible discharge this afternoon if meeting goals, otherwise will stay until tomorrow.   , PA-C Orthopedic Surgery [phone] 10/20/2021, 8:31 AM

## 2021-10-20 NOTE — Progress Notes (Signed)
Physical Therapy Treatment Patient Details Name: Darlene [name] MRN: [id number] DOB: [birth year] Today's Date: 10/20/2021   History of Present Illness Patient is 71 y.o. female s/p Rt TKA on 10/18/21 with PMH significant for anxiety, depression, HTN, HLD, OA, osteopenia, Lt TKA in 2021.    PT Comments    Patient making some progress this session and ambulated ~2x20' with RW and min assist. Pt requires max encouragement and continues to c/o dizziness and fatigue. BP stable and no LOB noted but pt moving at very slow pace and stopping to rest after several small steps. Will continue to progress pt as able during stay.    Recommendations for follow up therapy are one component of a multi-disciplinary discharge planning process, led by the attending physician.  Recommendations may be updated based on patient status, additional functional criteria and insurance authorization.  Follow Up Recommendations  Skilled nursing-short term rehab (<3 hours/day)     Assistance Recommended at Discharge Frequent or constant Supervision/Assistance  Patient can return home with the following A little help with walking and/or transfers;A little help with bathing/dressing/bathroom;Assistance with cooking/housework;Help with stairs or ramp for entrance;Assist for transportation   Equipment Recommendations  None recommended by PT    Recommendations for Other Services       Precautions / Restrictions Precautions Precautions: Fall Restrictions Weight Bearing Restrictions: Yes RLE Weight Bearing: Weight bearing as tolerated     Mobility  Bed Mobility               General bed mobility comments: pt OOB to recliner    Transfers Overall transfer level: Needs assistance Equipment used: Rolling walker (2 wheels) Transfers: Sit to/from Stand, Bed to chair/wheelchair/BSC     Step pivot transfers: Min assist       General transfer comment: pt slow to initiate and cues for hand placement to  power up with bil UE's from chair required. min assist to steady and complete rise.    Ambulation/Gait Ambulation/Gait assistance: Min assist Gait Distance (Feet): 40 Feet (2x20) Assistive device: Rolling walker (2 wheels) Gait Pattern/deviations: Step-to pattern, Decreased stride length, Decreased weight shift to right, Decreased stance time - right Gait velocity: decr     General Gait Details: pt requires max encouragement to progress gait and took multiple stopping and standing rest breaks. pt c/o feeling like she was going to fall over while standing still despite no evidence of balance instability. pt c/o dizziness and BP stable, gait continued after seated rest.   Stairs             Wheelchair Mobility    Modified Rankin (Stroke Patients Only)       Balance Overall balance assessment: Needs assistance Sitting-balance support: Feet supported Sitting balance-Leahy Scale: Fair     Standing balance support: Reliant on assistive device for balance, During functional activity, Bilateral upper extremity supported Standing balance-Leahy Scale: Poor                              Cognition Arousal/Alertness: Awake/alert Behavior During Therapy: WFL for tasks assessed/performed Overall Cognitive Status: Within Functional Limits for tasks assessed                                          Exercises Total Joint Exercises Ankle Circles/Pumps: AROM, Both, Seated, 10 reps Quad Sets: AROM, Both, Seated,  10 reps Heel Slides: Both, Seated, 10 reps, AAROM    General Comments        Pertinent Vitals/Pain Pain Assessment Pain Assessment: Faces Faces Pain Scale: Hurts even more Pain Location: Rt knee Pain Descriptors / Indicators: Aching, Discomfort, Grimacing Pain Intervention(s): Monitored during session, Limited activity within patient's tolerance, Repositioned, Premedicated before session, Ice applied    Home Living                           Prior Function            PT Goals (current goals can now be found in the care plan section) Acute Rehab PT Goals Patient Stated Goal: stop hurting and get independent again PT Goal Formulation: With patient Time For Goal Achievement: 10/25/21 Potential to Achieve Goals: Good Progress towards PT goals: Progressing toward goals    Frequency    7X/week      PT Plan Current plan remains appropriate    Co-evaluation              AM-PAC PT "6 Clicks" Mobility   Outcome Measure  Help needed turning from your back to your side while in a flat bed without using bedrails?: A Little Help needed moving from lying on your back to sitting on the side of a flat bed without using bedrails?: A Little Help needed moving to and from a bed to a chair (including a wheelchair)?: A Little Help needed standing up from a chair using your arms (e.g., wheelchair or bedside chair)?: A Little Help needed to walk in hospital room?: A Little Help needed climbing 3-5 steps with a railing? : A Lot 6 Click Score: 17    End of Session Equipment Utilized During Treatment: Gait belt Activity Tolerance: Patient tolerated treatment well Patient left: in chair;with call bell/phone within reach;with chair alarm set;with family/visitor present Nurse Communication: Mobility status;Patient requests pain meds PT Visit Diagnosis: Muscle weakness (generalized) (M62.81);Difficulty in walking, not elsewhere classified (R26.2)     Time: 7001-7494 PT Time Calculation (min) (ACUTE ONLY): 23 min  Charges:  $Gait Training: 8-22 mins $Therapeutic Exercise: 8-22 mins                     Wynn Maudlin, DPT Acute Rehabilitation Services Office [phone] Pager [phone]    Anitra Lauth 10/20/2021, 12:27 PM

## 2021-10-21 ENCOUNTER — Other Ambulatory Visit (INDEPENDENT_AMBULATORY_CARE_PROVIDER_SITE_OTHER): Payer: Self-pay | Admitting: Family Medicine

## 2021-10-21 DIAGNOSIS — I10 Essential (primary) hypertension: Secondary | ICD-10-CM | POA: Diagnosis not present

## 2021-10-21 DIAGNOSIS — R7303 Prediabetes: Secondary | ICD-10-CM | POA: Diagnosis not present

## 2021-10-21 DIAGNOSIS — M1711 Unilateral primary osteoarthritis, right knee: Secondary | ICD-10-CM | POA: Diagnosis not present

## 2021-10-21 DIAGNOSIS — Z96652 Presence of left artificial knee joint: Secondary | ICD-10-CM | POA: Diagnosis not present

## 2021-10-21 LAB — CBC
HCT: 31.2 % — ABNORMAL LOW (ref 36.0–46.0)
Hemoglobin: 9.9 g/dL — ABNORMAL LOW (ref 12.0–15.0)
MCH: 27.9 pg (ref 26.0–34.0)
MCHC: 31.7 g/dL (ref 30.0–36.0)
MCV: 87.9 fL (ref 80.0–100.0)
Platelets: 232 10*3/uL (ref 150–400)
RBC: 3.55 MIL/uL — ABNORMAL LOW (ref 3.87–5.11)
RDW: 13.5 % (ref 11.5–15.5)
WBC: 9.7 10*3/uL (ref 4.0–10.5)
nRBC: 0 % (ref 0.0–0.2)

## 2021-10-21 NOTE — Progress Notes (Signed)
Physical Therapy Treatment Patient Details Name: Darlene [name] MRN: [id number] DOB: 24-Jan-1951 Today's Date: 10/21/2021   History of Present Illness Patient is 71 y.o. female s/p Rt TKA on 10/18/21 with PMH significant for anxiety, depression, HTN, HLD, OA, osteopenia, Lt TKA in 2021.    PT Comments    Patient continues to make good progress with mobility and increased distance for gait to ~85' with RW and supervision/guarding only. No cues needed for walker management with turns during gait. Pt demonstrates improved ability to power up and control lowering with transfers throughout session. Pt fatigued by end of gait and required extended rest. Patient educated on supine exercises for Rt knee ROM, strength, and circulation. Pt quad strength limited by pain and unable to complete SAQ or progress to LAQ and SLR. Patient is now mobilizing at safe level for return home with assist from family and pt reports her sister will be there to help her initially. Will continue to progress as pt is able.    Recommendations for follow up therapy are one component of a multi-disciplinary discharge planning process, led by the attending physician.  Recommendations may be updated based on patient status, additional functional criteria and insurance authorization.  Follow Up Recommendations  Home health PT     Assistance Recommended at Discharge Frequent or constant Supervision/Assistance  Patient can return home with the following A little help with walking and/or transfers;A little help with bathing/dressing/bathroom;Assistance with cooking/housework;Help with stairs or ramp for entrance;Assist for transportation   Equipment Recommendations  None recommended by PT    Recommendations for Other Services       Precautions / Restrictions Precautions Precautions: Fall Restrictions Weight Bearing Restrictions: No RLE Weight Bearing: Weight bearing as tolerated     Mobility  Bed Mobility                General bed mobility comments: pt OOB to recliner    Transfers Overall transfer level: Needs assistance Equipment used: Rolling walker (2 wheels) Transfers: Sit to/from Stand, Bed to chair/wheelchair/BSC Sit to Stand: Supervision           General transfer comment: pt slow with transfer but steady. good recall for hand placement to power up and fro reach abck and Rt knee extension to sit. supervision for safety.    Ambulation/Gait Ambulation/Gait assistance: Min guard, Supervision Gait Distance (Feet): 85 Feet Assistive device: Rolling walker (2 wheels) Gait Pattern/deviations: Step-to pattern, Decreased stride length, Decreased weight shift to right, Decreased stance time - right Gait velocity: decr     General Gait Details: pt with improved pace during gait and maintained safe proximity to RW and upright posture. no LOB noted throughout. guarding/supervision for safety.   Stairs             Wheelchair Mobility    Modified Rankin (Stroke Patients Only)       Balance Overall balance assessment: Needs assistance Sitting-balance support: Feet supported Sitting balance-Leahy Scale: Fair     Standing balance support: Reliant on assistive device for balance, During functional activity, Bilateral upper extremity supported Standing balance-Leahy Scale: Poor                              Cognition Arousal/Alertness: Awake/alert Behavior During Therapy: WFL for tasks assessed/performed Overall Cognitive Status: Within Functional Limits for tasks assessed  Exercises Total Joint Exercises Ankle Circles/Pumps: AROM, Both, Seated, 10 reps Quad Sets: AROM, Right, 10 reps, Seated Short Arc Quad: AAROM, Right, Seated (4) Heel Slides: AROM, Right, 10 reps, Seated Hip ABduction/ADduction: AROM, Right, 10 reps, Seated    General Comments        Pertinent Vitals/Pain Pain  Assessment Pain Assessment: 0-10 Pain Score: 5  Pain Location: Rt knee Pain Descriptors / Indicators: Aching, Discomfort, Grimacing Pain Intervention(s): Limited activity within patient's tolerance, Monitored during session, Repositioned    Home Living                          Prior Function            PT Goals (current goals can now be found in the care plan section) Acute Rehab PT Goals Patient Stated Goal: stop hurting and get independent again PT Goal Formulation: With patient Time For Goal Achievement: 10/25/21 Potential to Achieve Goals: Good Progress towards PT goals: Progressing toward goals    Frequency    7X/week      PT Plan Current plan remains appropriate    Co-evaluation              AM-PAC PT "6 Clicks" Mobility   Outcome Measure  Help needed turning from your back to your side while in a flat bed without using bedrails?: A Little Help needed moving from lying on your back to sitting on the side of a flat bed without using bedrails?: A Little Help needed moving to and from a bed to a chair (including a wheelchair)?: A Little Help needed standing up from a chair using your arms (e.g., wheelchair or bedside chair)?: A Little Help needed to walk in hospital room?: A Little Help needed climbing 3-5 steps with a railing? : A Lot 6 Click Score: 17    End of Session Equipment Utilized During Treatment: Gait belt Activity Tolerance: Patient tolerated treatment well Patient left: in chair;with call bell/phone within reach;with chair alarm set;with family/visitor present Nurse Communication: Mobility status;Patient requests pain meds PT Visit Diagnosis: Muscle weakness (generalized) (M62.81);Difficulty in walking, not elsewhere classified (R26.2)     Time: 5885-0277 PT Time Calculation (min) (ACUTE ONLY): 33 min  Charges:  $Gait Training: 8-22 mins $Therapeutic Exercise: 8-22 mins                     Wynn Maudlin, DPT Acute  Rehabilitation Services Office [phone] Pager [phone]    Anitra Lauth 10/21/2021, 9:22 AM

## 2021-10-21 NOTE — Progress Notes (Signed)
Discharge teaching and paperwork provided.  Explained medications and when to take next dose.  IV removed.  Patient wheeled down via wheel chair with all belongings.  Care relinquished.

## 2021-10-21 NOTE — Progress Notes (Addendum)
° °  Subjective: 3 Days Post-Op Procedure(s) (LRB): TOTAL KNEE ARTHROPLASTY (Right) Patient reports pain as mild.   Patient seen in rounds by Dr. Lequita Halt. Patient is well, and has had no acute complaints or problems. Denies SOB, chest pain, or calf pain. No acute overnight events. Ambulated 60 feet with therapy yesterday. Will continue therapy today.   Plan is to go Home after hospital stay.  Objective: Vital signs in last 24 hours: Temp:  [97.4 F (36.3 C)-98.1 F (36.7 C)] 98.1 F (36.7 C) (01/26 0610) Pulse Rate:  [77-86] 78 (01/26 0610) Resp:  [15-17] 16 (01/26 0610) BP: (128-158)/(56-90) 130/56 (01/26 0610) SpO2:  [100 %] 100 % (01/26 0610)  Intake/Output from previous day:  Intake/Output Summary (Last 24 hours) at 10/21/2021 0752 Last data filed at 10/21/2021 0600 Gross per 24 hour  Intake 510 ml  Output --  Net 510 ml    Intake/Output this shift: No intake/output data recorded.  Labs: Recent Labs    10/19/21 0340 10/20/21 0331 10/21/21 0315  HGB 11.3* 10.9* 9.9*   Recent Labs    10/20/21 0331 10/21/21 0315  WBC 10.5 9.7  RBC 3.96 3.55*  HCT 34.8* 31.2*  PLT 259 232   Recent Labs    10/19/21 0340 10/20/21 0331  NA 135 136  K 3.7 4.2  CL 103 104  CO2 25 26  BUN 13 11  CREATININE 0.67 0.66  GLUCOSE 118* 120*  CALCIUM 8.6* 8.8*   No results for input(s): LABPT, INR in the last 72 hours.  Exam: General - Patient is Alert and Oriented Extremity - Neurologically intact Neurovascular intact Sensation intact distally Dorsiflexion/Plantar flexion intact Dressing/Incision - clean, dry, no drainage Motor Function - intact, moving foot and toes well on exam.   Past Medical History:  Diagnosis Date   Anxiety    Cataracts, bilateral    md just watching    Constipation    Depression    Elevated LFTs    history of, normal abdominal ultrasound, LFT's normal 01/2007   Gallbladder problem    History of chest pain    rulre out MI in 12/2006.    Hyperlipidemia    diet controlled   Hypertension    Obesity    Osteoarthritis of both knees 12/26/2007   Xray of knee 10/2007: Bilateral osteoarthritis, sent to PT but did not complete tx course.     Osteopenia 07/05/2015   Prediabetes 11/30/2015    Assessment/Plan: 3 Days Post-Op Procedure(s) (LRB): TOTAL KNEE ARTHROPLASTY (Right) Principal Problem:   Primary osteoarthritis of right knee  Estimated body mass index is 37.77 kg/m as calculated from the following:   Height as of this encounter: 4\' 11"  (1.499 m).   Weight as of this encounter: 84.8 kg. Up with therapy  DVT Prophylaxis - Aspirin and TED hose Weight-bearing as tolerated.  Plan for two sessions with PT this morning, and if meeting goals, will plan for discharge this afternoon.   Patient to follow up in two weeks with Dr. in clinic.   The PDMP database was reviewed today prior to any opioid medications being prescribed to this patient.Lequita Halt, MBA, PA-C Orthopedic Surgery [phone] 10/21/2021, 7:52 AM

## 2021-10-25 DIAGNOSIS — M25561 Pain in right knee: Secondary | ICD-10-CM | POA: Diagnosis not present

## 2021-10-25 DIAGNOSIS — M25669 Stiffness of unspecified knee, not elsewhere classified: Secondary | ICD-10-CM | POA: Insufficient documentation

## 2021-10-25 DIAGNOSIS — M25661 Stiffness of right knee, not elsewhere classified: Secondary | ICD-10-CM | POA: Diagnosis not present

## 2021-10-25 NOTE — Discharge Summary (Signed)
Patient ID: Darlene HuggerBarbara Izetta [name] MRN: [id number] DOB/AGE: 1951/06/06 71 y.o.  Admit date: 10/18/2021 Discharge date: 10/21/2021  Admission Diagnoses:  Principal Problem:   Primary osteoarthritis of right knee   Discharge Diagnoses:  Same  Past Medical History:  Diagnosis Date   Anxiety    Cataracts, bilateral    md just watching    Constipation    Depression    Elevated LFTs    history of, normal abdominal ultrasound, LFT's normal 01/2007   Gallbladder problem    History of chest pain    rulre out MI in 12/2006.   Hyperlipidemia    diet controlled   Hypertension    Obesity    Osteoarthritis of both knees 12/26/2007   Xray of knee 10/2007: Bilateral osteoarthritis, sent to PT but did not complete tx course.     Osteopenia 07/05/2015   Prediabetes 11/30/2015    Surgeries: Procedure(s): TOTAL KNEE ARTHROPLASTY on 10/18/2021   Consultants:   Discharged Condition: Improved  Hospital Course: Darlene MarusBarbara Izetta [name] is an 71 y.o. female who was admitted 10/18/2021 for operative treatment ofPrimary osteoarthritis of right knee. Patient has severe unremitting pain that affects sleep, daily activities, and work/hobbies. After pre-op clearance the patient was taken to the operating room on 10/18/2021 and underwent  Procedure(s): TOTAL KNEE ARTHROPLASTY.    Patient was given perioperative antibiotics:  Anti-infectives (From admission, onward)    Start     Dose/Rate Route Frequency Ordered Stop   10/18/21 1630  ceFAZolin (ANCEF) IVPB 2g/100 mL premix        2 g 200 mL/hr over 30 Minutes Intravenous Every 6 hours 10/18/21 1451 10/18/21 2213   10/18/21 0815  ceFAZolin (ANCEF) IVPB 2g/100 mL premix        2 g 200 mL/hr over 30 Minutes Intravenous On call to O.R. 10/18/21 [id number] 10/18/21 1054        Patient was given sequential compression devices, early ambulation, and chemoprophylaxis to prevent DVT.  Patient benefited maximally from hospital stay and there were no complications.     Recent vital signs: No data found.   Recent laboratory studies: No results for input(s): WBC, HGB, HCT, PLT, NA, K, CL, CO2, BUN, CREATININE, GLUCOSE, INR, CALCIUM in the last 72 hours.  Invalid input(s): PT, 2   Discharge Medications:   Allergies as of 10/21/2021       Reactions   Trazodone And Nefazodone Other (See Comments)   Bad headaches   Codeine Nausea Only   Propoxyphene N-acetaminophen    "makes me sick"        Medication List     STOP taking these medications    Biofreeze 4 % Gel Generic drug: Menthol (Topical Analgesic)   Tavaborole 5 % Soln Commonly known as: Kerydin       TAKE these medications    acetaminophen 500 MG tablet Commonly known as: TYLENOL Take 1,000 mg by mouth every 8 (eight) hours as needed for moderate pain.   aspirin 325 MG EC tablet Take 1 tablet (325 mg total) by mouth 2 (two) times daily for 20 days. Then take one 81 mg aspirin once a day for three weeks. Then discontinue aspirin.   Efinaconazole 10 % Soln Apply 1 drop topically daily. What changed: when to take this   gabapentin 300 MG capsule Commonly known as: NEURONTIN Take a 300 mg capsule three times a day for two weeks following surgery.Then take a 300 mg capsule two times a day for two weeks. Then take  a 300 mg capsule once a day for two weeks. Then discontinue.   HAIR SKIN & NAILS GUMMIES PO Take 2 capsules by mouth every other day.   hydrochlorothiazide 12.5 MG tablet Commonly known as: HYDRODIURIL Take 12.5 mg by mouth in the morning.   HYDROcodone-acetaminophen 5-325 MG tablet Commonly known as: NORCO/VICODIN Take 1-2 tablets by mouth every 6 (six) hours as needed for severe pain.   Magnesium 400 MG Tabs Take 400 mg by mouth in the morning.   methocarbamol 500 MG tablet Commonly known as: ROBAXIN Take 1 tablet (500 mg total) by mouth every 6 (six) hours as needed for muscle spasms.   Ozempic (1 MG/DOSE) 4 MG/3ML Sopn Generic drug: Semaglutide (1  MG/DOSE) Inject 1 mg into the skin once a week. What changed: when to take this   potassium chloride 10 MEQ tablet Commonly known as: KLOR-CON Take 10 mEq by mouth every evening.   pravastatin 10 MG tablet Commonly known as: PRAVACHOL Take 1 tablet (10 mg total) by mouth daily. What changed: when to take this   traMADol 50 MG tablet Commonly known as: ULTRAM Take 1-2 tablets (50-100 mg total) by mouth every 6 (six) hours as needed for moderate pain.   vitamin B-12 1000 MCG tablet Commonly known as: CYANOCOBALAMIN Take 1,000 mcg by mouth every other day. In the morning   Vitamin D (Ergocalciferol) 1.25 MG (50000 UNIT) Caps capsule Commonly known as: DRISDOL Take 1 capsule (50,000 Units total) by mouth every 7 (seven) days. What changed:  when to take this additional instructions               Discharge Care Instructions  (From admission, onward)           Start     Ordered   10/21/21 0000  Weight bearing as tolerated        10/21/21 0756   10/21/21 0000  Change dressing       Comments: You may remove the bulky bandage (ACE wrap and gauze) two days after surgery. You will have an adhesive waterproof bandage underneath. Leave this in place until your first follow-up appointment.   10/21/21 0756   10/19/21 0000  Weight bearing as tolerated        10/19/21 0806   10/19/21 0000  Change dressing       Comments: You may remove the bulky bandage (ACE wrap and gauze) two days after surgery. You will have an adhesive waterproof bandage underneath. Leave this in place until your first follow-up appointment.   10/19/21 0806            Diagnostic Studies: No results found.  Disposition: Discharge disposition: 01-Home or Self Care       Discharge Instructions     Call MD / Call 911   Complete by: As directed    If you experience chest pain or shortness of breath, CALL 911 and be transported to the hospital emergency room.  If you develope a fever above 101  F, pus (white drainage) or increased drainage or redness at the wound, or calf pain, call your surgeon's office.   Call MD / Call 911   Complete by: As directed    If you experience chest pain or shortness of breath, CALL 911 and be transported to the hospital emergency room.  If you develope a fever above 101 F, pus (white drainage) or increased drainage or redness at the wound, or calf pain, call your surgeon's office.  Change dressing   Complete by: As directed    You may remove the bulky bandage (ACE wrap and gauze) two days after surgery. You will have an adhesive waterproof bandage underneath. Leave this in place until your first follow-up appointment.   Change dressing   Complete by: As directed    You may remove the bulky bandage (ACE wrap and gauze) two days after surgery. You will have an adhesive waterproof bandage underneath. Leave this in place until your first follow-up appointment.   Constipation Prevention   Complete by: As directed    Drink plenty of fluids.  Prune juice may be helpful.  You may use a stool softener, such as Colace (over the counter) 100 mg twice a day.  Use MiraLax (over the counter) for constipation as needed.   Constipation Prevention   Complete by: As directed    Drink plenty of fluids.  Prune juice may be helpful.  You may use a stool softener, such as Colace (over the counter) 100 mg twice a day.  Use MiraLax (over the counter) for constipation as needed.   Diet - low sodium heart healthy   Complete by: As directed    Diet - low sodium heart healthy   Complete by: As directed    Do not put a pillow under the knee. Place it under the heel.   Complete by: As directed    Do not put a pillow under the knee. Place it under the heel.   Complete by: As directed    Driving restrictions   Complete by: As directed    No driving for two weeks   Driving restrictions   Complete by: As directed    No driving for two weeks   Post-operative opioid taper  instructions:   Complete by: As directed    POST-OPERATIVE OPIOID TAPER INSTRUCTIONS: It is important to wean off of your opioid medication as soon as possible. If you do not need pain medication after your surgery it is ok to stop day one. Opioids include: Codeine, Hydrocodone(Norco, Vicodin), Oxycodone(Percocet, oxycontin) and hydromorphone amongst others.  Long term and even short term use of opiods can cause: Increased pain response Dependence Constipation Depression Respiratory depression And more.  Withdrawal symptoms can include Flu like symptoms Nausea, vomiting And more Techniques to manage these symptoms Hydrate well Eat regular healthy meals Stay active Use relaxation techniques(deep breathing, meditating, yoga) Do Not substitute Alcohol to help with tapering If you have been on opioids for less than two weeks and do not have pain than it is ok to stop all together.  Plan to wean off of opioids This plan should start within one week post op of your joint replacement. Maintain the same interval or time between taking each dose and first decrease the dose.  Cut the total daily intake of opioids by one tablet each day Next start to increase the time between doses. The last dose that should be eliminated is the evening dose.      Post-operative opioid taper instructions:   Complete by: As directed    POST-OPERATIVE OPIOID TAPER INSTRUCTIONS: It is important to wean off of your opioid medication as soon as possible. If you do not need pain medication after your surgery it is ok to stop day one. Opioids include: Codeine, Hydrocodone(Norco, Vicodin), Oxycodone(Percocet, oxycontin) and hydromorphone amongst others.  Long term and even short term use of opiods can cause: Increased pain response Dependence Constipation Depression Respiratory depression And more.  Withdrawal  symptoms can include Flu like symptoms Nausea, vomiting And more Techniques to manage these  symptoms Hydrate well Eat regular healthy meals Stay active Use relaxation techniques(deep breathing, meditating, yoga) Do Not substitute Alcohol to help with tapering If you have been on opioids for less than two weeks and do not have pain than it is ok to stop all together.  Plan to wean off of opioids This plan should start within one week post op of your joint replacement. Maintain the same interval or time between taking each dose and first decrease the dose.  Cut the total daily intake of opioids by one tablet each day Next start to increase the time between doses. The last dose that should be eliminated is the evening dose.      TED hose   Complete by: As directed    Use stockings (TED hose) for three weeks on both leg(s).  You may remove them at night for sleeping.   TED hose   Complete by: As directed    Use stockings (TED hose) for three weeks on both leg(s).  You may remove them at night for sleeping.   Weight bearing as tolerated   Complete by: As directed    Weight bearing as tolerated   Complete by: As directed         Follow-up Information     Ollen Gross, MD. Go on 11/02/2021.   Specialty: Orthopedic Surgery Why: You are scheduled for first post op appointment on Tuesday February 7th at 2:15pm. Contact information: [address] [place] [address] [phone]                  Signed: Arther Abbott 10/25/2021, 2:36 PM

## 2021-10-27 DIAGNOSIS — M25561 Pain in right knee: Secondary | ICD-10-CM | POA: Diagnosis not present

## 2021-10-27 DIAGNOSIS — M25661 Stiffness of right knee, not elsewhere classified: Secondary | ICD-10-CM | POA: Diagnosis not present

## 2021-11-01 DIAGNOSIS — M25661 Stiffness of right knee, not elsewhere classified: Secondary | ICD-10-CM | POA: Diagnosis not present

## 2021-11-01 DIAGNOSIS — M25561 Pain in right knee: Secondary | ICD-10-CM | POA: Diagnosis not present

## 2021-11-03 ENCOUNTER — Ambulatory Visit (INDEPENDENT_AMBULATORY_CARE_PROVIDER_SITE_OTHER): Payer: Medicare HMO | Admitting: Family Medicine

## 2021-11-03 DIAGNOSIS — M25561 Pain in right knee: Secondary | ICD-10-CM | POA: Diagnosis not present

## 2021-11-03 DIAGNOSIS — M25661 Stiffness of right knee, not elsewhere classified: Secondary | ICD-10-CM | POA: Diagnosis not present

## 2021-11-10 DIAGNOSIS — M25561 Pain in right knee: Secondary | ICD-10-CM | POA: Diagnosis not present

## 2021-11-10 DIAGNOSIS — M25661 Stiffness of right knee, not elsewhere classified: Secondary | ICD-10-CM | POA: Diagnosis not present

## 2021-11-15 ENCOUNTER — Other Ambulatory Visit (INDEPENDENT_AMBULATORY_CARE_PROVIDER_SITE_OTHER): Payer: Self-pay | Admitting: Family Medicine

## 2021-11-15 DIAGNOSIS — R7303 Prediabetes: Secondary | ICD-10-CM

## 2021-11-15 DIAGNOSIS — E559 Vitamin D deficiency, unspecified: Secondary | ICD-10-CM

## 2021-11-16 DIAGNOSIS — M25661 Stiffness of right knee, not elsewhere classified: Secondary | ICD-10-CM | POA: Diagnosis not present

## 2021-11-16 DIAGNOSIS — M25561 Pain in right knee: Secondary | ICD-10-CM | POA: Diagnosis not present

## 2021-11-18 DIAGNOSIS — M25561 Pain in right knee: Secondary | ICD-10-CM | POA: Diagnosis not present

## 2021-11-18 DIAGNOSIS — M25661 Stiffness of right knee, not elsewhere classified: Secondary | ICD-10-CM | POA: Diagnosis not present

## 2021-11-22 DIAGNOSIS — M25561 Pain in right knee: Secondary | ICD-10-CM | POA: Diagnosis not present

## 2021-11-23 DIAGNOSIS — Z4789 Encounter for other orthopedic aftercare: Secondary | ICD-10-CM | POA: Diagnosis not present

## 2021-11-24 DIAGNOSIS — M25561 Pain in right knee: Secondary | ICD-10-CM | POA: Diagnosis not present

## 2021-11-30 ENCOUNTER — Encounter (INDEPENDENT_AMBULATORY_CARE_PROVIDER_SITE_OTHER): Payer: Self-pay

## 2021-11-30 ENCOUNTER — Other Ambulatory Visit (INDEPENDENT_AMBULATORY_CARE_PROVIDER_SITE_OTHER): Payer: Self-pay | Admitting: Family Medicine

## 2021-11-30 ENCOUNTER — Ambulatory Visit (INDEPENDENT_AMBULATORY_CARE_PROVIDER_SITE_OTHER): Payer: Medicare HMO | Admitting: Adult Health

## 2021-11-30 DIAGNOSIS — M25661 Stiffness of right knee, not elsewhere classified: Secondary | ICD-10-CM | POA: Diagnosis not present

## 2021-11-30 DIAGNOSIS — R7303 Prediabetes: Secondary | ICD-10-CM

## 2021-11-30 DIAGNOSIS — M25561 Pain in right knee: Secondary | ICD-10-CM | POA: Diagnosis not present

## 2021-12-01 MED ORDER — OZEMPIC (1 MG/DOSE) 4 MG/3ML ~~LOC~~ SOPN: 1.0000 mg | PEN_INJECTOR | SUBCUTANEOUS | 0 refills | Status: DC

## 2021-12-01 NOTE — Telephone Encounter (Signed)
Refill request

## 2021-12-02 DIAGNOSIS — M25561 Pain in right knee: Secondary | ICD-10-CM | POA: Diagnosis not present

## 2021-12-07 DIAGNOSIS — M25661 Stiffness of right knee, not elsewhere classified: Secondary | ICD-10-CM | POA: Diagnosis not present

## 2021-12-07 DIAGNOSIS — M25561 Pain in right knee: Secondary | ICD-10-CM | POA: Diagnosis not present

## 2021-12-09 DIAGNOSIS — M170 Bilateral primary osteoarthritis of knee: Secondary | ICD-10-CM | POA: Diagnosis not present

## 2021-12-09 DIAGNOSIS — R519 Headache, unspecified: Secondary | ICD-10-CM | POA: Diagnosis not present

## 2021-12-09 DIAGNOSIS — M25661 Stiffness of right knee, not elsewhere classified: Secondary | ICD-10-CM | POA: Diagnosis not present

## 2021-12-09 DIAGNOSIS — M79642 Pain in left hand: Secondary | ICD-10-CM | POA: Diagnosis not present

## 2021-12-09 DIAGNOSIS — R7303 Prediabetes: Secondary | ICD-10-CM | POA: Diagnosis not present

## 2021-12-09 DIAGNOSIS — M25561 Pain in right knee: Secondary | ICD-10-CM | POA: Diagnosis not present

## 2021-12-09 DIAGNOSIS — S93601A Unspecified sprain of right foot, initial encounter: Secondary | ICD-10-CM | POA: Diagnosis not present

## 2021-12-09 DIAGNOSIS — Z0000 Encounter for general adult medical examination without abnormal findings: Secondary | ICD-10-CM | POA: Diagnosis not present

## 2021-12-09 DIAGNOSIS — M5090 Cervical disc disorder, unspecified, unspecified cervical region: Secondary | ICD-10-CM | POA: Diagnosis not present

## 2021-12-09 DIAGNOSIS — E785 Hyperlipidemia, unspecified: Secondary | ICD-10-CM | POA: Diagnosis not present

## 2021-12-09 DIAGNOSIS — Z1231 Encounter for screening mammogram for malignant neoplasm of breast: Secondary | ICD-10-CM | POA: Diagnosis not present

## 2021-12-09 DIAGNOSIS — I10 Essential (primary) hypertension: Secondary | ICD-10-CM | POA: Diagnosis not present

## 2021-12-09 DIAGNOSIS — E669 Obesity, unspecified: Secondary | ICD-10-CM | POA: Diagnosis not present

## 2021-12-09 DIAGNOSIS — M8580 Other specified disorders of bone density and structure, unspecified site: Secondary | ICD-10-CM | POA: Diagnosis not present

## 2021-12-09 DIAGNOSIS — F4310 Post-traumatic stress disorder, unspecified: Secondary | ICD-10-CM | POA: Diagnosis not present

## 2021-12-09 DIAGNOSIS — R438 Other disturbances of smell and taste: Secondary | ICD-10-CM | POA: Diagnosis not present

## 2021-12-09 DIAGNOSIS — M545 Low back pain, unspecified: Secondary | ICD-10-CM | POA: Diagnosis not present

## 2021-12-09 DIAGNOSIS — L309 Dermatitis, unspecified: Secondary | ICD-10-CM | POA: Diagnosis not present

## 2021-12-09 DIAGNOSIS — E2839 Other primary ovarian failure: Secondary | ICD-10-CM | POA: Diagnosis not present

## 2021-12-09 DIAGNOSIS — S069X1A Unspecified intracranial injury with loss of consciousness of 30 minutes or less, initial encounter: Secondary | ICD-10-CM | POA: Diagnosis not present

## 2021-12-09 DIAGNOSIS — B353 Tinea pedis: Secondary | ICD-10-CM | POA: Diagnosis not present

## 2021-12-09 DIAGNOSIS — R430 Anosmia: Secondary | ICD-10-CM | POA: Diagnosis not present

## 2021-12-09 DIAGNOSIS — M25512 Pain in left shoulder: Secondary | ICD-10-CM | POA: Diagnosis not present

## 2021-12-09 DIAGNOSIS — Z01818 Encounter for other preprocedural examination: Secondary | ICD-10-CM | POA: Diagnosis not present

## 2021-12-09 DIAGNOSIS — K5900 Constipation, unspecified: Secondary | ICD-10-CM | POA: Diagnosis not present

## 2021-12-09 DIAGNOSIS — Z96651 Presence of right artificial knee joint: Secondary | ICD-10-CM | POA: Diagnosis not present

## 2021-12-09 DIAGNOSIS — I951 Orthostatic hypotension: Secondary | ICD-10-CM | POA: Diagnosis not present

## 2021-12-09 DIAGNOSIS — Z7189 Other specified counseling: Secondary | ICD-10-CM | POA: Diagnosis not present

## 2021-12-09 DIAGNOSIS — Z96659 Presence of unspecified artificial knee joint: Secondary | ICD-10-CM | POA: Diagnosis not present

## 2021-12-09 DIAGNOSIS — Z87820 Personal history of traumatic brain injury: Secondary | ICD-10-CM | POA: Diagnosis not present

## 2021-12-09 DIAGNOSIS — Z96652 Presence of left artificial knee joint: Secondary | ICD-10-CM | POA: Diagnosis not present

## 2021-12-09 DIAGNOSIS — R413 Other amnesia: Secondary | ICD-10-CM | POA: Diagnosis not present

## 2021-12-09 DIAGNOSIS — M542 Cervicalgia: Secondary | ICD-10-CM | POA: Diagnosis not present

## 2021-12-09 DIAGNOSIS — E559 Vitamin D deficiency, unspecified: Secondary | ICD-10-CM | POA: Diagnosis not present

## 2021-12-09 DIAGNOSIS — F339 Major depressive disorder, recurrent, unspecified: Secondary | ICD-10-CM | POA: Diagnosis not present

## 2021-12-09 DIAGNOSIS — Z1211 Encounter for screening for malignant neoplasm of colon: Secondary | ICD-10-CM | POA: Diagnosis not present

## 2021-12-09 DIAGNOSIS — S86309A Unspecified injury of muscle(s) and tendon(s) of peroneal muscle group at lower leg level, unspecified leg, initial encounter: Secondary | ICD-10-CM | POA: Diagnosis not present

## 2021-12-09 DIAGNOSIS — B351 Tinea unguium: Secondary | ICD-10-CM | POA: Diagnosis not present

## 2021-12-09 DIAGNOSIS — M66879 Spontaneous rupture of other tendons, unspecified ankle and foot: Secondary | ICD-10-CM | POA: Diagnosis not present

## 2021-12-09 DIAGNOSIS — G4700 Insomnia, unspecified: Secondary | ICD-10-CM | POA: Diagnosis not present

## 2021-12-09 DIAGNOSIS — R1310 Dysphagia, unspecified: Secondary | ICD-10-CM | POA: Diagnosis not present

## 2021-12-09 DIAGNOSIS — J0100 Acute maxillary sinusitis, unspecified: Secondary | ICD-10-CM | POA: Diagnosis not present

## 2021-12-09 DIAGNOSIS — S99912A Unspecified injury of left ankle, initial encounter: Secondary | ICD-10-CM | POA: Diagnosis not present

## 2021-12-09 DIAGNOSIS — Z136 Encounter for screening for cardiovascular disorders: Secondary | ICD-10-CM | POA: Diagnosis not present

## 2021-12-09 DIAGNOSIS — E876 Hypokalemia: Secondary | ICD-10-CM | POA: Diagnosis not present

## 2021-12-09 DIAGNOSIS — D696 Thrombocytopenia, unspecified: Secondary | ICD-10-CM | POA: Diagnosis not present

## 2021-12-09 DIAGNOSIS — Z6831 Body mass index (BMI) 31.0-31.9, adult: Secondary | ICD-10-CM | POA: Diagnosis not present

## 2021-12-13 DIAGNOSIS — M25561 Pain in right knee: Secondary | ICD-10-CM | POA: Diagnosis not present

## 2021-12-13 DIAGNOSIS — M25661 Stiffness of right knee, not elsewhere classified: Secondary | ICD-10-CM | POA: Diagnosis not present

## 2021-12-14 ENCOUNTER — Other Ambulatory Visit: Payer: Self-pay

## 2021-12-14 ENCOUNTER — Ambulatory Visit (INDEPENDENT_AMBULATORY_CARE_PROVIDER_SITE_OTHER): Payer: Medicare HMO | Admitting: Nurse Practitioner

## 2021-12-14 ENCOUNTER — Encounter (INDEPENDENT_AMBULATORY_CARE_PROVIDER_SITE_OTHER): Payer: Self-pay | Admitting: Nurse Practitioner

## 2021-12-14 VITALS — BP 121/76 | HR 84 | Temp 97.8°F | Ht 59.0 in | Wt 182.0 lb

## 2021-12-14 DIAGNOSIS — R7303 Prediabetes: Secondary | ICD-10-CM | POA: Diagnosis not present

## 2021-12-14 DIAGNOSIS — E785 Hyperlipidemia, unspecified: Secondary | ICD-10-CM | POA: Diagnosis not present

## 2021-12-14 DIAGNOSIS — Z6836 Body mass index (BMI) 36.0-36.9, adult: Secondary | ICD-10-CM

## 2021-12-14 DIAGNOSIS — E669 Obesity, unspecified: Secondary | ICD-10-CM

## 2021-12-14 DIAGNOSIS — E559 Vitamin D deficiency, unspecified: Secondary | ICD-10-CM

## 2021-12-14 DIAGNOSIS — E66813 Obesity, class 3: Secondary | ICD-10-CM

## 2021-12-14 DIAGNOSIS — E7849 Other hyperlipidemia: Secondary | ICD-10-CM

## 2021-12-14 DIAGNOSIS — D649 Anemia, unspecified: Secondary | ICD-10-CM

## 2021-12-14 MED ORDER — PRAVASTATIN SODIUM 10 MG PO TABS: 10.0000 mg | ORAL_TABLET | Freq: Every day | ORAL | 0 refills | Status: DC

## 2021-12-14 MED ORDER — VITAMIN D (ERGOCALCIFEROL) 1.25 MG (50000 UNIT) PO CAPS: 50000.0000 [IU] | ORAL_CAPSULE | ORAL | 0 refills | Status: DC

## 2021-12-14 MED ORDER — OZEMPIC (0.25 OR 0.5 MG/DOSE) 2 MG/1.5ML ~~LOC~~ SOPN: 0.5000 mg | PEN_INJECTOR | SUBCUTANEOUS | 0 refills | Status: DC

## 2021-12-15 DIAGNOSIS — R21 Rash and other nonspecific skin eruption: Secondary | ICD-10-CM | POA: Diagnosis not present

## 2021-12-15 NOTE — Progress Notes (Signed)
? ? ? ?Chief Complaint:  ? ?OBESITY ?Darlene [name] is here to discuss her progress with her obesity treatment plan along with follow-up of her obesity related diagnoses. Darlene [name] is on keeping a food journal and adhering to recommended goals of [phone] calories and 80 grams of protein and states she is following her eating plan approximately 50% of the time. Darlene [name] states she is doing physical therapy, stretches, and squats for 30 minutes 3 times per week. ? ?Today's visit was #: 24 ?Starting weight: 231 lbs ?Starting date: 06/04/2019 ?Today's weight: 182 lbs ?Today's date: 12/14/2021 ?Total lbs lost to date: 49 lbs ?Total lbs lost since last in-office visit: 4 lbs ? ?Interim History: Darlene [name] has done well with her weight loss.  She had right total knee replacement on 10/18/2021.  She is going to PT two times per week.  She is walking with the aid of a cane.  She has been following [facility].  Denies hunger, rarely reports cravings.  She has been trying to eat more protein, but has not been able to eat all her protein due to feeling full faster since increasing Ozempic to 1mg   ? ?Subjective:  ? ?1. Pre-diabetes ?Last A1c was 5.8.  Taking Ozempic 1 mg.  Reports rare side effect of nausea and diarrhea.  Feeling full and not able to eat all of her protein and calories. ? ?2. Other hyperlipidemia ?Taking pravastatin 10 mg 1-2 times per week.  Denies side effects.  She took Crestor in the past and stopped due to side effects. ? ?3. Vitamin D deficiency ?She is currently taking prescription vitamin D 50,000 IU every 2 weeks. She denies nausea, vomiting or muscle weakness. ? ?4. Anemia, unspecified type ?Had surgery on 10/18/2021.  She is not currently on iron or multivitamin.  Reports rarely being fatigued. ? ?Assessment/Plan:  ? ?1. Pre-diabetes ?Continue Ozempic.  Decrease Ozempic to 0.5 mg due to early satiety.  Side effects discussed.  Will check labs at next visit.  RF Ozempic 0.5 mg X 1. ? ?- Semaglutide,0.25 or 0.5MG /DOS,  (OZEMPIC, 0.25 OR 0.5 MG/DOSE,) 2 MG/1.5ML SOPN; Inject 0.5 mg into the skin once a week.  Dispense: 1.5 mL; Refill: 0 ? ?2. Other hyperlipidemia ?Encouraged to take pravastatin on a daily basis.  Will check labs at next visit.  Refill pravastatin 10 mg #30 x 1.  Side effects discussed. ? ?- Refill pravastatin (PRAVACHOL) 10 MG tablet; Take 1 tablet (10 mg total) by mouth daily.  Dispense: 90 tablet; Refill: 0 ? ?3. Vitamin D deficiency ?Continue vitamin D 50,000 IU every 2 weeks.  Refill x1.  Side effects discussed.  Will recheck labs at next visit. ? ?- Refill Vitamin D, Ergocalciferol, (DRISDOL) 1.25 MG (50000 UNIT) CAPS capsule; Take 1 capsule (50,000 Units total) by mouth every 14 (fourteen) days. Saturdays.  Dispense: 4 capsule; Refill: 0 ? ?4. Anemia, unspecified type ?Offered to obtain labs today.  Will obtain labs at next visit. ? ?5. Obesity: Current BMI 36.8 ? ?Darlene [name] is currently in the action stage of change. As such, her goal is to continue with weight loss efforts. She has agreed to keeping a food journal and adhering to recommended goals of [phone] calories and 80+ grams of protein.  ? ?Exercise goals:  As is. ? ?Behavioral modification strategies: increasing lean protein intake, increasing water intake, and no skipping meals. ? ?Darlene [name] has agreed to follow-up with our clinic in 3 weeks. She was informed of the importance of frequent follow-up visits to maximize her  success with intensive lifestyle modifications for her multiple health conditions.  ? ?Objective:  ? ?Blood pressure 121/76, pulse 84, temperature 97.8 ?F (36.6 ?C), height 4\' 11"  (1.499 m), weight 182 lb (82.6 kg), SpO2 99 %. ?Body mass index is 36.76 kg/m?. ? ?General: Cooperative, alert, well developed, in no acute distress. ?HEENT: Conjunctivae and lids unremarkable. ?Cardiovascular: Regular rhythm.  ?Lungs: Normal work of breathing. ?Neurologic: No focal deficits.  ? ?Lab Results  ?Component Value Date  ? CREATININE 0.66  10/20/2021  ? BUN 11 10/20/2021  ? NA 136 10/20/2021  ? K 4.2 10/20/2021  ? CL 104 10/20/2021  ? CO2 26 10/20/2021  ? ?Lab Results  ?Component Value Date  ? ALT 13 10/07/2021  ? AST 18 10/07/2021  ? ALKPHOS 92 10/07/2021  ? BILITOT 0.5 10/07/2021  ? ?Lab Results  ?Component Value Date  ? HGBA1C 5.8 (H) 08/25/2021  ? HGBA1C 5.5 03/10/2021  ? HGBA1C 5.8 (H) 11/03/2020  ? HGBA1C 5.9 (H) 06/23/2020  ? HGBA1C 5.6 04/01/2020  ? ?Lab Results  ?Component Value Date  ? INSULIN 8.4 08/25/2021  ? INSULIN 5.8 03/10/2021  ? INSULIN 9.6 11/03/2020  ? INSULIN 10.5 06/23/2020  ? INSULIN 12.2 04/01/2020  ? ?Lab Results  ?Component Value Date  ? TSH 1.620 06/04/2019  ? ?Lab Results  ?Component Value Date  ? CHOL 236 (H) 08/25/2021  ? HDL 78 08/25/2021  ? LDLCALC 142 (H) 08/25/2021  ? TRIG 94 08/25/2021  ? CHOLHDL 2.2 03/10/2021  ? ?Lab Results  ?Component Value Date  ? VD25OH 77.4 08/25/2021  ? VD25OH 46.2 04/21/2021  ? VD25OH 46.0 11/03/2020  ? ?Lab Results  ?Component Value Date  ? WBC 9.7 10/21/2021  ? HGB 9.9 (L) 10/21/2021  ? HCT 31.2 (L) 10/21/2021  ? MCV 87.9 10/21/2021  ? PLT 232 10/21/2021  ? ?Obesity Behavioral Intervention:  ? ?Approximately 15 minutes were spent on the discussion below. ? ?ASK: ?We discussed the diagnosis of obesity with Darlene [name] today and Darlene [name] agreed to give [place] permission to discuss obesity behavioral modification therapy today. ? ?ASSESS: ?Darlene [name] has the diagnosis of obesity and her BMI today is 36.8. Darlene [name] is in the action stage of change.  ? ?ADVISE: ?Darlene [name] was educated on the multiple health risks of obesity as well as the benefit of weight loss to improve her health. She was advised of the need for long term treatment and the importance of lifestyle modifications to improve her current health and to decrease her risk of future health problems. ? ?AGREE: ?Multiple dietary modification options and treatment options were discussed and Darlene [name] agreed to follow the recommendations documented in  the above note. ? ?ARRANGE: ?Darlene [name] was educated on the importance of frequent visits to treat obesity as outlined per CMS and USPSTF guidelines and agreed to schedule her next follow up appointment today. ? ?Attestation Statements:  ? ?Reviewed by clinician on day of visit: allergies, medications, problem list, medical history, surgical history, family history, social history, and previous encounter notes. ? ?I, Water quality scientist, CMA, am acting as transcriptionist for Everardo Pacific, [place]. ? ?I have reviewed the above documentation for accuracy and completeness, and I agree with the above. Everardo Pacific, FNP  ?

## 2021-12-16 DIAGNOSIS — M25661 Stiffness of right knee, not elsewhere classified: Secondary | ICD-10-CM | POA: Diagnosis not present

## 2021-12-16 DIAGNOSIS — M25561 Pain in right knee: Secondary | ICD-10-CM | POA: Diagnosis not present

## 2021-12-21 DIAGNOSIS — M25561 Pain in right knee: Secondary | ICD-10-CM | POA: Diagnosis not present

## 2021-12-28 ENCOUNTER — Encounter (INDEPENDENT_AMBULATORY_CARE_PROVIDER_SITE_OTHER): Payer: Self-pay | Admitting: Nurse Practitioner

## 2021-12-28 ENCOUNTER — Ambulatory Visit (INDEPENDENT_AMBULATORY_CARE_PROVIDER_SITE_OTHER): Payer: Medicare HMO | Admitting: Nurse Practitioner

## 2021-12-28 VITALS — BP 113/74 | HR 84 | Temp 97.6°F | Ht 59.0 in | Wt 180.0 lb

## 2021-12-28 DIAGNOSIS — D649 Anemia, unspecified: Secondary | ICD-10-CM | POA: Diagnosis not present

## 2021-12-28 DIAGNOSIS — E559 Vitamin D deficiency, unspecified: Secondary | ICD-10-CM | POA: Diagnosis not present

## 2021-12-28 DIAGNOSIS — Z6836 Body mass index (BMI) 36.0-36.9, adult: Secondary | ICD-10-CM | POA: Diagnosis not present

## 2021-12-28 DIAGNOSIS — R7303 Prediabetes: Secondary | ICD-10-CM | POA: Diagnosis not present

## 2021-12-28 DIAGNOSIS — E669 Obesity, unspecified: Secondary | ICD-10-CM

## 2021-12-28 DIAGNOSIS — E7849 Other hyperlipidemia: Secondary | ICD-10-CM

## 2021-12-29 DIAGNOSIS — M25561 Pain in right knee: Secondary | ICD-10-CM | POA: Diagnosis not present

## 2021-12-29 LAB — CBC WITH DIFFERENTIAL/PLATELET
Basophils Absolute: 0.1 10*3/uL (ref 0.0–0.2)
Basos: 1 %
EOS (ABSOLUTE): 0.1 10*3/uL (ref 0.0–0.4)
Eos: 1 %
Hematocrit: 40.9 % (ref 34.0–46.6)
Hemoglobin: 12.8 g/dL (ref 11.1–15.9)
Immature Grans (Abs): 0 10*3/uL (ref 0.0–0.1)
Immature Granulocytes: 0 %
Lymphocytes Absolute: 2.8 10*3/uL (ref 0.7–3.1)
Lymphs: 47 %
MCH: 26.6 pg (ref 26.6–33.0)
MCHC: 31.3 g/dL — ABNORMAL LOW (ref 31.5–35.7)
MCV: 85 fL (ref 79–97)
Monocytes Absolute: 0.5 10*3/uL (ref 0.1–0.9)
Monocytes: 8 %
Neutrophils Absolute: 2.6 10*3/uL (ref 1.4–7.0)
Neutrophils: 43 %
Platelets: 368 10*3/uL (ref 150–450)
RBC: 4.81 x10E6/uL (ref 3.77–5.28)
RDW: 13 % (ref 11.7–15.4)
WBC: 6 10*3/uL (ref 3.4–10.8)

## 2021-12-29 LAB — HEMOGLOBIN A1C
Est. average glucose Bld gHb Est-mCnc: 111 mg/dL
Hgb A1c MFr Bld: 5.5 % (ref 4.8–5.6)

## 2021-12-29 LAB — COMPREHENSIVE METABOLIC PANEL
ALT: 9 IU/L (ref 0–32)
AST: 13 IU/L (ref 0–40)
Albumin/Globulin Ratio: 2.1 (ref 1.2–2.2)
Albumin: 5 g/dL — ABNORMAL HIGH (ref 3.8–4.8)
Alkaline Phosphatase: 119 IU/L (ref 44–121)
BUN/Creatinine Ratio: 19 (ref 12–28)
BUN: 16 mg/dL (ref 8–27)
Bilirubin Total: 0.3 mg/dL (ref 0.0–1.2)
CO2: 24 mmol/L (ref 20–29)
Calcium: 10.3 mg/dL (ref 8.7–10.3)
Chloride: 104 mmol/L (ref 96–106)
Creatinine, Ser: 0.84 mg/dL (ref 0.57–1.00)
Globulin, Total: 2.4 g/dL (ref 1.5–4.5)
Glucose: 89 mg/dL (ref 70–99)
Potassium: 4 mmol/L (ref 3.5–5.2)
Sodium: 145 mmol/L — ABNORMAL HIGH (ref 134–144)
Total Protein: 7.4 g/dL (ref 6.0–8.5)
eGFR: 75 mL/min/{1.73_m2} (ref >59)

## 2021-12-29 LAB — LIPID PANEL WITH LDL/HDL RATIO
Cholesterol, Total: 226 mg/dL — ABNORMAL HIGH (ref 100–199)
HDL: 76 mg/dL (ref >39)
LDL Chol Calc (NIH): 129 mg/dL — ABNORMAL HIGH (ref 0–99)
LDL/HDL Ratio: 1.7 ratio (ref 0.0–3.2)
Triglycerides: 123 mg/dL (ref 0–149)
VLDL Cholesterol Cal: 21 mg/dL (ref 5–40)

## 2021-12-29 LAB — INSULIN, RANDOM: INSULIN: 12.3 u[IU]/mL (ref 2.6–24.9)

## 2021-12-29 LAB — VITAMIN D 25 HYDROXY (VIT D DEFICIENCY, FRACTURES): Vit D, 25-Hydroxy: 58.4 ng/mL (ref 30.0–100.0)

## 2021-12-29 NOTE — Progress Notes (Signed)
? ? ? ?Chief Complaint:  ? ?OBESITY ?Darlene [name] is here to discuss her progress with her obesity treatment plan along with follow-up of her obesity related diagnoses. Darlene [name] is on keeping a food journal and adhering to recommended goals of [phone] calories and 80 grams of protein and states she is following her eating plan approximately 75% of the time. Darlene [name] states she is doing 0 minutes 0 times per week. ? ?Today's visit was #: 41 ?Starting weight: 231 lbs ?Starting date: 06/04/2019 ?Today's weight: 180 lbs ?Today's date: 12/28/2021 ?Total lbs lost to date: 51 lbs ?Total lbs lost since last in-office visit: 2 lbs ? ?Interim History: Darlene [name] has done well with weight loss since last visit. She denies hunger and reports occasionally cravings for sweets. She is still taking Ozemic 1 mg, she didn't decrease to 0.5 mg. She would like to stay on current dose. She feels she is following plan better. Tomorrow is her last day of physical therapy. She is walking with the aid of a cane. She is drinking water daily and rarely tea.  ? ?Subjective:  ? ?1. Other hyperlipidemia ?Darlene [name] is currently taking Pravastatin 10 mg. She denies side effects.  ? ?2. Pre-diabetes ?Darlene [name] is taking Ozempic 1 mg currently. She denies side effects. She would like to stay on currently dose.  ? ?3. Anemia, unspecified type ?Darlene [name] had surgery on 10/18/2021. She is not on iron or multivitamins. Her last hemoglobin was 9.9. ? ?4. Vitamin D deficiency ?Darlene [name] is taking Vitamin D 50,000 IU. She denies side effects nausea, vomiting and muscle weakness.  ? ?Assessment/Plan:  ? ?1. Other hyperlipidemia ?Cardiovascular risk and specific lipid/LDL goals reviewed.  Labs were obtained today. We discussed several lifestyle modifications today and Lisamaria will continue to work on diet, exercise and weight loss efforts. Orders and follow up as documented in patient record.  ? ?Counseling ?Intensive lifestyle modifications are the first line treatment for this  issue. ?Dietary changes: Increase soluble fiber. Decrease simple carbohydrates. ?Exercise changes: Moderate to vigorous-intensity aerobic activity 150 minutes per week if tolerated. ?Lipid-lowering medications: see documented in medical record. ? ?- CBC with Differential/Platelet ?- Comprehensive metabolic panel ?- Lipid Panel With LDL/HDL Ratio ? ?2. Pre-diabetes ?Jahaira will continue Ozempic 1 mg. We discussed side effects. Labs were obtained today. She will continue to work on weight loss, exercise, and decreasing simple carbohydrates to help decrease the risk of diabetes.  ? ?- CBC with Differential/Platelet ?- Comprehensive metabolic panel ?- Hemoglobin A1c ?- Insulin, random ? ?3. Anemia, unspecified type ?Orders and follow up as documented in patient record. Labs were obtained today.  ? ?Counseling ?Iron is essential for our bodies to make red blood cells.  Reasons that someone may be deficient include: an iron-deficient diet (more likely in those following vegan or vegetarian diets), women with heavy menses, patients with GI disorders or poor absorption, patients that have had bariatric surgery, frequent blood donors, patients with cancer, and patients with heart disease.   ?An iron supplement has been recommended. This is found over-the-counter.  ?Iron-rich foods include dark leafy greens, red and white meats, eggs, seafood, and beans.   ?Certain foods and drinks prevent your body from absorbing iron properly. Avoid eating these foods in the same meal as iron-rich foods or with iron supplements. These foods include: coffee, black tea, and red wine; milk, dairy products, and foods that are high in calcium; beans and soybeans; whole grains.  ?Constipation can be a side effect of iron supplementation. Increased water and  fiber intake are helpful. Water goal: > 2 liters/day. Fiber goal: > 25 grams/day.  ? ?- CBC with Differential/Platelet ?- Comprehensive metabolic panel ? ?4. Vitamin D deficiency ?Low Vitamin  D level contributes to fatigue and are associated with obesity, breast, and colon cancer. Labs were obtained today and Amarria will follow-up for routine testing of Vitamin D, at least 2-3 times per year to avoid over-replacement. ? ?- VITAMIN D 25 Hydroxy (Vit-D Deficiency, Fractures) ? ?5. Obesity: Current BMI 36.4 ?Darlene [name] is currently in the action stage of change. As such, her goal is to continue with weight loss efforts. She has agreed to keeping a food journal and adhering to recommended goals of [phone] calories and 80 plus grams of protein.  ? ?Exercise goals: No exercise has been prescribed at this time. ? ?Behavioral modification strategies: increasing lean protein intake and no skipping meals. ? ?Darlene [name] has agreed to follow-up with our clinic in 2 weeks. She was informed of the importance of frequent follow-up visits to maximize her success with intensive lifestyle modifications for her multiple health conditions.  ? ?Objective:  ? ?Blood pressure 113/74, pulse 84, temperature 97.6 ?F (36.4 ?C), height 4\' 11"  (1.499 m), weight 180 lb (81.6 kg), SpO2 99 %. ?Body mass index is 36.36 kg/m?. ? ?General: Cooperative, alert, well developed, in no acute distress. ?HEENT: Conjunctivae and lids unremarkable. ?Cardiovascular: Regular rhythm.  ?Lungs: Normal work of breathing. ?Neurologic: No focal deficits.  ? ?Lab Results  ?Component Value Date  ? CREATININE 0.84 12/28/2021  ? BUN 16 12/28/2021  ? NA 145 (H) 12/28/2021  ? K 4.0 12/28/2021  ? CL 104 12/28/2021  ? CO2 24 12/28/2021  ? ?Lab Results  ?Component Value Date  ? ALT 9 12/28/2021  ? AST 13 12/28/2021  ? ALKPHOS 119 12/28/2021  ? BILITOT 0.3 12/28/2021  ? ?Lab Results  ?Component Value Date  ? HGBA1C 5.5 12/28/2021  ? HGBA1C 5.8 (H) 08/25/2021  ? HGBA1C 5.5 03/10/2021  ? HGBA1C 5.8 (H) 11/03/2020  ? HGBA1C 5.9 (H) 06/23/2020  ? ?Lab Results  ?Component Value Date  ? INSULIN 12.3 12/28/2021  ? INSULIN 8.4 08/25/2021  ? INSULIN 5.8 03/10/2021  ? INSULIN  9.6 11/03/2020  ? INSULIN 10.5 06/23/2020  ? ?Lab Results  ?Component Value Date  ? TSH 1.620 06/04/2019  ? ?Lab Results  ?Component Value Date  ? CHOL 226 (H) 12/28/2021  ? HDL 76 12/28/2021  ? LDLCALC 129 (H) 12/28/2021  ? TRIG 123 12/28/2021  ? CHOLHDL 2.2 03/10/2021  ? ?Lab Results  ?Component Value Date  ? VD25OH 58.4 12/28/2021  ? VD25OH 77.4 08/25/2021  ? VD25OH 46.2 04/21/2021  ? ?Lab Results  ?Component Value Date  ? WBC 6.0 12/28/2021  ? HGB 12.8 12/28/2021  ? HCT 40.9 12/28/2021  ? MCV 85 12/28/2021  ? PLT 368 12/28/2021  ? ?No results found for: IRON, TIBC, FERRITIN ? ?Attestation Statements:  ? ?Reviewed by clinician on day of visit: allergies, medications, problem list, medical history, surgical history, family history, social history, and previous encounter notes. ? ?I, Lizbeth Bark, RMA, am acting as Location manager for Everardo Pacific, FNP. ? ?I have reviewed the above documentation for accuracy and completeness, and I agree with the above. Everardo Pacific, FNP ? ? ?

## 2021-12-31 ENCOUNTER — Other Ambulatory Visit (INDEPENDENT_AMBULATORY_CARE_PROVIDER_SITE_OTHER): Payer: Self-pay | Admitting: Family Medicine

## 2021-12-31 DIAGNOSIS — R7303 Prediabetes: Secondary | ICD-10-CM

## 2022-01-05 DIAGNOSIS — Z1211 Encounter for screening for malignant neoplasm of colon: Secondary | ICD-10-CM | POA: Diagnosis not present

## 2022-01-08 ENCOUNTER — Encounter: Payer: Self-pay | Admitting: *Deleted

## 2022-01-12 NOTE — Congregational Nurse Program (Signed)
?  Dept: [phone] ? ? ?Congregational Nurse Program Note ? ?Date of Encounter: 01/08/2022 ? ?Past Medical History: ?Past Medical History:  ?Diagnosis Date  ? Anxiety   ? Cataracts, bilateral   ? md just watching   ? Constipation   ? Depression   ? Elevated LFTs   ? history of, normal abdominal ultrasound, LFT's normal 01/2007  ? Gallbladder problem   ? History of chest pain   ? rulre out MI in 12/2006.  ? Hyperlipidemia   ? diet controlled  ? Hypertension   ? Obesity   ? Osteoarthritis of both knees 12/26/2007  ? Xray of knee 10/2007: Bilateral osteoarthritis, sent to PT but did not complete tx course.    ? Osteopenia 07/05/2015  ? Prediabetes 11/30/2015  ? ? ?Encounter Details: ? CNP Questionnaire - 01/08/22 1300  ? ?  ? Questionnaire  ? Do you give verbal consent to treat you today? Yes   ? Location Patient Served  [facility]   ? Visit Setting Church or Organization   ? Patient Status Unknown   ? Engineer, building services or [employer]   ? Insurance Referral N/A   ? Medication N/A   ? Medical Provider Yes   ? Screening Referrals N/A   ? Medical Referral N/A   ? Medical Appointment Made N/A   ? Food Have Food Insecurities   ? Transportation N/A   ? Housing/Utilities N/A   ? Interpersonal Safety N/A   ? Intervention Blood pressure   ? ED Visit Averted Yes   ? Life-Saving Intervention Made N/A   ? ?  ?  ? ?  ? ?[facility].  Has dull, achy discomfort in left shoulder and hard to raise arm over head.  No other complaints offered.  Will follow up with PCP as desired.  Will follow up with this CN as needed. ? ?Roderic Palau, RN, MSN, CNP ?[phone] Office ?[phone] Cell ? ? ? ?

## 2022-01-13 ENCOUNTER — Ambulatory Visit (INDEPENDENT_AMBULATORY_CARE_PROVIDER_SITE_OTHER): Payer: Medicare HMO | Admitting: Nurse Practitioner

## 2022-01-13 VITALS — BP 116/76 | HR 74 | Temp 97.7°F | Ht 59.0 in | Wt 182.0 lb

## 2022-01-13 DIAGNOSIS — E7849 Other hyperlipidemia: Secondary | ICD-10-CM | POA: Diagnosis not present

## 2022-01-13 DIAGNOSIS — D649 Anemia, unspecified: Secondary | ICD-10-CM | POA: Diagnosis not present

## 2022-01-13 DIAGNOSIS — R7303 Prediabetes: Secondary | ICD-10-CM

## 2022-01-13 DIAGNOSIS — Z6836 Body mass index (BMI) 36.0-36.9, adult: Secondary | ICD-10-CM | POA: Diagnosis not present

## 2022-01-13 DIAGNOSIS — E669 Obesity, unspecified: Secondary | ICD-10-CM | POA: Diagnosis not present

## 2022-01-13 DIAGNOSIS — E66813 Obesity, class 3: Secondary | ICD-10-CM

## 2022-01-13 DIAGNOSIS — E559 Vitamin D deficiency, unspecified: Secondary | ICD-10-CM

## 2022-01-13 MED ORDER — SEMAGLUTIDE (1 MG/DOSE) 4 MG/3ML ~~LOC~~ SOPN: 1.0000 mg | PEN_INJECTOR | SUBCUTANEOUS | 0 refills | Status: DC

## 2022-01-24 NOTE — Progress Notes (Signed)
? ? ? ?Chief Complaint:  ? ?OBESITY ?Darlene [name] is here to discuss her progress with her obesity treatment plan along with follow-up of her obesity related diagnoses. Darlene [name] is on keeping a food journal and adhering to recommended goals of 950 calories and 80 grams of protein and states she is following her eating plan approximately 70% of the time. [name] states she is doing leg left and arm raises for 15-20 minutes 3 times per week. ? ?Today's visit was #: 42 ?Starting weight: 231 lbs ?Starting date: 06/04/2019 ?Today's weight: 182 lbs ?Today's date: 01/13/2022 ?Total lbs lost to date: 49 lbs ?Total lbs lost since last in-office visit: 0 ? ?Interim History: Taleiyah is journaling and feels that she is doing well. She finished physical therapy 2 weeks ago. She is still doing physical therapy at home. She came in today without her cane. Her plans are to start going to the [facility].  ? ?Subjective:  ? ?1. Pre-diabetes ?Darlene [name] is currently taking Ozempic 0.5 mg. Darlene [name] was supposed to be taking 1 mg but wasn't able to get from the pharmacy. She denies side effects. Her last A1C looked better at 5.5. ? ?2. Other hyperlipidemia ?Darlene [name] is taking Pravastin 10 mg for 2-3 days currently. Denies side effects.  ? ?3. Vitamin D deficiency ?Darlene [name] is taking Vitamin D 50,000 IU every 14 days. Denies side effects. Denies nausea, vomiting or muscle weakness.  ? ?4. Anemia, unspecified type ?Darlene [name]'s anemia is resolved.  ? ?Assessment/Plan:  ? ?1. Pre-diabetes ?Darlene [name] will increased Ozempic back to 1 mg and we will refill Ozempic 1 mg for 1 month with no refills.  We discussed side effects. She will continue to work on weight loss, exercise, and decreasing simple carbohydrates to help decrease the risk of diabetes.  ? ?- Semaglutide, 1 MG/DOSE, 4 MG/3ML SOPN; Inject 1 mg as directed once a week.  Dispense: 9 mL; Refill: 0 ? ?2. Other hyperlipidemia ?Cardiovascular risk and specific lipid/LDL goals reviewed.  Darlene [name] will  continue to follow up with primary care physician. She will continue her medications as directed. We discussed several lifestyle modifications today and Darlene [name] will continue to work on diet, exercise and weight loss efforts. Orders and follow up as documented in patient record.  ? ?Counseling ?Intensive lifestyle modifications are the first line treatment for this issue. ?Dietary changes: Increase soluble fiber. Decrease simple carbohydrates. ?Exercise changes: Moderate to vigorous-intensity aerobic activity 150 minutes per week if tolerated. ?Lipid-lowering medications: see documented in medical record. ? ?3. Vitamin D deficiency ?Low Vitamin D level contributes to fatigue and are associated with obesity, breast, and colon cancer. Darlene [name] agrees to continue to take prescription Vitamin D 50,000 IU every 14 days  and she will follow-up for routine testing of Vitamin D, at least 2-3 times per year to avoid over-replacement. We discussed side effects.  ? ?4. Anemia, unspecified type ?I reviewed labs with Darlene [name] today.  ? ?5. Obesity: Current BMI 36.9 ?Darlene [name] is currently in the action stage of change. As such, her goal is to continue with weight loss efforts. She has agreed to keeping a food journal and adhering to recommended goals of [phone] calories and 80 grams of  protein.  ? ?Exercise goals:  Darlene [name] will start at the [facility]  ? ?Behavioral modification strategies: increasing lean protein intake, increasing vegetables, increasing water intake, and no skipping meals. ? ?Darlene [name] has agreed to follow-up with our clinic in 3 weeks. She was informed of the importance of frequent follow-up visits to  maximize her success with intensive lifestyle modifications for her multiple health conditions.  ? ?Objective:  ? ?Blood pressure 116/76, pulse 74, temperature 97.7 ?F (36.5 ?C), height 4\' 11"  (1.499 m), weight 182 lb (82.6 kg), SpO2 100 %. ?Body mass index is 36.76 kg/m?. ? ?General: Cooperative, alert, well  developed, in no acute distress. ?HEENT: Conjunctivae and lids unremarkable. ?Cardiovascular: Regular rhythm.  ?Lungs: Normal work of breathing. ?Neurologic: No focal deficits.  ? ?Lab Results  ?Component Value Date  ? CREATININE 0.84 12/28/2021  ? BUN 16 12/28/2021  ? NA 145 (H) 12/28/2021  ? K 4.0 12/28/2021  ? CL 104 12/28/2021  ? CO2 24 12/28/2021  ? ?Lab Results  ?Component Value Date  ? ALT 9 12/28/2021  ? AST 13 12/28/2021  ? ALKPHOS 119 12/28/2021  ? BILITOT 0.3 12/28/2021  ? ?Lab Results  ?Component Value Date  ? HGBA1C 5.5 12/28/2021  ? HGBA1C 5.8 (H) 08/25/2021  ? HGBA1C 5.5 03/10/2021  ? HGBA1C 5.8 (H) 11/03/2020  ? HGBA1C 5.9 (H) 06/23/2020  ? ?Lab Results  ?Component Value Date  ? INSULIN 12.3 12/28/2021  ? INSULIN 8.4 08/25/2021  ? INSULIN 5.8 03/10/2021  ? INSULIN 9.6 11/03/2020  ? INSULIN 10.5 06/23/2020  ? ?Lab Results  ?Component Value Date  ? TSH 1.620 06/04/2019  ? ?Lab Results  ?Component Value Date  ? CHOL 226 (H) 12/28/2021  ? HDL 76 12/28/2021  ? LDLCALC 129 (H) 12/28/2021  ? TRIG 123 12/28/2021  ? CHOLHDL 2.2 03/10/2021  ? ?Lab Results  ?Component Value Date  ? VD25OH 58.4 12/28/2021  ? VD25OH 77.4 08/25/2021  ? VD25OH 46.2 04/21/2021  ? ?Lab Results  ?Component Value Date  ? WBC 6.0 12/28/2021  ? HGB 12.8 12/28/2021  ? HCT 40.9 12/28/2021  ? MCV 85 12/28/2021  ? PLT 368 12/28/2021  ? ?No results found for: IRON, TIBC, FERRITIN ? ?Attestation Statements:  ? ?Reviewed by clinician on day of visit: allergies, medications, problem list, medical history, surgical history, family history, social history, and previous encounter notes. ? ?Time spent on visit including pre-visit chart review and post-visit care and charting was 30 minutes.  ? ?I, Lizbeth Bark, RMA, am acting as Location manager for Everardo Pacific, FNP. ? ?I have reviewed the above documentation for accuracy and completeness, and I agree with the above. Everardo Pacific, FNP  ?

## 2022-01-26 NOTE — Progress Notes (Signed)
?TeleHealth Visit:  ?This visit was completed with telemedicine (audio/video) technology. ?Darlene [name] has verbally consented to this TeleHealth visit. The patient is located at home, the provider is located at home. The participants in this visit include the listed provider and patient. The visit was conducted today via MyChart video. ? ?OBESITY ?Darlene [name] is here to discuss her progress with her obesity treatment plan along with follow-up of her obesity related diagnoses.  ? ?Today's visit was # 29 ?Starting weight: 213 lbs ?Starting date: 06/04/2019 ?Weight at last in office visit: 182 lbs on 01/13/22 ?Total weight loss: 31 lbs at last in office visit on 01/13/22. ?Today's reported weight: 183 lbs  ? ?Nutrition Plan: keeping a food journal and adhering to recommended goals of [phone] calories and 80 gms protein.  ?Hunger is well controlled. Cravings are well controlled.  ?Current exercise:  Doing exercises prescribed by PT for her knee. ? ?Interim History: Darlene [name] is tired and frustrated with her meal plan.  She reports she has been keeping track of calories and protein for a long time but has not seen any progress.  She feels she did better when she was doing her own vegetarian plan. ? ?Her weight has been plateaued since November 2022.  She is also wondering if the Ozempic is really helping her.  We started Ozempic in September 2021 and her weight was 197 pounds.  She had been stalled with weight loss for several months and was actually regaining some weight. ?We have stopped Ozempic in the past when she had the same thoughts but she found that it really did help with her appetite so we restarted it. ? ?Assessment/Plan:  ?Prediabetes ?Darlene [name] A1c has fluctuated over the past 3 years between 5.5 and 5.9.  It was as high as 6.2 back in 2016. ?She denies polyphagia. ?Medication(s): Ozempic 1 mg weekly. ?Lab Results  ?Component Value Date  ? HGBA1C 5.5 12/28/2021  ? ?Lab Results  ?Component Value Date  ? INSULIN  12.3 12/28/2021  ? INSULIN 8.4 08/25/2021  ? INSULIN 5.8 03/10/2021  ? INSULIN 9.6 11/03/2020  ? INSULIN 10.5 06/23/2020  ? ? ?Plan: ?Darlene [name] thinks she may want to stop the Ozempic.  She has a 84-month supply.  I advised her to check the expiration date of the Ozempic and if it is good for another year or so she could go ahead and take a break from the Darlene [name] to see how she feels.  Then she could restart if needed and use the medication that she has saved. ? ?Obesity: Current BMI 36.74 ?Darlene [name] is currently in the action stage of change. As such, her goal is to continue with weight loss efforts.  ?She has agreed to practicing portion control and making smarter food choices, such as increasing vegetables and decreasing simple carbohydrates.  ?Darlene [name] feels like she needs a break from journaling so I switched her to portion control smart choices.  I encouraged her to continue to choose high-protein foods such as meat, beans, quinoa. ? ?Exercise goals: Continue current regimen. ?We did discuss that strength training 3 times a week at the gym would help with her metabolism as she builds muscle mass.  She says she may start going to the gym because she has Silver sneakers through her insurance. ? ?Behavioral modification strategies: increasing lean protein intake and decreasing simple carbohydrates. ? ?Darlene [name] has agreed to follow-up with our clinic in 5 weeks.  ? ?No orders of the defined types were placed in this encounter. ? ? ?There  are no discontinued medications.  ? ?No orders of the defined types were placed in this encounter. ?   ? ?Objective:  ? ?VITALS: Per patient if applicable, see vitals. ?GENERAL: Alert and in no acute distress. ?CARDIOPULMONARY: No increased WOB. Speaking in clear sentences.  ?PSYCH: Pleasant and cooperative. Speech normal rate and rhythm. Affect is appropriate. Insight and judgement are appropriate. Attention is focused, linear, and appropriate.  ?NEURO: Oriented as arrived to  appointment on time with no prompting.  ? ?Lab Results  ?Component Value Date  ? CREATININE 0.84 12/28/2021  ? BUN 16 12/28/2021  ? NA 145 (H) 12/28/2021  ? K 4.0 12/28/2021  ? CL 104 12/28/2021  ? CO2 24 12/28/2021  ? ?Lab Results  ?Component Value Date  ? ALT 9 12/28/2021  ? AST 13 12/28/2021  ? ALKPHOS 119 12/28/2021  ? BILITOT 0.3 12/28/2021  ? ?Lab Results  ?Component Value Date  ? HGBA1C 5.5 12/28/2021  ? HGBA1C 5.8 (H) 08/25/2021  ? HGBA1C 5.5 03/10/2021  ? HGBA1C 5.8 (H) 11/03/2020  ? HGBA1C 5.9 (H) 06/23/2020  ? ?Lab Results  ?Component Value Date  ? INSULIN 12.3 12/28/2021  ? INSULIN 8.4 08/25/2021  ? INSULIN 5.8 03/10/2021  ? INSULIN 9.6 11/03/2020  ? INSULIN 10.5 06/23/2020  ? ?Lab Results  ?Component Value Date  ? TSH 1.620 06/04/2019  ? ?Lab Results  ?Component Value Date  ? CHOL 226 (H) 12/28/2021  ? HDL 76 12/28/2021  ? LDLCALC 129 (H) 12/28/2021  ? TRIG 123 12/28/2021  ? CHOLHDL 2.2 03/10/2021  ? ?Lab Results  ?Component Value Date  ? WBC 6.0 12/28/2021  ? HGB 12.8 12/28/2021  ? HCT 40.9 12/28/2021  ? MCV 85 12/28/2021  ? PLT 368 12/28/2021  ? ?No results found for: IRON, TIBC, FERRITIN ?Lab Results  ?Component Value Date  ? VD25OH 58.4 12/28/2021  ? VD25OH 77.4 08/25/2021  ? VD25OH 46.2 04/21/2021  ? ? ?Attestation Statements:  ? ?Reviewed by clinician on day of visit: allergies, medications, problem list, medical history, surgical history, family history, social history, and previous encounter notes. ? ? ?

## 2022-01-27 ENCOUNTER — Encounter (INDEPENDENT_AMBULATORY_CARE_PROVIDER_SITE_OTHER): Payer: Self-pay

## 2022-01-27 ENCOUNTER — Telehealth (INDEPENDENT_AMBULATORY_CARE_PROVIDER_SITE_OTHER): Payer: Medicare HMO | Admitting: Family Medicine

## 2022-01-27 ENCOUNTER — Encounter (INDEPENDENT_AMBULATORY_CARE_PROVIDER_SITE_OTHER): Payer: Self-pay | Admitting: Family Medicine

## 2022-01-27 DIAGNOSIS — R7303 Prediabetes: Secondary | ICD-10-CM

## 2022-01-27 DIAGNOSIS — E669 Obesity, unspecified: Secondary | ICD-10-CM

## 2022-01-27 DIAGNOSIS — Z6836 Body mass index (BMI) 36.0-36.9, adult: Secondary | ICD-10-CM | POA: Diagnosis not present

## 2022-02-10 DIAGNOSIS — Z1231 Encounter for screening mammogram for malignant neoplasm of breast: Secondary | ICD-10-CM | POA: Diagnosis not present

## 2022-02-28 ENCOUNTER — Ambulatory Visit (INDEPENDENT_AMBULATORY_CARE_PROVIDER_SITE_OTHER): Payer: Medicare HMO | Admitting: Nurse Practitioner

## 2022-03-01 ENCOUNTER — Encounter (INDEPENDENT_AMBULATORY_CARE_PROVIDER_SITE_OTHER): Payer: Self-pay | Admitting: Nurse Practitioner

## 2022-03-01 ENCOUNTER — Ambulatory Visit (INDEPENDENT_AMBULATORY_CARE_PROVIDER_SITE_OTHER): Payer: Medicare HMO | Admitting: Nurse Practitioner

## 2022-03-01 VITALS — BP 113/73 | HR 70 | Temp 98.1°F | Ht 59.0 in | Wt 181.0 lb

## 2022-03-01 DIAGNOSIS — E669 Obesity, unspecified: Secondary | ICD-10-CM

## 2022-03-01 DIAGNOSIS — E559 Vitamin D deficiency, unspecified: Secondary | ICD-10-CM | POA: Diagnosis not present

## 2022-03-01 DIAGNOSIS — Z7985 Long-term (current) use of injectable non-insulin antidiabetic drugs: Secondary | ICD-10-CM

## 2022-03-01 DIAGNOSIS — R7303 Prediabetes: Secondary | ICD-10-CM

## 2022-03-01 DIAGNOSIS — Z6836 Body mass index (BMI) 36.0-36.9, adult: Secondary | ICD-10-CM

## 2022-03-01 DIAGNOSIS — Z6841 Body Mass Index (BMI) 40.0 and over, adult: Secondary | ICD-10-CM

## 2022-03-01 MED ORDER — VITAMIN D (ERGOCALCIFEROL) 1.25 MG (50000 UNIT) PO CAPS: 50000.0000 [IU] | ORAL_CAPSULE | ORAL | 0 refills | Status: DC

## 2022-03-02 NOTE — Progress Notes (Signed)
Chief Complaint:   OBESITY Darlene [name] is here to discuss her progress with her obesity treatment plan along with follow-up of her obesity related diagnoses. Darlene [name] is on practicing portion control and making smarter food choices, such as increasing vegetables and decreasing simple carbohydrates plus 80 grams of protein. Darlene [name] states she is doing stretching, arm raises, leg lifts, and squats for 20-25 minutes 4 times per week.  Today's visit was #: 70 Starting weight: 213 lbs Starting date: 06/04/2019 Today's weight: 181 lbs Today's date:03/01/2022 Total lbs lost to date: 32 lbs Total lbs lost since last in-office visit: 1 lb  Interim History: Darlene [name] currently is following [facility]. She would like to change over to [employer] plan. She felt she did better with losing weight with this plan. She is going to the [facility] 2 days every other week and exercise 4 days per week.   Subjective:   1. Vitamin D deficiency Debroh is currently taking prescription vitamin D 50,000 IU every 2 weeks. She denies nausea, vomiting or muscle weakness.  2. Pre-diabetes Nakenya is taking Ozempic 1 mg. She has decided that she would like to continue Ozempic at this time. She feels that it helps with appetite control. She denies hunger or cravings while taking.   Assessment/Plan:   1. Vitamin D deficiency Low Vitamin D level contributes to fatigue and are associated with obesity, breast, and colon cancer. We will refill prescription Vitamin D 50,000 IU every week for 1 month with no refills and Darlene [name] will follow-up for routine testing of Vitamin D, at least 2-3 times per year to avoid over-replacement.  - Vitamin D, Ergocalciferol, (DRISDOL) 1.25 MG (50000 UNIT) CAPS capsule; Take 1 capsule (50,000 Units total) by mouth every 14 (fourteen) days. Saturdays.  Dispense: 4 capsule; Refill: 0  2. Pre-diabetes Darlene [name] will continue to work on weight loss, exercise, and decreasing simple carbohydrates to  help decrease the risk of diabetes.   3. Obesity: Current BMI 36.6 Darlene [name] is currently in the action stage of change. As such, her goal is to continue with weight loss efforts. She has agreed to the [employer].   Exercise goals:  Darlene [name] plans to go to the [employer].   Behavioral modification strategies: increasing lean protein intake, increasing vegetables, increasing water intake, no skipping meals, and planning for success.  Darlene [name] has agreed to follow-up with our clinic in 4 weeks. She was informed of the importance of frequent follow-up visits to maximize her success with intensive lifestyle modifications for her multiple health conditions.   Objective:   Blood pressure 113/73, pulse 70, temperature 98.1 F (36.7 C), height 4\' 11"  (1.499 m), weight 181 lb (82.1 kg), SpO2 99 %. Body mass index is 36.56 kg/m.  General: Cooperative, alert, well developed, in no acute distress. HEENT: Conjunctivae and lids unremarkable. Cardiovascular: Regular rhythm.  Lungs: Normal work of breathing. Neurologic: No focal deficits.   Lab Results  Component Value Date   CREATININE 0.84 12/28/2021   BUN 16 12/28/2021   NA 145 (H) 12/28/2021   K 4.0 12/28/2021   CL 104 12/28/2021   CO2 24 12/28/2021   Lab Results  Component Value Date   ALT 9 12/28/2021   AST 13 12/28/2021   ALKPHOS 119 12/28/2021   BILITOT 0.3 12/28/2021   Lab Results  Component Value Date   HGBA1C 5.5 12/28/2021   HGBA1C 5.8 (H) 08/25/2021   HGBA1C 5.5 03/10/2021   HGBA1C 5.8 (H) 11/03/2020   HGBA1C 5.9 (H)  06/23/2020   Lab Results  Component Value Date   INSULIN 12.3 12/28/2021   INSULIN 8.4 08/25/2021   INSULIN 5.8 03/10/2021   INSULIN 9.6 11/03/2020   INSULIN 10.5 06/23/2020   Lab Results  Component Value Date   TSH 1.620 06/04/2019   Lab Results  Component Value Date   CHOL 226 (H) 12/28/2021   HDL 76 12/28/2021   LDLCALC 129 (H) 12/28/2021   TRIG 123 12/28/2021   CHOLHDL 2.2  03/10/2021   Lab Results  Component Value Date   VD25OH 58.4 12/28/2021   VD25OH 77.4 08/25/2021   VD25OH 46.2 04/21/2021   Lab Results  Component Value Date   WBC 6.0 12/28/2021   HGB 12.8 12/28/2021   HCT 40.9 12/28/2021   MCV 85 12/28/2021   PLT 368 12/28/2021   No results found for: IRON, TIBC, FERRITIN  Attestation Statements:   Reviewed by clinician on day of visit: allergies, medications, problem list, medical history, surgical history, family history, social history, and previous encounter notes.  I, Lizbeth Bark, RMA, am acting as Location manager for Everardo Pacific, FNP.   I have reviewed the above documentation for accuracy and completeness, and I agree with the above. Everardo Pacific, FNP

## 2022-03-17 ENCOUNTER — Ambulatory Visit (INDEPENDENT_AMBULATORY_CARE_PROVIDER_SITE_OTHER): Payer: Medicare HMO | Admitting: Nurse Practitioner

## 2022-03-24 ENCOUNTER — Other Ambulatory Visit (INDEPENDENT_AMBULATORY_CARE_PROVIDER_SITE_OTHER): Payer: Self-pay | Admitting: Nurse Practitioner

## 2022-03-24 DIAGNOSIS — H25813 Combined forms of age-related cataract, bilateral: Secondary | ICD-10-CM | POA: Diagnosis not present

## 2022-03-24 DIAGNOSIS — H04123 Dry eye syndrome of bilateral lacrimal glands: Secondary | ICD-10-CM | POA: Diagnosis not present

## 2022-03-24 DIAGNOSIS — H5203 Hypermetropia, bilateral: Secondary | ICD-10-CM | POA: Diagnosis not present

## 2022-03-24 DIAGNOSIS — R7303 Prediabetes: Secondary | ICD-10-CM

## 2022-03-24 DIAGNOSIS — H35033 Hypertensive retinopathy, bilateral: Secondary | ICD-10-CM | POA: Diagnosis not present

## 2022-03-30 ENCOUNTER — Ambulatory Visit (INDEPENDENT_AMBULATORY_CARE_PROVIDER_SITE_OTHER): Payer: Medicare HMO | Admitting: Nurse Practitioner

## 2022-03-30 NOTE — Progress Notes (Signed)
TeleHealth Visit:  This visit was completed with telemedicine (audio/video) technology. Darlene [name] has verbally consented to this TeleHealth visit. The patient is located at home, the provider is located at home. The participants in this visit include the listed provider and patient. The visit was conducted today via MyChart video.  OBESITY Darlene [name] is here to discuss her progress with her obesity treatment plan along with follow-up of her obesity related diagnoses.   Today's visit was # 45 Starting weight: 213 lbs Starting date: 06/04/2019 Weight at last in office visit: 181 lbs on 03/01/22 Total weight loss: 32 lbs at last in office visit on 03/01/22. Today's reported weight: 183 lbs   Nutrition Plan: the Pescatarian Plan but switched to [phone] cal and 80 gms of protein.  Hunger is well controlled.  Current exercise: bicycling 15 minutes twice daily every other day. Walking 30 minutes every other day. Stretching.  Interim History: Darlene [name] reports that she got a bit bored with the pescatarian plan so she switched back to journaling.  However she has not been journaling much because she has been busy.  She drinks only water and in an adequate amount. She has really increased her activity recently and is considering starting water aerobics. She would like to schedule another virtual office visit because she is unsure what her schedule will look like over the next few weeks.  Assessment/Plan:  1. Hyperlipidemia LDL is not at goal.  Last LDL was elevated at 129 but improved from November 2022 when it was 142. Medication(s): Prescribed pravastatin 10 mg-taking it every other day due to myalgias if she takes more often. Cardiovascular risk factors: advanced age (older than 50 for men, 67 for women), dyslipidemia, hypertension, and obesity (BMI >= 30 kg/m2)  Lab Results  Component Value Date   CHOL 226 (H) 12/28/2021   HDL 76 12/28/2021   LDLCALC 129 (H) 12/28/2021   TRIG 123 12/28/2021    CHOLHDL 2.2 03/10/2021   Lab Results  Component Value Date   ALT 9 12/28/2021   AST 13 12/28/2021   ALKPHOS 119 12/28/2021   BILITOT 0.3 12/28/2021   The 10-year ASCVD risk score (Arnett DK, et al., 2019) is: 10%   Values used to calculate the score:     Age: 71 years     Sex: Female     Is Non-Hispanic African American: Yes     Diabetic: No     Tobacco smoker: No     Systolic Blood Pressure: 113 mmHg     Is BP treated: Yes     HDL Cholesterol: 76 mg/dL     Total Cholesterol: 226 mg/dL  Plan: Continue pravastatin 10 mg every other day.  2. Prediabetes A1c now at goal (5.5).  Has been as high as 6.2 back in 2016. She denies polyphagia. Medication(s): Ozempic 1 mg-tolerating well.  Feels it helps with appetite. Lab Results  Component Value Date   HGBA1C 5.5 12/28/2021   Lab Results  Component Value Date   INSULIN 12.3 12/28/2021   INSULIN 8.4 08/25/2021   INSULIN 5.8 03/10/2021   INSULIN 9.6 11/03/2020   INSULIN 10.5 06/23/2020    Plan: Refill Ozempic 1 mg weekly.   3. Obesity: Current BMI 36.6 Darlene [name] is currently in the action stage of change. As such, her goal is to continue with weight loss efforts.  She has agreed to keeping a food journal and adhering to recommended goals of [phone] calories and 80 gms protein.   Exercise goals: Continue current cardio  regimen.  Add hand weights a few days per week.  Behavioral modification strategies: increasing lean protein intake, decreasing simple carbohydrates, planning for success, and keeping a strict food journal.  Darlene [name] has agreed to follow-up with our clinic in 2 weeks.   No orders of the defined types were placed in this encounter.   Medications Discontinued During This Encounter  Medication Reason   Semaglutide, 1 MG/DOSE, 4 MG/3ML SOPN Reorder     Meds ordered this encounter  Medications   Semaglutide, 1 MG/DOSE, 4 MG/3ML SOPN    Sig: Inject 1 mg as directed once a week.    Dispense:  9 mL     Refill:  0    3 month supply    Order Specific Question:   Supervising Provider    Answer:   Quillian Quince D [[id number]]      Objective:   VITALS: Per patient if applicable, see vitals. GENERAL: Alert and in no acute distress. CARDIOPULMONARY: No increased WOB. Speaking in clear sentences.  PSYCH: Pleasant and cooperative. Speech normal rate and rhythm. Affect is appropriate. Insight and judgement are appropriate. Attention is focused, linear, and appropriate.  NEURO: Oriented as arrived to appointment on time with no prompting.   Lab Results  Component Value Date   CREATININE 0.84 12/28/2021   BUN 16 12/28/2021   NA 145 (H) 12/28/2021   K 4.0 12/28/2021   CL 104 12/28/2021   CO2 24 12/28/2021   Lab Results  Component Value Date   ALT 9 12/28/2021   AST 13 12/28/2021   ALKPHOS 119 12/28/2021   BILITOT 0.3 12/28/2021   Lab Results  Component Value Date   HGBA1C 5.5 12/28/2021   HGBA1C 5.8 (H) 08/25/2021   HGBA1C 5.5 03/10/2021   HGBA1C 5.8 (H) 11/03/2020   HGBA1C 5.9 (H) 06/23/2020   Lab Results  Component Value Date   INSULIN 12.3 12/28/2021   INSULIN 8.4 08/25/2021   INSULIN 5.8 03/10/2021   INSULIN 9.6 11/03/2020   INSULIN 10.5 06/23/2020   Lab Results  Component Value Date   TSH 1.620 06/04/2019   Lab Results  Component Value Date   CHOL 226 (H) 12/28/2021   HDL 76 12/28/2021   LDLCALC 129 (H) 12/28/2021   TRIG 123 12/28/2021   CHOLHDL 2.2 03/10/2021   Lab Results  Component Value Date   WBC 6.0 12/28/2021   HGB 12.8 12/28/2021   HCT 40.9 12/28/2021   MCV 85 12/28/2021   PLT 368 12/28/2021   No results found for: "IRON", "TIBC", "FERRITIN" Lab Results  Component Value Date   VD25OH 58.4 12/28/2021   VD25OH 77.4 08/25/2021   VD25OH 46.2 04/21/2021    Attestation Statements:   Reviewed by clinician on day of visit: allergies, medications, problem list, medical history, surgical history, family history, social history, and previous encounter  notes.

## 2022-04-04 ENCOUNTER — Telehealth (INDEPENDENT_AMBULATORY_CARE_PROVIDER_SITE_OTHER): Payer: Medicare HMO | Admitting: Family Medicine

## 2022-04-04 ENCOUNTER — Encounter (INDEPENDENT_AMBULATORY_CARE_PROVIDER_SITE_OTHER): Payer: Self-pay | Admitting: Family Medicine

## 2022-04-04 DIAGNOSIS — E7849 Other hyperlipidemia: Secondary | ICD-10-CM | POA: Diagnosis not present

## 2022-04-04 DIAGNOSIS — R7303 Prediabetes: Secondary | ICD-10-CM

## 2022-04-04 DIAGNOSIS — E669 Obesity, unspecified: Secondary | ICD-10-CM | POA: Diagnosis not present

## 2022-04-04 DIAGNOSIS — Z6836 Body mass index (BMI) 36.0-36.9, adult: Secondary | ICD-10-CM | POA: Diagnosis not present

## 2022-04-04 MED ORDER — SEMAGLUTIDE (1 MG/DOSE) 4 MG/3ML ~~LOC~~ SOPN: 1.0000 mg | PEN_INJECTOR | SUBCUTANEOUS | 0 refills | Status: DC

## 2022-04-15 ENCOUNTER — Telehealth: Payer: Self-pay | Admitting: Podiatrist

## 2022-04-15 NOTE — Telephone Encounter (Signed)
Called and spoke to Darlene [name] to let her know I can fax the requested itemized billing statement requested. She told me I could fax that information to her at [phone].

## 2022-04-15 NOTE — Telephone Encounter (Signed)
This is Darlene [name] calling from [facility]. [name] was seen at your facility on 01/06/20 and 03/12/20. We requested itemized bills and records on 03/27/20 and we only received the records but never got the itemized billing. Its the last thing I need in order to send her claim to the insurance company for settlement. I really need this itemized billing statement. Please return my call at [phone]. Thank you.

## 2022-04-18 NOTE — Progress Notes (Signed)
TeleHealth Visit:  This visit was completed with telemedicine (audio/video) technology. [name] has verbally consented to this TeleHealth visit. The patient is located at home, the provider is located at home. The participants in this visit include the listed provider and patient. The visit was conducted today via MyChart video.  OBESITY Darlene [name] is here to discuss her progress with her obesity treatment plan along with follow-up of her obesity related diagnoses.   Today's visit was # 46 Starting weight: 213 lbs Starting date: 06/04/2019 Weight at last in office visit: 181 lbs on 03/01/22 Total weight loss: 32 lbs at last in office visit on 03/01/22. Today's reported weight: 183 lbs   Nutrition Plan: keeping a food journal and adhering to recommended goals of [phone] calories and 80 gms protein.  Hunger is moderately controlled. Cravings are moderately controlled.  Current exercise: Bicycling 15 minutes twice daily every other day. Walking 30 minutes a few times per week. Squats. Hand weights occasionally.   Interim History: [name] feels she is having more hunger and sweets cravings recently.  She journals 5 days/week.  Mostly stays at goal with calories but occasionally goes over.  Protein intake is between 60 and 80 g/day. Reports it gets very tiring and frustrating to constantly journal/think about her food and food prep. She is doing several different exercise activities and has been including hand weights recently.  She would like to continue follow-up every 2 weeks.  Wants to stay with Judeth Cornfield but Judeth Cornfield has no openings in 2 weeks so I will schedule another virtual for her.  Assessment/Plan:  1. Prediabetes Last A1c was 5.5. Medication(s): Ozempic 1 mg weekly.  Notes hunger has increased. Lab Results  Component Value Date   HGBA1C 5.5 12/28/2021   Lab Results  Component Value Date   INSULIN 12.3 12/28/2021   INSULIN 8.4 08/25/2021   INSULIN 5.8 03/10/2021    INSULIN 9.6 11/03/2020   INSULIN 10.5 06/23/2020    Plan: Continue Ozempic 1 mg weekly.  She has a 29-month supply.  Consider increasing dose to 2 mg at next refill.   2. Hyperlipidemia LDL is not at goal.  Last LDL was 129 on 12/28/2021. Medication(s): Pravastatin 10 mg prescribed daily-she takes it 2-3 times per week due to myalgias. Cardiovascular risk factors: advanced age (older than 109 for men, 56 for women), dyslipidemia, hypertension, and obesity (BMI >= 30 kg/m2)  Lab Results  Component Value Date   CHOL 226 (H) 12/28/2021   HDL 76 12/28/2021   LDLCALC 129 (H) 12/28/2021   TRIG 123 12/28/2021   CHOLHDL 2.2 03/10/2021   Lab Results  Component Value Date   ALT 9 12/28/2021   AST 13 12/28/2021   ALKPHOS 119 12/28/2021   BILITOT 0.3 12/28/2021   The 10-year ASCVD risk score (Arnett DK, et al., 2019) is: 10%   Values used to calculate the score:     Age: 81 years     Sex: Female     Is Non-Hispanic African American: Yes     Diabetic: No     Tobacco smoker: No     Systolic Blood Pressure: 113 mmHg     Is BP treated: Yes     HDL Cholesterol: 76 mg/dL     Total Cholesterol: 226 mg/dL  Plan: Continue statin.   3. Obesity: Current BMI 36.6 Adalyne is currently in the action stage of change. As such, her goal is to continue with weight loss efforts.  She has agreed to keeping a food journal  and adhering to recommended goals of [phone] calories and 80 gms protein.   Exercise goals: as is.  Behavioral modification strategies: increasing lean protein intake.  Latrise has agreed to follow-up with our clinic in 2 weeks.   No orders of the defined types were placed in this encounter.   There are no discontinued medications.   No orders of the defined types were placed in this encounter.     Objective:   VITALS: Per patient if applicable, see vitals. GENERAL: Alert and in no acute distress. CARDIOPULMONARY: No increased WOB. Speaking in clear sentences.  PSYCH:  Pleasant and cooperative. Speech normal rate and rhythm. Affect is appropriate. Insight and judgement are appropriate. Attention is focused, linear, and appropriate.  NEURO: Oriented as arrived to appointment on time with no prompting.   Lab Results  Component Value Date   CREATININE 0.84 12/28/2021   BUN 16 12/28/2021   NA 145 (H) 12/28/2021   K 4.0 12/28/2021   CL 104 12/28/2021   CO2 24 12/28/2021   Lab Results  Component Value Date   ALT 9 12/28/2021   AST 13 12/28/2021   ALKPHOS 119 12/28/2021   BILITOT 0.3 12/28/2021   Lab Results  Component Value Date   HGBA1C 5.5 12/28/2021   HGBA1C 5.8 (H) 08/25/2021   HGBA1C 5.5 03/10/2021   HGBA1C 5.8 (H) 11/03/2020   HGBA1C 5.9 (H) 06/23/2020   Lab Results  Component Value Date   INSULIN 12.3 12/28/2021   INSULIN 8.4 08/25/2021   INSULIN 5.8 03/10/2021   INSULIN 9.6 11/03/2020   INSULIN 10.5 06/23/2020   Lab Results  Component Value Date   TSH 1.620 06/04/2019   Lab Results  Component Value Date   CHOL 226 (H) 12/28/2021   HDL 76 12/28/2021   LDLCALC 129 (H) 12/28/2021   TRIG 123 12/28/2021   CHOLHDL 2.2 03/10/2021   Lab Results  Component Value Date   WBC 6.0 12/28/2021   HGB 12.8 12/28/2021   HCT 40.9 12/28/2021   MCV 85 12/28/2021   PLT 368 12/28/2021   No results found for: "IRON", "TIBC", "FERRITIN" Lab Results  Component Value Date   VD25OH 58.4 12/28/2021   VD25OH 77.4 08/25/2021   VD25OH 46.2 04/21/2021    Attestation Statements:   Reviewed by clinician on day of visit: allergies, medications, problem list, medical history, surgical history, family history, social history, and previous encounter notes.   Time spent on visit including the items listed below was 31 minutes.  -preparing to see the patient (e.g., review of tests, history, previous notes) -obtaining and/or reviewing separately obtained history -counseling and educating the patient/family/caregiver -documenting clinical information  in the electronic or other health record

## 2022-04-19 ENCOUNTER — Telehealth (INDEPENDENT_AMBULATORY_CARE_PROVIDER_SITE_OTHER): Payer: Medicare HMO | Admitting: Family Medicine

## 2022-04-19 ENCOUNTER — Encounter (INDEPENDENT_AMBULATORY_CARE_PROVIDER_SITE_OTHER): Payer: Self-pay | Admitting: Family Medicine

## 2022-04-19 DIAGNOSIS — Z6836 Body mass index (BMI) 36.0-36.9, adult: Secondary | ICD-10-CM | POA: Diagnosis not present

## 2022-04-19 DIAGNOSIS — E7849 Other hyperlipidemia: Secondary | ICD-10-CM

## 2022-04-19 DIAGNOSIS — R7303 Prediabetes: Secondary | ICD-10-CM | POA: Diagnosis not present

## 2022-04-19 DIAGNOSIS — E669 Obesity, unspecified: Secondary | ICD-10-CM

## 2022-05-02 NOTE — Progress Notes (Deleted)
TeleHealth Visit:  This visit was completed with telemedicine (audio/video) technology. Darlene [name] has verbally consented to this TeleHealth visit. The patient is located at home, the provider is located at home. The participants in this visit include the listed provider and patient. The visit was conducted today via MyChart video.  OBESITY Darlene [name] is here to discuss her progress with her obesity treatment plan along with follow-up of her obesity related diagnoses.   Today's visit was # 47 Starting weight: 213 lbs Starting date: 06/04/2019 Weight at last in office visit: 181 lbs on 03/01/22 Total weight loss: 32 lbs at last in office visit on 03/01/22. Today's reported weight: *** lbs No weight reported.  Nutrition Plan: keeping a food journal and adhering to recommended goals of [phone] calories and 80 gms protein.  Hunger is {EWCONTROLASSESSMENT:24261}. Cravings are {EWCONTROLASSESSMENT:24261}.  Current exercise: {exercise types:16438}  Interim History: ***  Assessment/Plan:  1. ***  2. ***  3. ***  Obesity: Current BMI *** Darlene [name] {CHL AMB IS/IS NOT:210130109} currently in the action stage of change. As such, her goal is to {MWMwtloss#1:210800005}.  She has agreed to {MWMwtlossportion/plan2:23431}.   Exercise goals: {MWM EXERCISE RECS:23473}  Behavioral modification strategies: {MWMwtlossdietstrategies3:23432}.  Darlene [name] has agreed to follow-up with our clinic in {NUMBER 1-10:22536} weeks.   No orders of the defined types were placed in this encounter.   There are no discontinued medications.   No orders of the defined types were placed in this encounter.     Objective:   VITALS: Per patient if applicable, see vitals. GENERAL: Alert and in no acute distress. CARDIOPULMONARY: No increased WOB. Speaking in clear sentences.  PSYCH: Pleasant and cooperative. Speech normal rate and rhythm. Affect is appropriate. Insight and judgement are appropriate. Attention is  focused, linear, and appropriate.  NEURO: Oriented as arrived to appointment on time with no prompting.   Lab Results  Component Value Date   CREATININE 0.84 12/28/2021   BUN 16 12/28/2021   NA 145 (H) 12/28/2021   K 4.0 12/28/2021   CL 104 12/28/2021   CO2 24 12/28/2021   Lab Results  Component Value Date   ALT 9 12/28/2021   AST 13 12/28/2021   ALKPHOS 119 12/28/2021   BILITOT 0.3 12/28/2021   Lab Results  Component Value Date   HGBA1C 5.5 12/28/2021   HGBA1C 5.8 (H) 08/25/2021   HGBA1C 5.5 03/10/2021   HGBA1C 5.8 (H) 11/03/2020   HGBA1C 5.9 (H) 06/23/2020   Lab Results  Component Value Date   INSULIN 12.3 12/28/2021   INSULIN 8.4 08/25/2021   INSULIN 5.8 03/10/2021   INSULIN 9.6 11/03/2020   INSULIN 10.5 06/23/2020   Lab Results  Component Value Date   TSH 1.620 06/04/2019   Lab Results  Component Value Date   CHOL 226 (H) 12/28/2021   HDL 76 12/28/2021   LDLCALC 129 (H) 12/28/2021   TRIG 123 12/28/2021   CHOLHDL 2.2 03/10/2021   Lab Results  Component Value Date   WBC 6.0 12/28/2021   HGB 12.8 12/28/2021   HCT 40.9 12/28/2021   MCV 85 12/28/2021   PLT 368 12/28/2021   No results found for: "IRON", "TIBC", "FERRITIN" Lab Results  Component Value Date   VD25OH 58.4 12/28/2021   VD25OH 77.4 08/25/2021   VD25OH 46.2 04/21/2021    Attestation Statements:   Reviewed by clinician on day of visit: allergies, medications, problem list, medical history, surgical history, family history, social history, and previous encounter notes.  ***(delete if time-based billing not used)  Time spent on visit including the items listed below was *** minutes.  -preparing to see the patient (e.g., review of tests, history, previous notes) -obtaining and/or reviewing separately obtained history -counseling and educating the patient/family/caregiver -documenting clinical information in the electronic or other health record -ordering medications, tests, or  procedures -independently interpreting results and communicating results to the patient/ family/caregiver -referring and communicating with other health care professionals  -care coordination

## 2022-05-03 ENCOUNTER — Telehealth (INDEPENDENT_AMBULATORY_CARE_PROVIDER_SITE_OTHER): Payer: Medicare HMO | Admitting: Family Medicine

## 2022-05-04 ENCOUNTER — Encounter (INDEPENDENT_AMBULATORY_CARE_PROVIDER_SITE_OTHER): Payer: Self-pay

## 2022-05-17 ENCOUNTER — Ambulatory Visit (INDEPENDENT_AMBULATORY_CARE_PROVIDER_SITE_OTHER): Payer: Medicare HMO | Admitting: Nurse Practitioner

## 2022-05-26 ENCOUNTER — Ambulatory Visit (INDEPENDENT_AMBULATORY_CARE_PROVIDER_SITE_OTHER): Payer: Medicare HMO | Admitting: Nurse Practitioner

## 2022-05-26 ENCOUNTER — Encounter (INDEPENDENT_AMBULATORY_CARE_PROVIDER_SITE_OTHER): Payer: Self-pay | Admitting: Nurse Practitioner

## 2022-05-26 VITALS — BP 113/73 | HR 74 | Temp 98.5°F | Ht 59.0 in | Wt 183.0 lb

## 2022-05-26 DIAGNOSIS — E559 Vitamin D deficiency, unspecified: Secondary | ICD-10-CM

## 2022-05-26 DIAGNOSIS — R7303 Prediabetes: Secondary | ICD-10-CM | POA: Diagnosis not present

## 2022-05-26 DIAGNOSIS — Z6837 Body mass index (BMI) 37.0-37.9, adult: Secondary | ICD-10-CM

## 2022-05-26 DIAGNOSIS — E669 Obesity, unspecified: Secondary | ICD-10-CM | POA: Diagnosis not present

## 2022-05-26 MED ORDER — VITAMIN D (ERGOCALCIFEROL) 1.25 MG (50000 UNIT) PO CAPS: 50000.0000 [IU] | ORAL_CAPSULE | ORAL | 0 refills | Status: DC

## 2022-05-30 NOTE — Progress Notes (Signed)
Chief Complaint:   OBESITY Darlene [name] is here to discuss her progress with her obesity treatment plan along with follow-up of her obesity related diagnoses. Darlene [name] is on keeping a food journal and adhering to recommended goals of 900 calories and 80 protein and states she is following her eating plan approximately 70% of the time. Darlene [name] states she is doing squats, riding her stationary bike, and walking 15  minutes 3 times per week.  Today's visit was #: 47 Starting weight: 213 lbs Starting date: 06/04/2019 Today's weight: 183 lbs Today's date: 05/26/2022 Total lbs lost to date: 30 lbs Total lbs lost since last in-office visit: +2 lbs  Interim History: was seen last by Darlene [name] on 04/19/2022.  Last visit in office was 03/01/2022.  Has not been following the plan, wants to eat "what she wants to eat".  "Has not gone too far off track"  Feels that she has to have a carb with her meals.  Reports hunger and creavings for ice cream.   Calories:  1200-1250 Protein:  42 grams  Subjective:   1. Vitamin D deficiency She is currently taking prescription vitamin D 50,000 IU every 14 days. She denies nausea, vomiting or muscle weakness. She felt better when she was taking Vitamin D weekly.   2. Pre-diabetes She reports she is tired of taking Ozempic.  Would like to discuss bariatric surgery at next office visit. She is taking Ozempic 1 mg daily.  Reports side effects of constipation.   Assessment/Plan:   1. Vitamin D deficiency Refill - Vitamin D, Ergocalciferol, (DRISDOL) 1.25 MG (50000 UNIT) CAPS capsule; Take 1 capsule (50,000 Units total) by mouth every 14 (fourteen) days. Saturdays.  Dispense: 4 capsule; Refill: 0  2. Pre-diabetes Will continue Ozempic at this time.  Will re-discuss at next visit.   3. Obesity: Current BMI 37.0 Handouts:  100-200 calorie snack sheet, IC and labs at next office visit.   Darlene [name] is currently in the action stage of change. As such, her goal  is to continue with weight loss efforts. She has agreed to keeping a food journal and adhering to recommended goals of [phone] calories and 80 protein.   Exercise goals:  plans to add another day of exercise.   Behavioral modification strategies: increasing lean protein intake, increasing vegetables, and increasing water intake.  Darlene [name] has agreed to follow-up with our clinic in 2 weeks. She was informed of the importance of frequent follow-up visits to maximize her success with intensive lifestyle modifications for her multiple health conditions.   Objective:   Blood pressure 113/73, pulse 74, temperature 98.5 F (36.9 C), height 4\' 11"  (1.499 m), weight 183 lb (83 kg), SpO2 100 %. Body mass index is 36.96 kg/m.  General: Cooperative, alert, well developed, in no acute distress. HEENT: Conjunctivae and lids unremarkable. Cardiovascular: Regular rhythm.  Lungs: Normal work of breathing. Neurologic: No focal deficits.   Lab Results  Component Value Date   CREATININE 0.84 12/28/2021   BUN 16 12/28/2021   NA 145 (H) 12/28/2021   K 4.0 12/28/2021   CL 104 12/28/2021   CO2 24 12/28/2021   Lab Results  Component Value Date   ALT 9 12/28/2021   AST 13 12/28/2021   ALKPHOS 119 12/28/2021   BILITOT 0.3 12/28/2021   Lab Results  Component Value Date   HGBA1C 5.5 12/28/2021   HGBA1C 5.8 (H) 08/25/2021   HGBA1C 5.5 03/10/2021   HGBA1C 5.8 (H) 11/03/2020   HGBA1C 5.9 (  H) 06/23/2020   Lab Results  Component Value Date   INSULIN 12.3 12/28/2021   INSULIN 8.4 08/25/2021   INSULIN 5.8 03/10/2021   INSULIN 9.6 11/03/2020   INSULIN 10.5 06/23/2020   Lab Results  Component Value Date   TSH 1.620 06/04/2019   Lab Results  Component Value Date   CHOL 226 (H) 12/28/2021   HDL 76 12/28/2021   LDLCALC 129 (H) 12/28/2021   TRIG 123 12/28/2021   CHOLHDL 2.2 03/10/2021   Lab Results  Component Value Date   VD25OH 58.4 12/28/2021   VD25OH 77.4 08/25/2021   VD25OH 46.2  04/21/2021   Lab Results  Component Value Date   WBC 6.0 12/28/2021   HGB 12.8 12/28/2021   HCT 40.9 12/28/2021   MCV 85 12/28/2021   PLT 368 12/28/2021   No results found for: "IRON", "TIBC", "FERRITIN"  Obesity Behavioral Intervention:   Approximately 15 minutes were spent on the discussion below.  ASK: We discussed the diagnosis of obesity with Darlene [name] today and Darlene [name] agreed to give [place] permission to discuss obesity behavioral modification therapy today.  ASSESS: Darlene [name] has the diagnosis of obesity and her BMI today is 37.0. Darlene [name] is in the action stage of change.   ADVISE: Darlene [name] was educated on the multiple health risks of obesity as well as the benefit of weight loss to improve her health. She was advised of the need for long term treatment and the importance of lifestyle modifications to improve her current health and to decrease her risk of future health problems.  AGREE: Multiple dietary modification options and treatment options were discussed and Darlene [name] agreed to follow the recommendations documented in the above note.  ARRANGE: Darlene [name] was educated on the importance of frequent visits to treat obesity as outlined per CMS and USPSTF guidelines and agreed to schedule her next follow up appointment today.  Attestation Statements:   Reviewed by clinician on day of visit: allergies, medications, problem list, medical history, surgical history, family history, social history, and previous encounter notes.  I, Malcolm Metro, RMA, am acting as transcriptionist for Darlene Limbo, [name]  I have reviewed the above documentation for accuracy and completeness, and I agree with the above. Darlene Limbo, [name]

## 2022-05-31 DIAGNOSIS — H11441 Conjunctival cysts, right eye: Secondary | ICD-10-CM | POA: Diagnosis not present

## 2022-05-31 DIAGNOSIS — H04123 Dry eye syndrome of bilateral lacrimal glands: Secondary | ICD-10-CM | POA: Diagnosis not present

## 2022-05-31 DIAGNOSIS — H1131 Conjunctival hemorrhage, right eye: Secondary | ICD-10-CM | POA: Diagnosis not present

## 2022-05-31 DIAGNOSIS — H5203 Hypermetropia, bilateral: Secondary | ICD-10-CM | POA: Diagnosis not present

## 2022-06-01 DIAGNOSIS — H52209 Unspecified astigmatism, unspecified eye: Secondary | ICD-10-CM | POA: Diagnosis not present

## 2022-06-01 DIAGNOSIS — H5203 Hypermetropia, bilateral: Secondary | ICD-10-CM | POA: Diagnosis not present

## 2022-06-01 DIAGNOSIS — H524 Presbyopia: Secondary | ICD-10-CM | POA: Diagnosis not present

## 2022-06-09 ENCOUNTER — Ambulatory Visit (INDEPENDENT_AMBULATORY_CARE_PROVIDER_SITE_OTHER): Payer: Medicare HMO | Admitting: Nurse Practitioner

## 2022-06-09 ENCOUNTER — Encounter (INDEPENDENT_AMBULATORY_CARE_PROVIDER_SITE_OTHER): Payer: Self-pay | Admitting: Nurse Practitioner

## 2022-06-09 VITALS — BP 113/81 | HR 75 | Temp 98.5°F | Ht 59.0 in | Wt 183.0 lb

## 2022-06-09 DIAGNOSIS — E559 Vitamin D deficiency, unspecified: Secondary | ICD-10-CM | POA: Diagnosis not present

## 2022-06-09 DIAGNOSIS — E669 Obesity, unspecified: Secondary | ICD-10-CM

## 2022-06-09 DIAGNOSIS — Z6837 Body mass index (BMI) 37.0-37.9, adult: Secondary | ICD-10-CM

## 2022-06-09 DIAGNOSIS — R0609 Other forms of dyspnea: Secondary | ICD-10-CM

## 2022-06-09 DIAGNOSIS — R7303 Prediabetes: Secondary | ICD-10-CM | POA: Diagnosis not present

## 2022-06-09 DIAGNOSIS — E7849 Other hyperlipidemia: Secondary | ICD-10-CM

## 2022-06-09 DIAGNOSIS — Z6841 Body Mass Index (BMI) 40.0 and over, adult: Secondary | ICD-10-CM

## 2022-06-09 MED ORDER — SEMAGLUTIDE (1 MG/DOSE) 4 MG/3ML ~~LOC~~ SOPN: 1.0000 mg | PEN_INJECTOR | SUBCUTANEOUS | 0 refills | Status: DC

## 2022-06-10 LAB — COMPREHENSIVE METABOLIC PANEL
ALT: 8 IU/L (ref 0–32)
AST: 15 IU/L (ref 0–40)
Albumin/Globulin Ratio: 1.8 (ref 1.2–2.2)
Albumin: 4.7 g/dL (ref 3.9–4.9)
Alkaline Phosphatase: 121 IU/L (ref 44–121)
BUN/Creatinine Ratio: 23 (ref 12–28)
BUN: 17 mg/dL (ref 8–27)
Bilirubin Total: 0.2 mg/dL (ref 0.0–1.2)
CO2: 23 mmol/L (ref 20–29)
Calcium: 9.7 mg/dL (ref 8.7–10.3)
Chloride: 102 mmol/L (ref 96–106)
Creatinine, Ser: 0.75 mg/dL (ref 0.57–1.00)
Globulin, Total: 2.6 g/dL (ref 1.5–4.5)
Glucose: 79 mg/dL (ref 70–99)
Potassium: 4.4 mmol/L (ref 3.5–5.2)
Sodium: 141 mmol/L (ref 134–144)
Total Protein: 7.3 g/dL (ref 6.0–8.5)
eGFR: 86 mL/min/{1.73_m2} (ref >59)

## 2022-06-10 LAB — INSULIN, RANDOM: INSULIN: 11.5 u[IU]/mL (ref 2.6–24.9)

## 2022-06-10 LAB — LIPID PANEL WITH LDL/HDL RATIO
Cholesterol, Total: 223 mg/dL — ABNORMAL HIGH (ref 100–199)
HDL: 75 mg/dL (ref >39)
LDL Chol Calc (NIH): 128 mg/dL — ABNORMAL HIGH (ref 0–99)
LDL/HDL Ratio: 1.7 ratio (ref 0.0–3.2)
Triglycerides: 114 mg/dL (ref 0–149)
VLDL Cholesterol Cal: 20 mg/dL (ref 5–40)

## 2022-06-10 LAB — VITAMIN D 25 HYDROXY (VIT D DEFICIENCY, FRACTURES): Vit D, 25-Hydroxy: 43 ng/mL (ref 30.0–100.0)

## 2022-06-10 LAB — HEMOGLOBIN A1C
Est. average glucose Bld gHb Est-mCnc: 114 mg/dL
Hgb A1c MFr Bld: 5.6 % (ref 4.8–5.6)

## 2022-06-13 NOTE — Progress Notes (Signed)
Chief Complaint:   OBESITY Darlene [name] is here to discuss her progress with her obesity treatment plan along with follow-up of her obesity related diagnoses. [name] is on keeping a food journal and adhering to recommended goals of [phone] calories and 80 grams of protein and states she is following her eating plan approximately 80% of the time. [name] states she is walking, stationary bike 20 minutes 3 times per week.  Today's visit was #: 3 Starting weight: 213 lbs Starting date: 06/04/2019 Today's weight: 183 lbs Today's date: 06/09/2022 Total lbs lost to date: 30 lbs Total lbs lost since last in-office visit: 0  Interim History: Calories: 1150+ protein: 80 grams. Darlene [name] struggles with hunger and is craving sweets. Eating more fruit and drinking more water. Breakfast: protein oatmeal and [employer] milk. Lunch: sandwich, avocado veggies, cheese or salad or soup. Dinner: protein, veggies and carb. She is not weighing or measuring food.  Subjective:   1. Pre-diabetes Darlene [name] is taking Ozempic 1 mg. Denies any side effects. She has decided that she would like to continue taking Ozempic. Sometimes struggles with not wanting to eat. Occasional side effects of constipation.   2. Vitamin D deficiency Darlene [name] is currently taking prescription Vit D 50,000 IU once a week.   3. Other hyperlipidemia Darlene [name] has hyperlipidemia and has been trying to improve her cholesterol levels with intensive lifestyle modifications including a low saturated fat diet, exercise, and weight loss. She denies any chest pain, claudication, or myalgias.  4. Dyspnea on exertion Darlene [name]'s last IC 1120, on 04/01/20. Today's IC 1411.  Assessment/Plan:   1. Pre-diabetes We will obtain labs today. We will Refill Ozempic 1 mg once weekly for 3 months with 0 refills.  -Refill Semaglutide, 1 MG/DOSE, 4 MG/3ML SOPN; Inject 1 mg as directed once a week.  Dispense: 9 mL; Refill: 0  - Comprehensive metabolic panel -  Hemoglobin A1c - VITAMIN D 25 Hydroxy (Vit-D Deficiency, Fractures) - Insulin, random  2. Vitamin D deficiency We will obtain labs today. Low Vitamin D level contributes to fatigue and are associated with obesity, breast, and colon cancer. She agrees to continue to take prescription Vitamin D @50 ,000 IU every week and will follow-up for routine testing of Vitamin D, at least 2-3 times per year to avoid over-replacement.   - Comprehensive metabolic panel  3. Other hyperlipidemia We will obtain labs today. Cardiovascular risk and specific lipid/LDL goals reviewed.  We discussed several lifestyle modifications today and Darlene [name] will continue to work on diet, exercise and weight loss efforts. Orders and follow up as documented in patient record.   Counseling Intensive lifestyle modifications are the first line treatment for this issue. Dietary changes: Increase soluble fiber. Decrease simple carbohydrates. Exercise changes: Moderate to vigorous-intensity aerobic activity 150 minutes per week if tolerated. Lipid-lowering medications: see documented in medical record.   - Comprehensive metabolic panel - Lipid Panel With LDL/HDL Ratio  4. Dyspnea on exertion Improving continue working on eating more protein increase water + movement.  5. Obesity: Current BMI 37.1 Shantiqua is currently in the action stage of change. As such, her goal is to continue with weight loss efforts. She has agreed to keeping a food journal and adhering to recommended goals of 1200 calories and 80+ grams of protein.   Exercise goals: As is.  Behavioral modification strategies: increasing lean protein intake, increasing vegetables, and increasing water intake.  Darlene [name] has agreed to follow-up with our clinic in 4 weeks. She was informed of the importance of  frequent follow-up visits to maximize her success with intensive lifestyle modifications for her multiple health conditions.   [name] was informed we would discuss  her lab results at her next visit unless there is a critical issue that needs to be addressed sooner. Darlene [name] agreed to keep her next visit at the agreed upon time to discuss these results.  Objective:   Blood pressure 113/81, pulse 75, temperature 98.5 F (36.9 C), height 4\' 11"  (1.499 m), weight 183 lb (83 kg), SpO2 93 %. Body mass index is 36.96 kg/m.  General: Cooperative, alert, well developed, in no acute distress. HEENT: Conjunctivae and lids unremarkable. Cardiovascular: Regular rhythm.  Lungs: Normal work of breathing. Neurologic: No focal deficits.   Lab Results  Component Value Date   CREATININE 0.75 06/09/2022   BUN 17 06/09/2022   NA 141 06/09/2022   K 4.4 06/09/2022   CL 102 06/09/2022   CO2 23 06/09/2022   Lab Results  Component Value Date   ALT 8 06/09/2022   AST 15 06/09/2022   ALKPHOS 121 06/09/2022   BILITOT 0.2 06/09/2022   Lab Results  Component Value Date   HGBA1C 5.6 06/09/2022   HGBA1C 5.5 12/28/2021   HGBA1C 5.8 (H) 08/25/2021   HGBA1C 5.5 03/10/2021   HGBA1C 5.8 (H) 11/03/2020   Lab Results  Component Value Date   INSULIN 11.5 06/09/2022   INSULIN 12.3 12/28/2021   INSULIN 8.4 08/25/2021   INSULIN 5.8 03/10/2021   INSULIN 9.6 11/03/2020   Lab Results  Component Value Date   TSH 1.620 06/04/2019   Lab Results  Component Value Date   CHOL 223 (H) 06/09/2022   HDL 75 06/09/2022   LDLCALC 128 (H) 06/09/2022   TRIG 114 06/09/2022   CHOLHDL 2.2 03/10/2021   Lab Results  Component Value Date   VD25OH 43.0 06/09/2022   VD25OH 58.4 12/28/2021   VD25OH 77.4 08/25/2021   Lab Results  Component Value Date   WBC 6.0 12/28/2021   HGB 12.8 12/28/2021   HCT 40.9 12/28/2021   MCV 85 12/28/2021   PLT 368 12/28/2021   No results found for: "IRON", "TIBC", "FERRITIN"  Attestation Statements:   Reviewed by clinician on day of visit: allergies, medications, problem list, medical history, surgical history, family history, social history,  and previous encounter notes.  I, Brendell Tyus, RMA, am acting as transcriptionist for Everardo Pacific, FNP.  I have reviewed the above documentation for accuracy and completeness, and I agree with the above. Everardo Pacific, FNP

## 2022-06-16 DIAGNOSIS — Z23 Encounter for immunization: Secondary | ICD-10-CM | POA: Diagnosis not present

## 2022-06-16 DIAGNOSIS — I10 Essential (primary) hypertension: Secondary | ICD-10-CM | POA: Diagnosis not present

## 2022-06-16 DIAGNOSIS — R7303 Prediabetes: Secondary | ICD-10-CM | POA: Diagnosis not present

## 2022-06-23 LAB — GLUCOSE, POCT (MANUAL RESULT ENTRY): POC Glucose: 102 mg/dl — AB (ref 70–99)

## 2022-07-07 ENCOUNTER — Ambulatory Visit (INDEPENDENT_AMBULATORY_CARE_PROVIDER_SITE_OTHER): Payer: Medicare HMO | Admitting: Family Medicine

## 2022-07-13 DIAGNOSIS — E669 Obesity, unspecified: Secondary | ICD-10-CM | POA: Diagnosis not present

## 2022-07-13 DIAGNOSIS — I10 Essential (primary) hypertension: Secondary | ICD-10-CM | POA: Diagnosis not present

## 2022-07-13 DIAGNOSIS — Z6837 Body mass index (BMI) 37.0-37.9, adult: Secondary | ICD-10-CM | POA: Diagnosis not present

## 2022-07-13 DIAGNOSIS — R7303 Prediabetes: Secondary | ICD-10-CM | POA: Diagnosis not present

## 2022-08-10 DIAGNOSIS — R7303 Prediabetes: Secondary | ICD-10-CM | POA: Diagnosis not present

## 2022-08-10 DIAGNOSIS — Z6837 Body mass index (BMI) 37.0-37.9, adult: Secondary | ICD-10-CM | POA: Diagnosis not present

## 2022-08-10 DIAGNOSIS — I10 Essential (primary) hypertension: Secondary | ICD-10-CM | POA: Diagnosis not present

## 2022-08-15 ENCOUNTER — Other Ambulatory Visit (INDEPENDENT_AMBULATORY_CARE_PROVIDER_SITE_OTHER): Payer: Self-pay | Admitting: Nurse Practitioner

## 2022-08-15 DIAGNOSIS — E559 Vitamin D deficiency, unspecified: Secondary | ICD-10-CM

## 2022-08-18 ENCOUNTER — Other Ambulatory Visit (INDEPENDENT_AMBULATORY_CARE_PROVIDER_SITE_OTHER): Payer: Self-pay | Admitting: Nurse Practitioner

## 2022-08-18 DIAGNOSIS — R7303 Prediabetes: Secondary | ICD-10-CM

## 2022-08-23 ENCOUNTER — Encounter (INDEPENDENT_AMBULATORY_CARE_PROVIDER_SITE_OTHER): Payer: Self-pay | Admitting: Family Medicine

## 2022-08-23 ENCOUNTER — Telehealth (INDEPENDENT_AMBULATORY_CARE_PROVIDER_SITE_OTHER): Payer: Medicare HMO | Admitting: Family Medicine

## 2022-08-23 DIAGNOSIS — E669 Obesity, unspecified: Secondary | ICD-10-CM

## 2022-08-23 DIAGNOSIS — Z6837 Body mass index (BMI) 37.0-37.9, adult: Secondary | ICD-10-CM | POA: Diagnosis not present

## 2022-08-23 DIAGNOSIS — R7303 Prediabetes: Secondary | ICD-10-CM | POA: Diagnosis not present

## 2022-08-23 DIAGNOSIS — E785 Hyperlipidemia, unspecified: Secondary | ICD-10-CM

## 2022-08-23 DIAGNOSIS — E7849 Other hyperlipidemia: Secondary | ICD-10-CM

## 2022-08-23 DIAGNOSIS — E559 Vitamin D deficiency, unspecified: Secondary | ICD-10-CM | POA: Diagnosis not present

## 2022-08-23 MED ORDER — SEMAGLUTIDE (1 MG/DOSE) 4 MG/3ML ~~LOC~~ SOPN: 1.0000 mg | PEN_INJECTOR | SUBCUTANEOUS | 0 refills | Status: DC

## 2022-08-23 MED ORDER — VITAMIN D (ERGOCALCIFEROL) 1.25 MG (50000 UNIT) PO CAPS: 50000.0000 [IU] | ORAL_CAPSULE | ORAL | 0 refills | Status: DC

## 2022-08-23 NOTE — Progress Notes (Signed)
TeleHealth Visit:  This visit was completed with telemedicine (audio/video) technology. Darlene [name] has verbally consented to this TeleHealth visit. The patient is located at home, the provider is located at home. The participants in this visit include the listed provider and patient. The visit was conducted today via MyChart video.  OBESITY Darlene [name] is here to discuss her progress with her obesity treatment plan along with follow-up of her obesity related diagnoses.   Today's visit was # 49 Starting weight: 213 lbs Starting date: 06/04/2019 Weight at last in office visit: 183 lbs on 06/09/22 Total weight loss: 30 lbs at last in office visit on 06/09/22. Today's reported weight: 189 lbs   Nutrition Plan: keeping a food journal and adhering to recommended goals of 1200 calories and 80+ grams of protein. .   Current exercise: [facility] 3 times week (walks and weight machines) 45 minutes; stationary bike 15 minutes 2 times per week.  Interim History: Darlene [name] has not had a follow-up visit for 11 weeks.  She reports she is seeing a dietitian with [facility] Lily Lovings, RD).  She feels this has been helpful.  The RD has been giving her some recipe and food ideas.  RD told her to eat 1500 cal/day. Darlene [name] reports low appetite at times and says she often does not even eat 1200 cal/day.  She says she averages about 50 g of protein per day.  She journals most days. IC was repeated last office visit-RMR is 1411.  Weight has been plateaued for almost 2 years. She often gets bored with foods and tires of tracking her intake. Activity level is very good. Assessment/Plan:  We discussed recent lab results in depth.  1. Prediabetes A1c was 5.6 on 06/09/2022.  Denies polyphagia Medication(s): Ozempic 1 mg weekly.  She inquired about increasing the dose. Lab Results  Component Value Date   HGBA1C 5.6 06/09/2022   Lab Results  Component Value Date   INSULIN 11.5 06/09/2022   INSULIN 12.3  12/28/2021   INSULIN 8.4 08/25/2021   INSULIN 5.8 03/10/2021   INSULIN 9.6 11/03/2020    Plan: Advised that increasing the dose is not a good idea since she lacks hunger to eat prescribed calories. Refill Ozempic 1 mg weekly.   2. Hyperlipidemia LDL is not at goal.  Lipid panel on 06/09/2022: LDL elevated at 128, HDL normal at 75, triglycerides normal at 114. 10-year ASCVD risk score is 10.8%. Medication(s): Pravastatin 10 mg daily.  However she does not take this every day.  Reports she is taking it more often than she used to. Cardiovascular risk factors: advanced age (older than 73 for men, 34 for women), dyslipidemia, hypertension, and obesity (BMI >= 30 kg/m2)  Lab Results  Component Value Date   CHOL 223 (H) 06/09/2022   HDL 75 06/09/2022   LDLCALC 128 (H) 06/09/2022   TRIG 114 06/09/2022   CHOLHDL 2.2 03/10/2021   Lab Results  Component Value Date   ALT 8 06/09/2022   AST 15 06/09/2022   ALKPHOS 121 06/09/2022   BILITOT 0.2 06/09/2022   The 10-year ASCVD risk score (Arnett DK, et al., 2019) is: 10.8%   Values used to calculate the score:     Age: 71 years     Sex: Female     Is Non-Hispanic African American: Yes     Diabetic: No     Tobacco smoker: No     Systolic Blood Pressure: 118 mmHg     Is BP treated: Yes  HDL Cholesterol: 75 mg/dL     Total Cholesterol: 223 mg/dL  Plan: Suggested increase in statin dose but she declined. Continue pravastatin 10 mg daily.    3. Vitamin D Deficiency Vitamin D is not at goal of 50.  Vitamin D level decreased from 58-43.  She has been taking the high-dose vitamin D weekly.  However she has been out of it for a while.  It was prescribed for every 14 days.  Lab Results  Component Value Date   VD25OH 43.0 06/09/2022   VD25OH 58.4 12/28/2021   VD25OH 77.4 08/25/2021    Plan: Increase frequency to weekly. Refill prescription vitamin D 50,000 IU weekly.   Obesity: Current BMI 37 Darlene [name] is currently in the action  stage of change. As such, her goal is to continue with weight loss efforts.  She has agreed to keeping a food journal and adhering to recommended goals of 1200 calories and 70-80 gms protein daily.   She will provide RD with RMR results. Discussed  plant-based options for protein. I will try to send my notes to RD.  Exercise goals:  as is  Behavioral modification strategies: increasing lean protein intake, meal planning and cooking strategies, planning for success, and keeping a strict food journal.  Darlene [name] has agreed to follow-up with our clinic in 4 weeks.   No orders of the defined types were placed in this encounter.   Medications Discontinued During This Encounter  Medication Reason   Vitamin D, Ergocalciferol, (DRISDOL) 1.25 MG (50000 UNIT) CAPS capsule Reorder   Semaglutide, 1 MG/DOSE, 4 MG/3ML SOPN Reorder   Vitamin D, Ergocalciferol, (DRISDOL) 1.25 MG (50000 UNIT) CAPS capsule Reorder     Meds ordered this encounter  Medications   DISCONTD: Vitamin D, Ergocalciferol, (DRISDOL) 1.25 MG (50000 UNIT) CAPS capsule    Sig: Take 1 capsule (50,000 Units total) by mouth every 14 (fourteen) days. Saturdays.    Dispense:  4 capsule    Refill:  0    Order Specific Question:   Supervising Provider    Answer:   Glennis Brink [[id number]]   Semaglutide, 1 MG/DOSE, 4 MG/3ML SOPN    Sig: Inject 1 mg as directed once a week.    Dispense:  9 mL    Refill:  0    3 month supply    Order Specific Question:   Supervising Provider    Answer:   Glennis Brink [[id number]]   Vitamin D, Ergocalciferol, (DRISDOL) 1.25 MG (50000 UNIT) CAPS capsule    Sig: Take 1 capsule (50,000 Units total) by mouth every 7 (seven) days. Saturdays.    Dispense:  12 capsule    Refill:  0    Order Specific Question:   Supervising Provider    Answer:   Glennis Brink [[id number]]      Objective:   VITALS: Per patient if applicable, see vitals. GENERAL: Alert and in no acute distress. CARDIOPULMONARY: No increased WOB.  Speaking in clear sentences.  PSYCH: Pleasant and cooperative. Speech normal rate and rhythm. Affect is appropriate. Insight and judgement are appropriate. Attention is focused, linear, and appropriate.  NEURO: Oriented as arrived to appointment on time with no prompting.   Lab Results  Component Value Date   CREATININE 0.75 06/09/2022   BUN 17 06/09/2022   NA 141 06/09/2022   K 4.4 06/09/2022   CL 102 06/09/2022   CO2 23 06/09/2022   Lab Results  Component Value Date   ALT 8 06/09/2022  AST 15 06/09/2022   ALKPHOS 121 06/09/2022   BILITOT 0.2 06/09/2022   Lab Results  Component Value Date   HGBA1C 5.6 06/09/2022   HGBA1C 5.5 12/28/2021   HGBA1C 5.8 (H) 08/25/2021   HGBA1C 5.5 03/10/2021   HGBA1C 5.8 (H) 11/03/2020   Lab Results  Component Value Date   INSULIN 11.5 06/09/2022   INSULIN 12.3 12/28/2021   INSULIN 8.4 08/25/2021   INSULIN 5.8 03/10/2021   INSULIN 9.6 11/03/2020   Lab Results  Component Value Date   TSH 1.620 06/04/2019   Lab Results  Component Value Date   CHOL 223 (H) 06/09/2022   HDL 75 06/09/2022   LDLCALC 128 (H) 06/09/2022   TRIG 114 06/09/2022   CHOLHDL 2.2 03/10/2021   Lab Results  Component Value Date   WBC 6.0 12/28/2021   HGB 12.8 12/28/2021   HCT 40.9 12/28/2021   MCV 85 12/28/2021   PLT 368 12/28/2021   No results found for: "IRON", "TIBC", "FERRITIN" Lab Results  Component Value Date   VD25OH 43.0 06/09/2022   VD25OH 58.4 12/28/2021   VD25OH 77.4 08/25/2021    Attestation Statements:   Reviewed by clinician on day of visit: allergies, medications, problem list, medical history, surgical history, family history, social history, and previous encounter notes.

## 2022-08-30 ENCOUNTER — Ambulatory Visit (INDEPENDENT_AMBULATORY_CARE_PROVIDER_SITE_OTHER): Payer: Medicare HMO | Admitting: Family Medicine

## 2022-09-20 NOTE — Progress Notes (Signed)
TeleHealth Visit:  This visit was completed with telemedicine (audio/video) technology. Srinidhi has verbally consented to this TeleHealth visit. The patient is located at home, the provider is located at home. The participants in this visit include the listed provider and patient. The visit was conducted today via MyChart video.  OBESITY Darlene [name] is here to discuss her progress with her obesity treatment plan along with follow-up of her obesity related diagnoses.   Today's visit was # 50 Starting weight: 213 lbs Starting date: 06/04/2019 Weight at last in office visit: 183 lbs on 06/09/22 Total weight loss: 30 lbs at last in office visit on 06/09/22. Reported weight last virtual visit on 08/23/22: 189 pounds Today's reported weight: 183 lbs   Nutrition Plan: PCSC   Interim History:  She is eating very little due to nausea.  She has been trying to eat bland foods and occasionally some chicken.  She is drinking Gatorade and water -about 40 ounces per day. She has lost 6 pounds.  Has not had a visit with her dietitian recently. Assessment/Plan:  1. Hyperlipidemia LDL is not at goal.  Last LDL was elevated at 128.  94-WNIO ASCVD risk score is 10.8%. She stopped taking pravastatin recently and says she feels better.  She has never liked taking a statin.  She wants to talk to her primary care provider about whether she needs medication.  Lab Results  Component Value Date   CHOL 223 (H) 06/09/2022   HDL 75 06/09/2022   LDLCALC 128 (H) 06/09/2022   TRIG 114 06/09/2022   CHOLHDL 2.2 03/10/2021   Lab Results  Component Value Date   ALT 8 06/09/2022   AST 15 06/09/2022   ALKPHOS 121 06/09/2022   BILITOT 0.2 06/09/2022   The 10-year ASCVD risk score (Arnett DK, et al., 2019) is: 10.8%   Values used to calculate the score:     Age: 62 years     Sex: Female     Is Non-Hispanic African American: Yes     Diabetic: No     Tobacco smoker: No     Systolic Blood Pressure: 118  mmHg     Is BP treated: Yes     HDL Cholesterol: 75 mg/dL     Total Cholesterol: 223 mg/dL  Plan: She will consult with PCP about hyperlipidemia.  2. Nausea She had a GI virus about 3 weeks ago with nausea and vomiting that lasted 4 days.  Since then she has had a headache and continued nausea.  Appetite is poor.  Plan: Make appointment to follow-up with PCP regarding this within the next week. Increase Gatorade/water to 64 ounces daily. Follow a bland diet.  3. Prediabetes Has had nausea and poor appetite recently.  Has continued on Ozempic 1 mg weekly. Lab Results  Component Value Date   HGBA1C 5.6 06/09/2022   Lab Results  Component Value Date   INSULIN 11.5 06/09/2022   INSULIN 12.3 12/28/2021   INSULIN 8.4 08/25/2021   INSULIN 5.8 03/10/2021   INSULIN 9.6 11/03/2020    Plan: Reduce Ozempic dose to 0.5 mg weekly (36 clicks on the 1 mg pen).   4. Vitamin D Deficiency Vitamin D is not at goal of 50.  Last vitamin D level was low at 43 on 06/09/2022. She is on weekly prescription Vitamin D 50,000 IU.  Lab Results  Component Value Date   VD25OH 43.0 06/09/2022   VD25OH 58.4 12/28/2021   VD25OH 77.4 08/25/2021    Plan: Refill prescription vitamin  D 50,000 IU weekly.   5. Obesity: Current BMI 37 Emri is currently in the action stage of change. As such, her goal is to continue with weight loss efforts.  She has agreed to practicing portion control and making smarter food choices, such as increasing vegetables and decreasing simple carbohydrates.   Follow a bland diet and work on incorporating some protein such as chicken.  Exercise goals: No exercise has been prescribed at this time.  Behavioral modification strategies: meal planning and cooking strategies and planning for success.  Chenee has agreed to follow-up with our clinic in 5 weeks.   No orders of the defined types were placed in this encounter.   Medications Discontinued During This Encounter   Medication Reason   pravastatin (PRAVACHOL) 10 MG tablet Side effect (s)   Vitamin D, Ergocalciferol, (DRISDOL) 1.25 MG (50000 UNIT) CAPS capsule Reorder     Meds ordered this encounter  Medications   Vitamin D, Ergocalciferol, (DRISDOL) 1.25 MG (50000 UNIT) CAPS capsule    Sig: Take 1 capsule (50,000 Units total) by mouth every 7 (seven) days. Saturdays.    Dispense:  12 capsule    Refill:  0    Order Specific Question:   Supervising Provider    Answer:   Glennis Brink [[id number]]      Objective:   VITALS: Per patient if applicable, see vitals. GENERAL: Alert and in no acute distress. CARDIOPULMONARY: No increased WOB. Speaking in clear sentences.  PSYCH: Pleasant and cooperative. Speech normal rate and rhythm. Affect is appropriate. Insight and judgement are appropriate. Attention is focused, linear, and appropriate.  NEURO: Oriented as arrived to appointment on time with no prompting.   Lab Results  Component Value Date   CREATININE 0.75 06/09/2022   BUN 17 06/09/2022   NA 141 06/09/2022   K 4.4 06/09/2022   CL 102 06/09/2022   CO2 23 06/09/2022   Lab Results  Component Value Date   ALT 8 06/09/2022   AST 15 06/09/2022   ALKPHOS 121 06/09/2022   BILITOT 0.2 06/09/2022   Lab Results  Component Value Date   HGBA1C 5.6 06/09/2022   HGBA1C 5.5 12/28/2021   HGBA1C 5.8 (H) 08/25/2021   HGBA1C 5.5 03/10/2021   HGBA1C 5.8 (H) 11/03/2020   Lab Results  Component Value Date   INSULIN 11.5 06/09/2022   INSULIN 12.3 12/28/2021   INSULIN 8.4 08/25/2021   INSULIN 5.8 03/10/2021   INSULIN 9.6 11/03/2020   Lab Results  Component Value Date   TSH 1.620 06/04/2019   Lab Results  Component Value Date   CHOL 223 (H) 06/09/2022   HDL 75 06/09/2022   LDLCALC 128 (H) 06/09/2022   TRIG 114 06/09/2022   CHOLHDL 2.2 03/10/2021   Lab Results  Component Value Date   WBC 6.0 12/28/2021   HGB 12.8 12/28/2021   HCT 40.9 12/28/2021   MCV 85 12/28/2021   PLT 368 12/28/2021    No results found for: "IRON", "TIBC", "FERRITIN" Lab Results  Component Value Date   VD25OH 43.0 06/09/2022   VD25OH 58.4 12/28/2021   VD25OH 77.4 08/25/2021    Attestation Statements:   Reviewed by clinician on day of visit: allergies, medications, problem list, medical history, surgical history, family history, social history, and previous encounter notes.

## 2022-09-21 ENCOUNTER — Telehealth (INDEPENDENT_AMBULATORY_CARE_PROVIDER_SITE_OTHER): Payer: Medicare HMO | Admitting: Family Medicine

## 2022-09-21 ENCOUNTER — Encounter (INDEPENDENT_AMBULATORY_CARE_PROVIDER_SITE_OTHER): Payer: Self-pay | Admitting: Family Medicine

## 2022-09-21 VITALS — Ht 59.0 in | Wt 183.0 lb

## 2022-09-21 DIAGNOSIS — E559 Vitamin D deficiency, unspecified: Secondary | ICD-10-CM

## 2022-09-21 DIAGNOSIS — R110 Nausea: Secondary | ICD-10-CM

## 2022-09-21 DIAGNOSIS — Z6837 Body mass index (BMI) 37.0-37.9, adult: Secondary | ICD-10-CM | POA: Diagnosis not present

## 2022-09-21 DIAGNOSIS — E7849 Other hyperlipidemia: Secondary | ICD-10-CM

## 2022-09-21 DIAGNOSIS — E785 Hyperlipidemia, unspecified: Secondary | ICD-10-CM

## 2022-09-21 DIAGNOSIS — E669 Obesity, unspecified: Secondary | ICD-10-CM

## 2022-09-21 DIAGNOSIS — R7303 Prediabetes: Secondary | ICD-10-CM | POA: Diagnosis not present

## 2022-09-21 MED ORDER — VITAMIN D (ERGOCALCIFEROL) 1.25 MG (50000 UNIT) PO CAPS: 50000.0000 [IU] | ORAL_CAPSULE | ORAL | 0 refills | Status: DC

## 2022-09-28 DIAGNOSIS — I10 Essential (primary) hypertension: Secondary | ICD-10-CM | POA: Diagnosis not present

## 2022-09-28 DIAGNOSIS — R7303 Prediabetes: Secondary | ICD-10-CM | POA: Diagnosis not present

## 2022-09-29 DIAGNOSIS — Z03818 Encounter for observation for suspected exposure to other biological agents ruled out: Secondary | ICD-10-CM | POA: Diagnosis not present

## 2022-09-29 DIAGNOSIS — R059 Cough, unspecified: Secondary | ICD-10-CM | POA: Diagnosis not present

## 2022-09-29 DIAGNOSIS — R6889 Other general symptoms and signs: Secondary | ICD-10-CM | POA: Diagnosis not present

## 2022-09-29 DIAGNOSIS — R0981 Nasal congestion: Secondary | ICD-10-CM | POA: Diagnosis not present

## 2022-09-29 DIAGNOSIS — R051 Acute cough: Secondary | ICD-10-CM | POA: Diagnosis not present

## 2022-09-29 DIAGNOSIS — R52 Pain, unspecified: Secondary | ICD-10-CM | POA: Diagnosis not present

## 2022-10-04 DIAGNOSIS — E7800 Pure hypercholesterolemia, unspecified: Secondary | ICD-10-CM | POA: Diagnosis not present

## 2022-10-04 DIAGNOSIS — I10 Essential (primary) hypertension: Secondary | ICD-10-CM | POA: Diagnosis not present

## 2022-10-04 LAB — BASIC METABOLIC PANEL
BUN: 17 (ref 4–21)
CO2: 29 — AB (ref 13–22)
Chloride: 104 (ref 99–108)
Creatinine: 0.8 (ref 0.5–1.1)
Glucose: 91
Potassium: 4.5 mEq/L (ref 3.5–5.1)
Sodium: 140 (ref 137–147)

## 2022-10-04 LAB — HEPATIC FUNCTION PANEL
ALT: 9 U/L (ref 7–35)
AST: 16 (ref 13–35)

## 2022-10-04 LAB — COMPREHENSIVE METABOLIC PANEL
Albumin: 4.4 (ref 3.5–5.0)
Calcium: 10.1 (ref 8.7–10.7)
eGFR: 80

## 2022-10-04 LAB — LIPID PANEL
Cholesterol: 237 — AB (ref 0–200)
HDL: 70 (ref 35–70)
LDL Cholesterol: 145
LDl/HDL Ratio: 3.4
Triglycerides: 127 (ref 40–160)

## 2022-10-06 DIAGNOSIS — I10 Essential (primary) hypertension: Secondary | ICD-10-CM | POA: Diagnosis not present

## 2022-10-06 DIAGNOSIS — R42 Dizziness and giddiness: Secondary | ICD-10-CM | POA: Diagnosis not present

## 2022-10-06 DIAGNOSIS — E7800 Pure hypercholesterolemia, unspecified: Secondary | ICD-10-CM | POA: Diagnosis not present

## 2022-10-06 DIAGNOSIS — R7303 Prediabetes: Secondary | ICD-10-CM | POA: Diagnosis not present

## 2022-10-27 NOTE — Progress Notes (Signed)
TeleHealth Visit:  This visit was completed with telemedicine (audio/video) technology. Darlene [name] has verbally consented to this TeleHealth visit. The patient is located at home, the provider is located at home. The participants in this visit include the listed provider and patient. The visit was conducted today via phone call.  Unsuccessful attempt was made to connect to video. Length of call was 31 minutes.   OBESITY Darlene [name] is here to discuss her progress with her obesity treatment plan along with follow-up of her obesity related diagnoses.    Today's visit was # 12  Starting weight: 213 lbs Starting date: 06/04/2019 Weight reported at last virtual office visit: 183 lbs on 09/21/22 Today's reported weight: 186 lbs  Total weight loss: 27 Weight change since last visit: +3  Nutrition Plan: practicing portion control and making smarter food choices, such as increasing vegetables and decreasing simple carbohydrates.   Current exercise: elliptical and weight machines twice weekly at [facility]  Interim History: [name] is frustrated with lack of weight loss.  Her weight has been plateaued around 185 for 2 years.  She does get tired of journaling and watching what she eats without results. She reports she does not eat that much.  She has been eating soups, salads, beans, salmon, fish, berries.  She is not currently tracking calories/protein. She has been [facility] and hopes to increase the frequency of this. She sees a dietitian with [facility] but is unsure if it is beneficial.  Assessment/Plan:  1. Prediabetes Last A1c that I have available is 5.6 on 06/09/2022.  She had labs done recently at PCP. Medication(s): Ozempic 1 mg weekly.  Tolerating well. Lab Results  Component Value Date   HGBA1C 5.6 06/09/2022   Lab Results  Component Value Date   INSULIN 11.5 06/09/2022   INSULIN 12.3 12/28/2021   INSULIN 8.4 08/25/2021   INSULIN 5.8  03/10/2021   INSULIN 9.6 11/03/2020    Plan: Continue Ozempic 1 mg weekly-no refill needed. Will obtain labs done recently at PCP.   2.  Obesity: Current BMI 37 Darlene [name] is currently in the action stage of change. As such, her goal is to continue with weight loss efforts.  She has agreed to keeping a food journal and adhering to recommended goals of 1100 calories and 70 grams protein and practicing portion control and making smarter food choices, such as increasing vegetables and decreasing simple carbohydrates.   1.  Discussed that more frequent exercise will give her an edge on weight loss. 2.  The discussed the calorie/protein benefit of meat over beans for protein.  Exercise goals: Increase frequency of exercise to 3 times per week.  Behavioral modification strategies: increasing lean protein intake, decreasing simple carbohydrates, meal planning and cooking strategies, and planning for success.  [name] has agreed to follow-up with our clinic in 4 weeks.   No orders of the defined types were placed in this encounter.   There are no discontinued medications.   No orders of the defined types were placed in this encounter.     Objective:   VITALS: Per patient if applicable, see vitals. GENERAL: Alert and in no acute distress. CARDIOPULMONARY: No increased WOB. Speaking in clear sentences.  PSYCH: Pleasant and cooperative. Speech normal rate and rhythm. Affect is appropriate. Insight and judgement are appropriate. Attention is focused, linear, and appropriate.  NEURO: Oriented as arrived to appointment on time with no prompting.   Lab Results  Component Value Date   CREATININE  0.75 06/09/2022   BUN 17 06/09/2022   NA 141 06/09/2022   K 4.4 06/09/2022   CL 102 06/09/2022   CO2 23 06/09/2022   Lab Results  Component Value Date   ALT 8 06/09/2022   AST 15 06/09/2022   ALKPHOS 121 06/09/2022   BILITOT 0.2 06/09/2022   Lab Results  Component Value Date   HGBA1C 5.6  06/09/2022   HGBA1C 5.5 12/28/2021   HGBA1C 5.8 (H) 08/25/2021   HGBA1C 5.5 03/10/2021   HGBA1C 5.8 (H) 11/03/2020   Lab Results  Component Value Date   INSULIN 11.5 06/09/2022   INSULIN 12.3 12/28/2021   INSULIN 8.4 08/25/2021   INSULIN 5.8 03/10/2021   INSULIN 9.6 11/03/2020   Lab Results  Component Value Date   TSH 1.620 06/04/2019   Lab Results  Component Value Date   CHOL 223 (H) 06/09/2022   HDL 75 06/09/2022   LDLCALC 128 (H) 06/09/2022   TRIG 114 06/09/2022   CHOLHDL 2.2 03/10/2021   Lab Results  Component Value Date   WBC 6.0 12/28/2021   HGB 12.8 12/28/2021   HCT 40.9 12/28/2021   MCV 85 12/28/2021   PLT 368 12/28/2021   No results found for: "IRON", "TIBC", "FERRITIN" Lab Results  Component Value Date   VD25OH 43.0 06/09/2022   VD25OH 58.4 12/28/2021   VD25OH 77.4 08/25/2021    Attestation Statements:   Reviewed by clinician on day of visit: allergies, medications, problem list, medical history, surgical history, family history, social history, and previous encounter notes.

## 2022-10-31 ENCOUNTER — Telehealth (INDEPENDENT_AMBULATORY_CARE_PROVIDER_SITE_OTHER): Payer: Self-pay | Admitting: Family Medicine

## 2022-10-31 ENCOUNTER — Encounter (INDEPENDENT_AMBULATORY_CARE_PROVIDER_SITE_OTHER): Payer: Self-pay | Admitting: Family Medicine

## 2022-10-31 ENCOUNTER — Telehealth (INDEPENDENT_AMBULATORY_CARE_PROVIDER_SITE_OTHER): Payer: 59 | Admitting: Family Medicine

## 2022-10-31 VITALS — Ht 59.0 in | Wt 186.0 lb

## 2022-10-31 DIAGNOSIS — Z6837 Body mass index (BMI) 37.0-37.9, adult: Secondary | ICD-10-CM

## 2022-10-31 DIAGNOSIS — R7303 Prediabetes: Secondary | ICD-10-CM

## 2022-10-31 DIAGNOSIS — E669 Obesity, unspecified: Secondary | ICD-10-CM | POA: Diagnosis not present

## 2022-10-31 NOTE — Telephone Encounter (Signed)
Patient was a no-show for virtual visit.  Called patient.  Unable to leave message.

## 2022-11-01 ENCOUNTER — Encounter: Payer: Self-pay | Admitting: Family Medicine

## 2022-11-21 ENCOUNTER — Telehealth (INDEPENDENT_AMBULATORY_CARE_PROVIDER_SITE_OTHER): Payer: 59 | Admitting: Family Medicine

## 2022-12-01 NOTE — Progress Notes (Signed)
TeleHealth Visit:  This visit was completed with telemedicine (audio/video) technology. Raynah has verbally consented to this TeleHealth visit. The patient is located at home, the provider is located at home. The participants in this visit include the listed provider and patient. The visit was conducted today via MyChart video.  OBESITY Cassondra is here to discuss her progress with her obesity treatment plan along with follow-up of her obesity related diagnoses.    Today's visit was # 63  Starting weight: 213 lbs Starting date: 06/04/2019 Weight reported at last virtual office visit: 186 lbs on 10/31/22 Today's reported weight: 186 lbs  Total weight loss: 27 Weight change since last visit: 0  Nutrition Plan: practicing portion control and making smarter food choices, such as increasing vegetables and decreasing simple carbohydrates   Current exercise:  none  Interim History: She journaled a few times and saw that when she exceeded 1100/day she gained weight.  She realizes how easy it is to eat more than 1100 cal/day. She thinks she would like to switch to Cat 1.  Has not been to [facility] for exercise since Feb 21 due to transportation issues.  She is looking for a more reliable car.  Feels she is getting in adequate protein.  On Ozempic 1 mg weekly and appetite is well-controlled.  Eating all of the prescribed protein: yes  Skipping meals: yes Drinking adequate water: Yes Drinking sugar sweetened beverages: No Hunger controlled: well controlled. Cravings controlled:  well controlled.   Assessment/Plan:  1. Prediabetes Last A1c was 5.6   Medication(s):  Ozempic 1 mg SQ weekly. Has good appetite control. No side effects.  Lab Results  Component Value Date   HGBA1C 5.6 06/09/2022   HGBA1C 5.5 12/28/2021   HGBA1C 5.8 (H) 08/25/2021   HGBA1C 5.5 03/10/2021   HGBA1C 5.8 (H) 11/03/2020   Lab Results  Component Value Date   INSULIN 11.5 06/09/2022   INSULIN 12.3  12/28/2021   INSULIN 8.4 08/25/2021   INSULIN 5.8 03/10/2021   INSULIN 9.6 11/03/2020    Plan: Continue Ozempic 1 mg weekly. Check A1c and fasting insulin before next office visit.   2. Vitamin D Deficiency Vitamin D is at goal of 50.  Most recent vitamin D level was 43.0. She is on  prescription ergocalciferol 50,000 IU weekly. Lab Results  Component Value Date   VD25OH 43.0 06/09/2022   VD25OH 58.4 12/28/2021   VD25OH 77.4 08/25/2021    Plan: Refill prescription vitamin D 50,000 IU weekly.   3. Morbid Obesity: Current BMI 37  Kajal is currently in the action stage of change. As such, her goal is to continue with weight loss efforts.  She has agreed to the Category 1 plan.  She will try to be very consistent with following category 1 until her next visit.  Exercise goals: Go to the gym 3-4 days/week and do cardio/resistance training.  Behavioral modification strategies: increasing lean protein intake, decreasing simple carbohydrates , and meal planning .  Leianne has agreed to follow-up with our clinic in 3 weeks.   Orders Placed This Encounter  Procedures   Hemoglobin A1c   Insulin, random   VITAMIN D 25 Hydroxy (Vit-D Deficiency, Fractures)    There are no discontinued medications.   No orders of the defined types were placed in this encounter.     Objective:   VITALS: Per patient if applicable, see vitals. GENERAL: Alert and in no acute distress. CARDIOPULMONARY: No increased WOB. Speaking in clear sentences.  PSYCH:  Pleasant and cooperative. Speech normal rate and rhythm. Affect is appropriate. Insight and judgement are appropriate. Attention is focused, linear, and appropriate.  NEURO: Oriented as arrived to appointment on time with no prompting.   Attestation Statements:   Reviewed by clinician on day of visit: allergies, medications, problem list, medical history, surgical history, family history, social history, and previous encounter  notes.   This was prepared with the assistance of [employer].  Occasional wrong-word or sound-a-like substitutions may have occurred due to the inherent limitations of voice recognition software.

## 2022-12-05 ENCOUNTER — Encounter (INDEPENDENT_AMBULATORY_CARE_PROVIDER_SITE_OTHER): Payer: Self-pay | Admitting: Family Medicine

## 2022-12-05 ENCOUNTER — Telehealth (INDEPENDENT_AMBULATORY_CARE_PROVIDER_SITE_OTHER): Payer: 59 | Admitting: Family Medicine

## 2022-12-05 VITALS — Ht 59.0 in | Wt 186.0 lb

## 2022-12-05 DIAGNOSIS — E559 Vitamin D deficiency, unspecified: Secondary | ICD-10-CM

## 2022-12-05 DIAGNOSIS — R7303 Prediabetes: Secondary | ICD-10-CM | POA: Diagnosis not present

## 2022-12-05 DIAGNOSIS — Z6837 Body mass index (BMI) 37.0-37.9, adult: Secondary | ICD-10-CM

## 2022-12-19 ENCOUNTER — Telehealth: Payer: Self-pay

## 2022-12-19 ENCOUNTER — Telehealth (INDEPENDENT_AMBULATORY_CARE_PROVIDER_SITE_OTHER): Payer: Self-pay | Admitting: Family Medicine

## 2022-12-19 NOTE — Telephone Encounter (Signed)
Mychart message sent to patient.

## 2022-12-19 NOTE — Telephone Encounter (Signed)
Spoke with rep at [facility], stated a PA was needed and to complete through Cover My Meds.

## 2022-12-19 NOTE — Telephone Encounter (Signed)
PA submitted through Cover My Meds for Ozempic. Awaiting insurance determination.  Key: [place]

## 2022-12-19 NOTE — Telephone Encounter (Signed)
Sharee Pimple with [employer] called requesting a PA for Ozempic 1mg . Patient has requested [employer] to contact [employer] for a Prior [employer]. [facility].

## 2022-12-19 NOTE — Telephone Encounter (Signed)
Pt called stating that she needs a PA for her Ozempic. It is for the [employer] through [employer]. Pt needs a refill. Please call the pt at the number provided. [facility].

## 2022-12-20 ENCOUNTER — Telehealth (INDEPENDENT_AMBULATORY_CARE_PROVIDER_SITE_OTHER): Payer: Self-pay | Admitting: Family Medicine

## 2022-12-20 DIAGNOSIS — R7303 Prediabetes: Secondary | ICD-10-CM

## 2022-12-20 MED ORDER — METFORMIN HCL 500 MG PO TABS: 500.0000 mg | ORAL_TABLET | Freq: Every day | ORAL | 0 refills | Status: DC

## 2022-12-20 NOTE — Telephone Encounter (Signed)
Patient asked that I give her a call regarding her Ozempic.  Insurance has refused to cover it.  She would like to know what her options are.  We discussed metformin and she would like to try this. New prescription-metformin 500 mg with breakfast.

## 2022-12-20 NOTE — Telephone Encounter (Signed)
Patient stated she wanted to talk to someone about her Ozempic meds being denied. What are her next steps from here ? She is unsure why they approved it before but they are denying it now. Please give her a call as soon as possible.

## 2022-12-20 NOTE — Telephone Encounter (Signed)
Per [employer]: Drugs when used for anorexia, weight loss, or weight gain are excluded from coverage under Medicare rules. Please refer to your Evidence of Coverage (EOC) section that references Part D drug coverage in your pharmacy plan documents for more information.

## 2022-12-27 NOTE — Progress Notes (Addendum)
TeleHealth Visit:  This visit was completed with telemedicine (audio/video) technology. Darlene [name] has verbally consented to this TeleHealth visit. The patient is located at home, the provider is located at home. The participants in this visit include the listed provider and patient. The visit was conducted today via MyChart video.  OBESITY Darlene [name] is here to discuss her progress with her obesity treatment plan along with follow-up of her obesity related diagnoses.    Today's visit was # 53  Starting weight: 214 lbs Starting date: 06/04/19 Weight reported at last virtual office visit: 186 lbs on 12/05/22 Today's reported weight (12/28/22):  185 lbs Total weight loss: 29 Weight change since last visit: -1  Nutrition Plan: the Category 1 plan   Current exercise:  [facility] 3 days/week -cardio/resistance training (60 minutes).  Interim History:  She has been following cat 1 some days and making her own meal plan some days. She went off plan on Easter.  She journals consistently and calories run 1150-1200 and protein is 45-70 gms/day. She started back to the gym recently.   Assessment/Plan:  1. Prediabetes Last A1c was 5.3 on 12/15/2022 by a [place] healthcare visiting nurse.  Ozempic recently denied by insurance.   Medication(s): Ozempic 1 mg SQ weekly. Has one injection left.  I sent in a prescription for metformin and she plans on picking it up but she is unsure if she will take it. Lab Results  Component Value Date   HGBA1C 5.6 06/09/2022   HGBA1C 5.5 12/28/2021   HGBA1C 5.8 (H) 08/25/2021   HGBA1C 5.5 03/10/2021   HGBA1C 5.8 (H) 11/03/2020   Lab Results  Component Value Date   INSULIN 11.5 06/09/2022   INSULIN 12.3 12/28/2021   INSULIN 8.4 08/25/2021   INSULIN 5.8 03/10/2021   INSULIN 9.6 11/03/2020    Plan: I gave her the option to hold on starting the metformin until after her next A1c around the end of July.  2. Vitamin D Deficiency Vitamin D is not at goal  of 50.  Most recent vitamin D level was 43.Marland Kitchen She is on  prescription ergocalciferol 50,000 IU weekly. Lab Results  Component Value Date   VD25OH 43.0 06/09/2022   VD25OH 58.4 12/28/2021   VD25OH 77.4 08/25/2021    Plan: Continue and refill  prescription ergocalciferol 50,000 IU weekly Orders placed last visit her vitamin D level but she did not have labs drawn.   She will have labs drawn before the next visit.   3. Morbid Obesity: Current BMI 37  Darlene [name] is currently in the action stage of change. As such, her goal is to continue with weight loss efforts.  She has agreed to the Category 1 plan and keeping a food journal with goal of 1000-1100 calories and 70 grams of protein daily.  She may journal or follow category 1.  If she chooses to journal she should keep calories between 1000-1100 and get at least 70 g of protein.  Exercise goals:  as is  Behavioral modification strategies: increasing lean protein intake, decreasing simple carbohydrates , and keep a strict food journal.  Tkai has agreed to follow-up with our clinic in 3 weeks.   No orders of the defined types were placed in this encounter.   Medications Discontinued During This Encounter  Medication Reason   Semaglutide, 1 MG/DOSE, 4 MG/3ML SOPN Cost of medication   metFORMIN (GLUCOPHAGE) 500 MG tablet Change in therapy   Vitamin D, Ergocalciferol, (DRISDOL) 1.25 MG (50000 UNIT) CAPS capsule Reorder  Meds ordered this encounter  Medications   Vitamin D, Ergocalciferol, (DRISDOL) 1.25 MG (50000 UNIT) CAPS capsule    Sig: Take 1 capsule (50,000 Units total) by mouth every 7 (seven) days. Saturdays.    Dispense:  12 capsule    Refill:  0    Order Specific Question:   Supervising Provider    Answer:   Glennis Brink [[id number]]   metFORMIN (GLUCOPHAGE) 500 MG tablet    Sig: Take 1 tablet (500 mg total) by mouth daily with breakfast.    Dispense:  30 tablet    Refill:  0    Order Specific Question:   Supervising  Provider    Answer:   Glennis Brink [[id number]]      Objective:   VITALS: Per patient if applicable, see vitals. GENERAL: Alert and in no acute distress. CARDIOPULMONARY: No increased WOB. Speaking in clear sentences.  PSYCH: Pleasant and cooperative. Speech normal rate and rhythm. Affect is appropriate. Insight and judgement are appropriate. Attention is focused, linear, and appropriate.  NEURO: Oriented as arrived to appointment on time with no prompting.   Attestation Statements:   Reviewed by clinician on day of visit: allergies, medications, problem list, medical history, surgical history, family history, social history, and previous encounter notes.   This was prepared with the assistance of [employer].  Occasional wrong-word or sound-a-like substitutions may have occurred due to the inherent limitations of voice recognition software.

## 2022-12-28 ENCOUNTER — Telehealth (INDEPENDENT_AMBULATORY_CARE_PROVIDER_SITE_OTHER): Payer: 59 | Admitting: Family Medicine

## 2022-12-28 ENCOUNTER — Encounter (INDEPENDENT_AMBULATORY_CARE_PROVIDER_SITE_OTHER): Payer: Self-pay | Admitting: Family Medicine

## 2022-12-28 VITALS — Ht 59.0 in | Wt 185.0 lb

## 2022-12-28 DIAGNOSIS — Z6837 Body mass index (BMI) 37.0-37.9, adult: Secondary | ICD-10-CM | POA: Diagnosis not present

## 2022-12-28 DIAGNOSIS — R7303 Prediabetes: Secondary | ICD-10-CM

## 2022-12-28 DIAGNOSIS — E559 Vitamin D deficiency, unspecified: Secondary | ICD-10-CM | POA: Diagnosis not present

## 2022-12-28 MED ORDER — VITAMIN D (ERGOCALCIFEROL) 1.25 MG (50000 UNIT) PO CAPS: 50000.0000 [IU] | ORAL_CAPSULE | ORAL | 0 refills | Status: DC

## 2022-12-28 MED ORDER — METFORMIN HCL 500 MG PO TABS: 500.0000 mg | ORAL_TABLET | Freq: Every day | ORAL | 0 refills | Status: DC

## 2023-01-18 ENCOUNTER — Telehealth (INDEPENDENT_AMBULATORY_CARE_PROVIDER_SITE_OTHER): Payer: 59 | Admitting: Family Medicine

## 2023-01-24 NOTE — Progress Notes (Signed)
TeleHealth Visit:  This visit was completed with telemedicine (audio/video) technology. Darlene [name] has verbally consented to this TeleHealth visit. The patient is located at home, the provider is located at home. The participants in this visit include the listed provider and patient. The visit was conducted today via MyChart video.  OBESITY Darlene [name] is here to discuss her progress with her obesity treatment plan along with follow-up of her obesity related diagnoses.    Today's visit was # 54  Starting weight: 214 lbs Starting date: 06/04/19 Weight reported at last virtual office visit: 185 lbs on 12/28/22 Today's reported weight (01/25/23):  188 lbs  Total weight loss: 26 lbs Weight change since last visit: +3  Nutrition Plan: the Category 1 plan and keeping a food journal with goal of 1000-1100 calories and 70 grams of protein daily -  Current exercise:  [facility] 2 days/week -cardio/resistance training (60 minutes). But has not exercised this week.  Interim History:  Calories are overhang 1300 per day. Protein averages 45-55 gms per day. Breakfast: eggs and toast Lunch: chicken with a slice of bread.  Dinner: sometimes skips and then has ice cream later.  She feels not having Ozempic for the past 3 weeks has made it harder for her to keep calories at 1100/day.  Assessment/Plan:  1. Insomnia Darlene [name] admits to difficulty staying asleep. She gets too warm during the night.  Reports Nocturia and waking up to warm. Current Medication(s): none.  She does not want medication.  Plan: Plan: Recommend sleep hygiene measures including regular sleep schedule, optimal sleep environment, decreasing fluid intake in the evening. Check TSH.   2. Prediabetes Last A1c was 5.6  on 06/09/22.  Medication(s): Has decided not to take the metformin.  Ozempic no longer covered.  Lab Results  Component Value Date   HGBA1C 5.6 06/09/2022   HGBA1C 5.5 12/28/2021   HGBA1C 5.8 (H) 08/25/2021    HGBA1C 5.5 03/10/2021   HGBA1C 5.8 (H) 11/03/2020   Lab Results  Component Value Date   INSULIN 11.5 06/09/2022   INSULIN 12.3 12/28/2021   INSULIN 8.4 08/25/2021   INSULIN 5.8 03/10/2021   INSULIN 9.6 11/03/2020    Plan: Increase protein intake to at least 70 gms per day.  A1c, fasting insulin ordered.  She will go to office to have labs drawn before next appointment.   3. Vitamin D Deficiency Vitamin D is not at goal of 50.  Most recent vitamin D level was 43 on 06/09/2022.Marland Kitchen She is on  prescription ergocalciferol 50,000 IU weekly. Lab Results  Component Value Date   VD25OH 43.0 06/09/2022   VD25OH 58.4 12/28/2021   VD25OH 77.4 08/25/2021    Plan: Continue  prescription ergocalciferol 50,000 IU weekly Check vitamin D level.   4.  History of hypokalemia Taking 10 mEq of potassium daily.  Plan: Check complete metabolic panel.  Morbid Obesity: Current BMI 37  Darlene [name] is currently in the action stage of change. As such, her goal is to continue with weight loss efforts.  She has agreed to keeping a food journal with goal of 1000-1100 calories and 70 grams of protein daily.  1.  Discussed meal ideas such as 1 cup of cottage cheese and tuna.  Exercise goals: Exercise 5-6 days/week gym 2-3 days per week plus walking 3 days per week.   Behavioral modification strategies: increasing lean protein intake, decreasing simple carbohydrates , no meal skipping, meal planning , and planning for success.  Darlene [name] has agreed to follow-up with our clinic  in 3 weeks.   Orders Placed This Encounter  Procedures   Comprehensive metabolic panel   Hemoglobin A1c   Insulin, random   TSH   VITAMIN D 25 Hydroxy (Vit-D Deficiency, Fractures)    Medications Discontinued During This Encounter  Medication Reason   metFORMIN (GLUCOPHAGE) 500 MG tablet Patient Preference     No orders of the defined types were placed in this encounter.     Objective:   VITALS: Per patient if  applicable, see vitals. GENERAL: Alert and in no acute distress. CARDIOPULMONARY: No increased WOB. Speaking in clear sentences.  PSYCH: Pleasant and cooperative. Speech normal rate and rhythm. Affect is appropriate. Insight and judgement are appropriate. Attention is focused, linear, and appropriate.  NEURO: Oriented as arrived to appointment on time with no prompting.   Attestation Statements:   Reviewed by clinician on day of visit: allergies, medications, problem list, medical history, surgical history, family history, social history, and previous encounter notes.   This was prepared with the assistance of [employer].  Occasional wrong-word or sound-a-like substitutions may have occurred due to the inherent limitations of voice recognition software.

## 2023-01-25 ENCOUNTER — Telehealth (INDEPENDENT_AMBULATORY_CARE_PROVIDER_SITE_OTHER): Payer: 59 | Admitting: Family Medicine

## 2023-01-25 ENCOUNTER — Encounter (INDEPENDENT_AMBULATORY_CARE_PROVIDER_SITE_OTHER): Payer: Self-pay | Admitting: Family Medicine

## 2023-01-25 DIAGNOSIS — R7303 Prediabetes: Secondary | ICD-10-CM

## 2023-01-25 DIAGNOSIS — G4709 Other insomnia: Secondary | ICD-10-CM

## 2023-01-25 DIAGNOSIS — Z8639 Personal history of other endocrine, nutritional and metabolic disease: Secondary | ICD-10-CM

## 2023-01-25 DIAGNOSIS — G4700 Insomnia, unspecified: Secondary | ICD-10-CM

## 2023-01-25 DIAGNOSIS — E559 Vitamin D deficiency, unspecified: Secondary | ICD-10-CM

## 2023-01-25 DIAGNOSIS — Z6837 Body mass index (BMI) 37.0-37.9, adult: Secondary | ICD-10-CM

## 2023-01-29 ENCOUNTER — Other Ambulatory Visit (INDEPENDENT_AMBULATORY_CARE_PROVIDER_SITE_OTHER): Payer: Self-pay | Admitting: Family Medicine

## 2023-01-29 DIAGNOSIS — R7303 Prediabetes: Secondary | ICD-10-CM

## 2023-02-09 NOTE — Progress Notes (Signed)
  TeleHealth Visit:  This visit was completed with telemedicine (audio/video) technology. Darlene [name] has verbally consented to this TeleHealth visit. The patient is located at home, the provider is located at home. The participants in this visit include the listed provider and patient. The visit was conducted today via phone call.  Unsuccessful attempt was made to connect to video. Length of call was 19 minutes.   OBESITY Darlene [name] is here to discuss her progress with her obesity treatment plan along with follow-up of her obesity related diagnoses.    Today's visit was # 55  Starting weight: 214 lbs Starting date: 06/04/19 Weight reported at last virtual office visit: 188 lbs on 01/25/23 Today's reported weight (02/13/23):  191 lbs Total weight loss: 13 lbs Weight change since last visit: + 3  Nutrition Plan: keeping a food journal with goal of 1000-1100 calories and 70 grams of protein daily   Current exercise:  [facility] 1 days/week -cardio/resistance training (60 minutes)   Interim History:  She has been off track due to some personal stressors. She has been having an [employer] protein shake daily (24 gms of protein). Dinner is inconsistent- sometimes healthy, sometimes not. She wants to be more consistent with exercise. She has added in weight training. Last week she met her protein goals but not over the weekend. She is inconsistent with meeting goals - calories are over and protein is under.   Assessment/Plan:  1. Prediabetes Last A1c was 5.6.  Was on Ozempic until March when insurance denied coverage. Medication(s): None  Declines metformin.  Lab Results  Component Value Date   HGBA1C 5.6 06/09/2022   HGBA1C 5.5 12/28/2021   HGBA1C 5.8 (H) 08/25/2021   HGBA1C 5.5 03/10/2021   HGBA1C 5.8 (H) 11/03/2020   Lab Results  Component Value Date   INSULIN 11.5 06/09/2022   INSULIN 12.3 12/28/2021   INSULIN 8.4 08/25/2021   INSULIN 5.8 03/10/2021   INSULIN 9.6  11/03/2020    Plan: Work on  increasing protein and decreasing carbohydrates.   2. Generalized Obesity: Current BMI 38  Darlene [name] is currently in the action stage of change. As such, her goal is to continue with weight loss efforts.  She has agreed to keeping a food journal with goal of 1000-1100 calories and 70 grams of protein daily.  Encouraged her to be more consistent with exercise-at least 3 days/week. Encouraged better plan compliance.  Behavioral modification strategies: increasing lean protein intake, decreasing simple carbohydrates , and keep a strict food journal.  Darlene [name] has agreed to follow-up with our clinic in 5 weeks.   No orders of the defined types were placed in this encounter.   There are no discontinued medications.   No orders of the defined types were placed in this encounter.     Objective:   VITALS: Per patient if applicable, see vitals. GENERAL: Alert and in no acute distress. CARDIOPULMONARY: No increased WOB. Speaking in clear sentences.  PSYCH: Pleasant and cooperative. Speech normal rate and rhythm. Affect is appropriate. Insight and judgement are appropriate. Attention is focused, linear, and appropriate.  NEURO: Oriented as arrived to appointment on time with no prompting.   Attestation Statements:   Reviewed by clinician on day of visit: allergies, medications, problem list, medical history, surgical history, family history, social history, and previous encounter notes.   This was prepared with the assistance of [employer].  Occasional wrong-word or sound-a-like substitutions may have occurred due to the inherent limitations of voice recognition software. ;

## 2023-02-13 ENCOUNTER — Telehealth (INDEPENDENT_AMBULATORY_CARE_PROVIDER_SITE_OTHER): Payer: 59 | Admitting: Family Medicine

## 2023-02-13 VITALS — Ht 59.0 in | Wt 191.0 lb

## 2023-02-13 DIAGNOSIS — Z6838 Body mass index (BMI) 38.0-38.9, adult: Secondary | ICD-10-CM

## 2023-02-13 DIAGNOSIS — E669 Obesity, unspecified: Secondary | ICD-10-CM

## 2023-02-13 DIAGNOSIS — R7303 Prediabetes: Secondary | ICD-10-CM | POA: Diagnosis not present

## 2023-02-14 ENCOUNTER — Encounter (INDEPENDENT_AMBULATORY_CARE_PROVIDER_SITE_OTHER): Payer: Self-pay | Admitting: Family Medicine

## 2023-02-23 DIAGNOSIS — Z1231 Encounter for screening mammogram for malignant neoplasm of breast: Secondary | ICD-10-CM | POA: Diagnosis not present

## 2023-02-23 DIAGNOSIS — R92323 Mammographic fibroglandular density, bilateral breasts: Secondary | ICD-10-CM | POA: Diagnosis not present

## 2023-02-24 ENCOUNTER — Ambulatory Visit (INDEPENDENT_AMBULATORY_CARE_PROVIDER_SITE_OTHER): Payer: 59

## 2023-02-24 ENCOUNTER — Ambulatory Visit (INDEPENDENT_AMBULATORY_CARE_PROVIDER_SITE_OTHER): Payer: 59 | Admitting: Podiatry

## 2023-02-24 ENCOUNTER — Encounter: Payer: Self-pay | Admitting: Podiatry

## 2023-02-24 DIAGNOSIS — M7752 Other enthesopathy of left foot: Secondary | ICD-10-CM | POA: Diagnosis not present

## 2023-02-24 DIAGNOSIS — M76822 Posterior tibial tendinitis, left leg: Secondary | ICD-10-CM | POA: Diagnosis not present

## 2023-02-24 DIAGNOSIS — M7750 Other enthesopathy of unspecified foot: Secondary | ICD-10-CM

## 2023-02-24 MED ORDER — TRIAMCINOLONE ACETONIDE 10 MG/ML IJ SUSP
10.0000 mg | Freq: Once | INTRAMUSCULAR | Status: AC
Start: 2023-02-24 — End: 2023-02-24
  Administered 2023-02-24: 10 mg

## 2023-02-27 DIAGNOSIS — H25811 Combined forms of age-related cataract, right eye: Secondary | ICD-10-CM | POA: Diagnosis not present

## 2023-02-27 DIAGNOSIS — H25812 Combined forms of age-related cataract, left eye: Secondary | ICD-10-CM | POA: Diagnosis not present

## 2023-02-27 NOTE — Progress Notes (Signed)
Subjective:   Patient ID: Darlene [name], female   DOB: 72 y.o.   MRN: [id number]   HPI Patient presents with a lot of pain on the inside of the left ankle states it has been very sore and hard to walk with.  States she does not remember specific injury but it has been this way for a couple months and is worsened recently close of present moderate flatfoot does not smoke likes to be active   Review of Systems  All other systems reviewed and are negative.       Objective:  Physical Exam Vitals and nursing note reviewed.  Constitutional:      Appearance: She is well-developed.  Pulmonary:     Effort: Pulmonary effort is normal.  Musculoskeletal:        General: Normal range of motion.  Skin:    General: Skin is warm.  Neurological:     Mental Status: She is alert.     Neurovascular status found to be intact muscle strength found to be adequate range of motion adequate with patient noted to have inflammation pain of the left medial ankle as the posterior tibial tendon comes underneath the medial malleolus with mild discomfort up to the navicular.  I did not note loss of muscle function it appears to be inflammatory with moderate flatfoot deformity and patient was found to have good digital perfusion well-oriented x 3     Assessment:  Posterior tibial tendinitis left with inflammation secondary to probable walking with flatter type shoes for periods of time     Plan:  H&P reviewed x-rays taken reviewed today I went ahead and I did do sterile prep and injected carefully along the sheath posterior tibial 3 mg dexamethasone Kenalog 5 mg Xylocaine and placed patient into an air fracture walker to completely immobilize.  Gave instructions on ice therapy and discussed long-term orthotics or other treatment and when patient goes back into shoes in 3 weeks I want him to have good arch  X-rays indicate moderate depression of the arch no other indication of pathology

## 2023-03-09 DIAGNOSIS — I10 Essential (primary) hypertension: Secondary | ICD-10-CM | POA: Diagnosis not present

## 2023-03-09 DIAGNOSIS — Z0000 Encounter for general adult medical examination without abnormal findings: Secondary | ICD-10-CM | POA: Diagnosis not present

## 2023-03-09 DIAGNOSIS — Z23 Encounter for immunization: Secondary | ICD-10-CM | POA: Diagnosis not present

## 2023-03-09 DIAGNOSIS — Z1211 Encounter for screening for malignant neoplasm of colon: Secondary | ICD-10-CM | POA: Diagnosis not present

## 2023-03-09 DIAGNOSIS — E785 Hyperlipidemia, unspecified: Secondary | ICD-10-CM | POA: Diagnosis not present

## 2023-03-09 DIAGNOSIS — R7303 Prediabetes: Secondary | ICD-10-CM | POA: Diagnosis not present

## 2023-03-20 NOTE — Progress Notes (Deleted)
  TeleHealth Visit:  This visit was completed with telemedicine (audio/video) technology. Daphanie has verbally consented to this TeleHealth visit. The patient is located at home, the provider is located at home. The participants in this visit include the listed provider and patient. The visit was conducted today via MyChart video.  OBESITY Darlene [name] is here to discuss her progress with her obesity treatment plan along with follow-up of her obesity related diagnoses.    Today's visit was # 56  Starting weight: 214 lbs Starting date: 06/04/19 Weight reported at last virtual office visit: 188 lbs on 02/13/23 Today's reported weight (03/21/23): {dwwweightreported:29243} Weight at clinic on ***: *** lbs Total weight loss: *** lbs Weight change since last visit: *** lbs  Nutrition Plan: keeping a food journal with goal of 1000-1100 calories and 70 grams of protein daily - ***% adherence.  Current exercise: {exercise types:16438} [facility] 1 days/week -cardio/resistance training (60 minutes)   Interim History:  ***  Eating all of the prescribed protein: {yes***/no:17258} Skipping meals: {dwwyes:29172} Drinking adequate water: {dwwyes:29172} Drinking sugar sweetened beverages: {dwwyes:29172} Hunger controlled: {EWCONTROLASSESSMENT:24261}. Cravings controlled:  {EWCONTROLASSESSMENT:24261}.  Journaling Consistently:  {dwwyes:29172} Meeting protein goals:  {dwwyes:29172} Meeting calorie goals:  {dwwyes:29172}  Pharmacotherapy: Tonni is on {dwwglp:29109}. Adverse side effects: {dwwse:29122} Hunger is {EWCONTROLASSESSMENT:24261}.  Assessment/Plan:  1. ***  2. ***  3. ***  {dwwmorbid:29108::"Morbid Obesity"}: Current BMI ***  Pharmacotherapy Plan {dwwmed:29123} {dwwglp:29109}.   Rupal {CHL AMB IS/IS NOT:210130109} currently in the action stage of change. As such, her goal is to {MWMwtloss#1:210800005}.  She has agreed to {dwwsldiets:29085}.  Exercise goals: {MWM  EXERCISE RECS:23473}  Behavioral modification strategies: {dwwslwtlossstrategies:29088}.  Annalaura has agreed to follow-up with our clinic in {NUMBER 1-10:22536} {dwwfutime:29619}  No orders of the defined types were placed in this encounter.   There are no discontinued medications.   No orders of the defined types were placed in this encounter.     Objective:   VITALS: Per patient if applicable, see vitals. GENERAL: Alert and in no acute distress. CARDIOPULMONARY: No increased WOB. Speaking in clear sentences.  PSYCH: Pleasant and cooperative. Speech normal rate and rhythm. Affect is appropriate. Insight and judgement are appropriate. Attention is focused, linear, and appropriate.  NEURO: Oriented as arrived to appointment on time with no prompting.   Attestation Statements:   Reviewed by clinician on day of visit: allergies, medications, problem list, medical history, surgical history, family history, social history, and previous encounter notes.  ***(delete if time-based billing not used) Time spent on visit including the items listed below was *** minutes.  -preparing to see the patient (e.g., review of tests, history, previous notes) -obtaining and/or reviewing separately obtained history -counseling and educating the patient/family/caregiver -documenting clinical information in the electronic or other health record -ordering medications, tests, or procedures -independently interpreting results and communicating results to the patient/ family/caregiver -referring and communicating with other health care professionals  -care coordination    This was prepared with the assistance of [employer].  Occasional wrong-word or sound-a-like substitutions may have occurred due to the inherent limitations of voice recognition software.

## 2023-03-21 ENCOUNTER — Telehealth (INDEPENDENT_AMBULATORY_CARE_PROVIDER_SITE_OTHER): Payer: 59 | Admitting: Family Medicine

## 2023-03-23 DIAGNOSIS — H25812 Combined forms of age-related cataract, left eye: Secondary | ICD-10-CM | POA: Diagnosis not present

## 2023-03-24 ENCOUNTER — Other Ambulatory Visit (INDEPENDENT_AMBULATORY_CARE_PROVIDER_SITE_OTHER): Payer: Self-pay | Admitting: Family Medicine

## 2023-03-24 ENCOUNTER — Ambulatory Visit: Payer: 59 | Admitting: Podiatry

## 2023-03-24 DIAGNOSIS — E559 Vitamin D deficiency, unspecified: Secondary | ICD-10-CM

## 2023-03-25 ENCOUNTER — Other Ambulatory Visit (INDEPENDENT_AMBULATORY_CARE_PROVIDER_SITE_OTHER): Payer: Self-pay | Admitting: Family Medicine

## 2023-03-25 DIAGNOSIS — E559 Vitamin D deficiency, unspecified: Secondary | ICD-10-CM

## 2023-04-05 ENCOUNTER — Encounter: Payer: Self-pay | Admitting: Podiatry

## 2023-04-05 ENCOUNTER — Ambulatory Visit (INDEPENDENT_AMBULATORY_CARE_PROVIDER_SITE_OTHER): Payer: 59 | Admitting: Podiatry

## 2023-04-05 DIAGNOSIS — M76822 Posterior tibial tendinitis, left leg: Secondary | ICD-10-CM

## 2023-04-06 DIAGNOSIS — M810 Age-related osteoporosis without current pathological fracture: Secondary | ICD-10-CM | POA: Diagnosis not present

## 2023-04-06 DIAGNOSIS — M8588 Other specified disorders of bone density and structure, other site: Secondary | ICD-10-CM | POA: Diagnosis not present

## 2023-04-06 NOTE — Progress Notes (Signed)
Subjective:   Patient ID: Darlene [name], female   DOB: 72 y.o.   MRN: [id number]   HPI Patient states improving still having an area of discomfort but quite a bit better   ROS      Objective:  Physical Exam  Neurovascular status intact pain that still present but is improving with patient wearing a cam walker currently     Assessment:  Posterior tibial tendinitis left continuing to be present but improved with boot     Plan:  H&P reviewed I have recommended utilization of fascial brace which was given today to support the arch along with ice therapy supportive shoes and if symptoms do come back will require possible MRI possible surgical intervention

## 2023-04-11 NOTE — Progress Notes (Signed)
TeleHealth Visit:  This visit was completed with telemedicine (audio/video) technology. Darlene [name] has verbally consented to this TeleHealth visit. The patient is located at home, the provider is located at home. The participants in this visit include the listed provider and patient. The visit was conducted today via MyChart video.  Darlene [name] is here to discuss her progress with her Darlene treatment plan along with follow-up of her Darlene related diagnoses.    Today's visit was # 56  Starting weight: 214 lbs Starting date: 06/04/19 Weight reported at last virtual office visit: 191 lbs on 02/13/23 Today's reported weight (04/12/23):  197 lbs at PCP on 03/10/23. Total weight loss: 23 lbs Weight change since last visit: +6 lbs  Nutrition Plan: keeping a food journal with goal of 1000-1100 calories and 70 grams of protein daily   Current exercise:  [facility] 3 days/week -cardio/resistance training (60 minutes)   Interim History:  She has lost inches in hips/thighs/arms because she has been exercising. Eating a lot of fruit and protein. She has not been journaling or following a plan for the last few months. She has gained 6 lbs.  Assessment/Plan:  1. Prediabetes Last A1c was 5.7 at PCP. Medication(s): None Polyphagia:No Lab Results  Component Value Date   HGBA1C 5.6 06/09/2022   HGBA1C 5.5 12/28/2021   HGBA1C 5.8 (H) 08/25/2021   HGBA1C 5.5 03/10/2021   HGBA1C 5.8 (H) 11/03/2020   Lab Results  Component Value Date   INSULIN 11.5 06/09/2022   INSULIN 12.3 12/28/2021   INSULIN 8.4 08/25/2021   INSULIN 5.8 03/10/2021   INSULIN 9.6 11/03/2020    Plan: Declines metformin. Continue exercise. Resume journaling.    2. Hyperlipidemia  LDL is improved- down to 115 from 145. Labs done at PCP in June. Medication(s): pravastatin 10 mg. She is now taking this daily since May. Before she had only been taking it a few days per week.  Lab Results  Component Value  Date   CHOL 237 (A) 10/04/2022   HDL 70 10/04/2022   LDLCALC 145 10/04/2022   TRIG 127 10/04/2022   CHOLHDL 2.2 03/10/2021   CHOLHDL 3 03/01/2018   CHOLHDL 3.0 10/28/2016   Lab Results  Component Value Date   ALT 9 10/04/2022   AST 16 10/04/2022   ALKPHOS 121 06/09/2022   BILITOT 0.2 06/09/2022   The 10-year ASCVD risk score (Arnett DK, et al., 2019) is: 12.2%*   Values used to calculate the score:     Age: 72 years     Sex: Female     Is Non-Hispanic African American: Yes     Diabetic: No     Tobacco smoker: No     Systolic Blood Pressure: 118 mmHg     Is BP treated: Yes     HDL Cholesterol: 70 mg/dL*     Total Cholesterol: 237 mg/dL*     * - Cholesterol units were assumed for this score calculation  Plan: Continue statin daily. Continue exercise. Resume journaling.  3. Morbid Darlene: Current BMI 39  Darlene [name] is currently in the action stage of change. As such, her goal is to get back to weightloss efforts .  She has agreed to keeping a food journal with goal of 1000-1100 calories and 70 grams of protein daily.  Exercise goals:  as is  Behavioral modification strategies: increasing lean protein intake, decreasing simple carbohydrates , keep a strict food journal, and increase frequency of journaling.  Darlene [name] has agreed to follow-up with our clinic  in 8 weeks.  No orders of the defined types were placed in this encounter.   Medications Discontinued During This Encounter  Medication Reason   Vitamin D, Ergocalciferol, (DRISDOL) 1.25 MG (50000 UNIT) CAPS capsule Reorder     Meds ordered this encounter  Medications   Vitamin D, Ergocalciferol, (DRISDOL) 1.25 MG (50000 UNIT) CAPS capsule    Sig: Take 1 capsule (50,000 Units total) by mouth every 7 (seven) days.    Dispense:  12 capsule    Refill:  0    Order Specific Question:   Supervising Provider    Answer:   Glennis Brink [[id number]]      Objective:   VITALS: Per patient if applicable, see  vitals. GENERAL: Alert and in no acute distress. CARDIOPULMONARY: No increased WOB. Speaking in clear sentences.  PSYCH: Pleasant and cooperative. Speech normal rate and rhythm. Affect is appropriate. Insight and judgement are appropriate. Attention is focused, linear, and appropriate.  NEURO: Oriented as arrived to appointment on time with no prompting.   Attestation Statements:   Reviewed by clinician on day of visit: allergies, medications, problem list, medical history, surgical history, family history, social history, and previous encounter notes.   This was prepared with the assistance of [employer].  Occasional wrong-word or sound-a-like substitutions may have occurred due to the inherent limitations of voice recognition software.

## 2023-04-12 ENCOUNTER — Telehealth (INDEPENDENT_AMBULATORY_CARE_PROVIDER_SITE_OTHER): Payer: 59 | Admitting: Family Medicine

## 2023-04-12 ENCOUNTER — Encounter (INDEPENDENT_AMBULATORY_CARE_PROVIDER_SITE_OTHER): Payer: Self-pay | Admitting: Family Medicine

## 2023-04-12 VITALS — Ht 59.0 in | Wt 197.0 lb

## 2023-04-12 DIAGNOSIS — E785 Hyperlipidemia, unspecified: Secondary | ICD-10-CM

## 2023-04-12 DIAGNOSIS — R7303 Prediabetes: Secondary | ICD-10-CM

## 2023-04-12 DIAGNOSIS — E559 Vitamin D deficiency, unspecified: Secondary | ICD-10-CM | POA: Diagnosis not present

## 2023-04-12 DIAGNOSIS — Z6839 Body mass index (BMI) 39.0-39.9, adult: Secondary | ICD-10-CM

## 2023-04-12 MED ORDER — VITAMIN D (ERGOCALCIFEROL) 1.25 MG (50000 UNIT) PO CAPS
50000.0000 [IU] | ORAL_CAPSULE | ORAL | 0 refills | Status: AC
Start: 2023-04-12 — End: ?

## 2023-04-20 DIAGNOSIS — H25811 Combined forms of age-related cataract, right eye: Secondary | ICD-10-CM | POA: Diagnosis not present

## 2023-05-15 ENCOUNTER — Ambulatory Visit (INDEPENDENT_AMBULATORY_CARE_PROVIDER_SITE_OTHER): Payer: 59 | Admitting: Podiatry

## 2023-05-15 ENCOUNTER — Encounter: Payer: Self-pay | Admitting: Podiatry

## 2023-05-15 DIAGNOSIS — T148XXA Other injury of unspecified body region, initial encounter: Secondary | ICD-10-CM | POA: Diagnosis not present

## 2023-05-17 NOTE — Progress Notes (Signed)
Subjective:   Patient ID: Darlene [name], female   DOB: 72 y.o.   MRN: [id number]   HPI Patient states still having a lot of pain in her left ankle and that the boot is helpful when she wears it cannot wear all the time takes ibuprofen it is gotten worse again recently neuro   ROS      Objective:  Physical Exam  Vascular status intact with a lot of discomfort in the posterior tibial tendon as it comes under the medial malleolus and inserts into the navicular.  I did not note muscle strength loss but I am concerned about interstitial tear of the tendon given the swelling and the pain     Assessment:  Possibility for torn tendon left posterior tib at the insertion and proximal      Plan:  H&P and reviewed and at this point continue cam walker ice rest and I am sending for MRI to rule out tear or other pathology.  Patient will have this done we will get results and decide what might be necessary in this particular condition

## 2023-05-21 ENCOUNTER — Ambulatory Visit
Admission: RE | Admit: 2023-05-21 | Discharge: 2023-05-21 | Disposition: A | Discharge status: Home / Self Care | Payer: 59 | Source: Ambulatory Visit | Attending: Podiatry | Admitting: Podiatry

## 2023-05-21 DIAGNOSIS — T148XXA Other injury of unspecified body region, initial encounter: Secondary | ICD-10-CM

## 2023-05-21 DIAGNOSIS — M25572 Pain in left ankle and joints of left foot: Secondary | ICD-10-CM | POA: Diagnosis not present

## 2023-05-25 ENCOUNTER — Telehealth: Payer: Self-pay | Admitting: Urology

## 2023-05-25 NOTE — Telephone Encounter (Signed)
error 

## 2023-06-02 ENCOUNTER — Encounter: Payer: Self-pay | Admitting: Podiatry

## 2023-06-02 ENCOUNTER — Ambulatory Visit (INDEPENDENT_AMBULATORY_CARE_PROVIDER_SITE_OTHER): Payer: 59 | Admitting: Podiatry

## 2023-06-02 DIAGNOSIS — T148XXA Other injury of unspecified body region, initial encounter: Secondary | ICD-10-CM

## 2023-06-02 DIAGNOSIS — M76822 Posterior tibial tendinitis, left leg: Secondary | ICD-10-CM

## 2023-06-04 NOTE — Progress Notes (Signed)
Subjective:   Patient ID: Darlene [name], female   DOB: 72 y.o.   MRN: [id number]   HPI Patient's MRI has not come back left yet   ROS      Objective:  Physical Exam  Patient is wearing boot but still having pain left     Assessment:  Possibility for tear of the posterior tibial tendon left     Plan:  Waiting for results and then we will determine what will be best for this patient

## 2023-06-05 NOTE — Progress Notes (Signed)
Do you want this one

## 2023-06-07 NOTE — Progress Notes (Signed)
Schedule this patient next week for consult post tib repair. Let me know if any issues

## 2023-06-12 ENCOUNTER — Ambulatory Visit: Payer: 59 | Admitting: Podiatry

## 2023-06-12 NOTE — Progress Notes (Signed)
Confirm she is on your schedule to see. I talked to her

## 2023-06-13 ENCOUNTER — Telehealth: Payer: Self-pay | Admitting: Podiatry

## 2023-06-13 NOTE — Telephone Encounter (Signed)
Called pt and she said we have the wrong Mrs Boys it must be her daughter. I think she was confused.

## 2023-06-14 ENCOUNTER — Ambulatory Visit (INDEPENDENT_AMBULATORY_CARE_PROVIDER_SITE_OTHER): Payer: 59 | Admitting: Podiatry

## 2023-06-14 DIAGNOSIS — Z01818 Encounter for other preprocedural examination: Secondary | ICD-10-CM | POA: Diagnosis not present

## 2023-06-14 DIAGNOSIS — M898X9 Other specified disorders of bone, unspecified site: Secondary | ICD-10-CM

## 2023-06-14 DIAGNOSIS — M76822 Posterior tibial tendinitis, left leg: Secondary | ICD-10-CM | POA: Diagnosis not present

## 2023-06-14 NOTE — Progress Notes (Signed)
Subjective:  Patient ID: Darlene [name], female    DOB: 1950/10/08,  MRN: [id number]  Chief Complaint  Patient presents with   Foot Pain    72 y.o. female presents with the above complaint.  Patient presents with complaint of left posterior tibial tendinitis with insertional pain.  Patient has been treated by Dr. Charlsie Merles with injection immobilization none of which has helped.  She had an MRI scheduled and she is here to go over the results.  She has tried all conservative care and has failed them at this time she would like to discuss surgical options.   Review of Systems: Negative except as noted in the HPI. Denies N/V/F/Ch.  Past Medical History:  Diagnosis Date   Anxiety    Cataracts, bilateral    md just watching    Constipation    Depression    Elevated LFTs    history of, normal abdominal ultrasound, LFT's normal 01/2007   Gallbladder problem    History of chest pain    rulre out MI in 12/2006.   Hyperlipidemia    diet controlled   Hypertension    Obesity    Osteoarthritis of both knees 12/26/2007   Xray of knee 10/2007: Bilateral osteoarthritis, sent to PT but did not complete tx course.     Osteopenia 07/05/2015   Prediabetes 11/30/2015    Current Outpatient Medications:    acetaminophen (TYLENOL) 500 MG tablet, Take 1,000 mg by mouth every 8 (eight) hours as needed for moderate pain., Disp: , Rfl:    Biotin w/ Vitamins C & E (HAIR SKIN & NAILS GUMMIES PO), Take 2 capsules by mouth every other day., Disp: , Rfl:    Efinaconazole 10 % SOLN, Apply 1 drop topically daily. (Patient taking differently: Apply 1 drop topically every 30 (thirty) days.), Disp: 4 mL, Rfl: 11   hydrochlorothiazide (HYDRODIURIL) 12.5 MG tablet, Take 12.5 mg by mouth daily., Disp: , Rfl:    HYDROcodone-acetaminophen (NORCO/VICODIN) 5-325 MG tablet, Take 1-2 tablets by mouth every 6 (six) hours as needed for severe pain., Disp: 42 tablet, Rfl: 0   Magnesium 400 MG TABS, Take 400 mg by mouth in  the morning., Disp: , Rfl:    potassium chloride (KLOR-CON) 10 MEQ tablet, Take 10 mEq by mouth every evening., Disp: , Rfl:    pravastatin (PRAVACHOL) 10 MG tablet, Take by mouth., Disp: , Rfl:    vitamin B-12 (CYANOCOBALAMIN) 1000 MCG tablet, Take 1,000 mcg by mouth every other day. In the morning, Disp: , Rfl:    Vitamin D, Ergocalciferol, (DRISDOL) 1.25 MG (50000 UNIT) CAPS capsule, Take 1 capsule (50,000 Units total) by mouth every 7 (seven) days., Disp: 12 capsule, Rfl: 0  Social History   Tobacco Use  Smoking Status Never  Smokeless Tobacco Never    Allergies  Allergen Reactions   Trazodone And Nefazodone Other (See Comments)    Bad headaches   Codeine Nausea Only   Propoxyphene N-Acetaminophen     "makes me sick"   Tramadol     Other Reaction(s): other   Objective:  There were no vitals filed for this visit. There is no height or weight on file to calculate BMI. Constitutional Well developed. Well nourished.  Vascular Dorsalis pedis pulses palpable bilaterally. Posterior tibial pulses palpable bilaterally. Capillary refill normal to all digits.  No cyanosis or clubbing noted. Pedal hair growth normal.  Neurologic Normal speech. Oriented to person, place, and time. Epicritic sensation to light touch grossly present bilaterally.  Dermatologic  Nails well groomed and normal in appearance. No open wounds. No skin lesions.  Orthopedic: Pain on palpation to the left posterior tibial tendon along the tendon as well as insertion.  Pain with resisted inversion plantarflexion of the foot.  No pain with dorsiflexion eversion of the foot.  No pain at the Achilles tendon peroneal tendon ATFL ligament.   Radiographs: IMPRESSION: 1. Severe tendinosis of the tibialis posterior tendon with partial-thickness tearing of the proximal inframalleolar tendon. This has significantly progressed compared to the prior MRI. No full-thickness or retracted tear. 2. The superomedial aspect of  the spring ligament complex is not clearly seen and is likely severely degenerated or torn. 3. Pes planovalgus alignment. Assessment:   1. Posterior tibial tendinitis, left   2. Bony exostosis   3. Encounter for preoperative examination for general surgical procedure    Plan:  Patient was evaluated and treated and all questions answered.  Left posterior tibial tendinitis with insertional pain into the navicular/exostosis -All questions and concerns were discussed with the patient in extensive detail -Given the amount of pain that she is having she will benefit from surgical intervention with left posterior tibial tendinitis with primary repair of the tendon with Kidner advancement.  I discussed this with the patient in extensive detail she states understanding and would like to proceed with surgery. -Informed surgical risk consent was reviewed and read aloud to the patient.  I reviewed the films.  I have discussed my findings with the patient in great detail.  I have discussed all risks including but not limited to infection, stiffness, scarring, limp, disability, deformity, damage to blood vessels and nerves, numbness, poor healing, need for braces, arthritis, chronic pain, amputation, death.  All benefits and realistic expectations discussed in great detail.  I have made no promises as to the outcome.  I have provided realistic expectations.  I have offered the patient a 2nd opinion, which they have declined and assured me they preferred to proceed despite the risks   No follow-ups on file.

## 2023-06-22 ENCOUNTER — Telehealth: Payer: Self-pay | Admitting: Podiatry

## 2023-06-22 NOTE — Telephone Encounter (Signed)
DOS-07/17/2023  REPAIR POST TIBAL TENDON LT- [id number] [facility] ADVANCED TENDON [id number]  [facility] EFFECTIVE DATE- 09/26/2022  DEDUCTIBLE- $240.00 WITH REMAINING $0.00 OOP- $8850.00 WITH REMAINING $7736.00 COINSURANCE- 20%  PER THE [employer] WEBSITE PORTAL, PRIOR AUTH IS NOT REQUIRED FOR CPT CODES 45409 AND 28238. GOOD FROM 07/17/2023 - 09/26/2023  AUTH Decision ID #: [id number]

## 2023-06-26 DIAGNOSIS — I10 Essential (primary) hypertension: Secondary | ICD-10-CM | POA: Diagnosis not present

## 2023-06-26 DIAGNOSIS — E785 Hyperlipidemia, unspecified: Secondary | ICD-10-CM | POA: Diagnosis not present

## 2023-06-26 DIAGNOSIS — E8889 Other specified metabolic disorders: Secondary | ICD-10-CM | POA: Diagnosis not present

## 2023-06-26 DIAGNOSIS — E559 Vitamin D deficiency, unspecified: Secondary | ICD-10-CM | POA: Diagnosis not present

## 2023-06-26 DIAGNOSIS — R7303 Prediabetes: Secondary | ICD-10-CM | POA: Diagnosis not present

## 2023-07-07 ENCOUNTER — Ambulatory Visit (INDEPENDENT_AMBULATORY_CARE_PROVIDER_SITE_OTHER): Payer: 59 | Admitting: Podiatry

## 2023-07-07 DIAGNOSIS — M7672 Peroneal tendinitis, left leg: Secondary | ICD-10-CM

## 2023-07-07 NOTE — Progress Notes (Signed)
Subjective:  Patient ID: Darlene [name], female    DOB: May 28, 1951,  MRN: [id number]  Chief Complaint  Patient presents with   Foot Pain    LEFT FOOT PAIN ON LATERAL SIDEFROM HEEL UP TO THE MID FOOT, MEDIAL SIDE PAIN IS GETTING BETTER, HAVING SOME SWELLING, PAIN LEVEL 8-9/10, THE PAIN IS MAINLY WHEN SHE IS WALKING, SHE IS WEARING THE BOOT, KEEPING FOOT ELEVATED AND ICING IT , SOAKING IT SOMETIMES    72 y.o. female presents with the above complaint.  Patient presents with new complaint of left lateral foot pain.  She states it came on over causing her some issues.  She has surgery scheduled with me next week.  Denies any other acute complaints   Review of Systems: Negative except as noted in the HPI. Denies N/V/F/Ch.  Past Medical History:  Diagnosis Date   Anxiety    Cataracts, bilateral    md just watching    Constipation    Depression    Elevated LFTs    history of, normal abdominal ultrasound, LFT's normal 01/2007   Gallbladder problem    History of chest pain    rulre out MI in 12/2006.   Hyperlipidemia    diet controlled   Hypertension    Obesity    Osteoarthritis of both knees 12/26/2007   Xray of knee 10/2007: Bilateral osteoarthritis, sent to PT but did not complete tx course.     Osteopenia 07/05/2015   Prediabetes 11/30/2015    Current Outpatient Medications:    acetaminophen (TYLENOL) 500 MG tablet, Take 1,000 mg by mouth every 8 (eight) hours as needed for moderate pain., Disp: , Rfl:    Biotin w/ Vitamins C & E (HAIR SKIN & NAILS GUMMIES PO), Take 2 capsules by mouth every other day., Disp: , Rfl:    Efinaconazole 10 % SOLN, Apply 1 drop topically daily. (Patient taking differently: Apply 1 drop topically every 30 (thirty) days.), Disp: 4 mL, Rfl: 11   hydrochlorothiazide (HYDRODIURIL) 12.5 MG tablet, Take 12.5 mg by mouth daily., Disp: , Rfl:    HYDROcodone-acetaminophen (NORCO) 5-325 MG tablet, Take 1 tablet by mouth every 6 (six) hours as needed for  moderate pain (pain score 4-6)., Disp: 30 tablet, Rfl: 0   HYDROcodone-acetaminophen (NORCO/VICODIN) 5-325 MG tablet, Take 1-2 tablets by mouth every 6 (six) hours as needed for severe pain., Disp: 42 tablet, Rfl: 0   ibuprofen (ADVIL) 800 MG tablet, Take 1 tablet (800 mg total) by mouth every 6 (six) hours as needed., Disp: 60 tablet, Rfl: 1   Magnesium 400 MG TABS, Take 400 mg by mouth in the morning., Disp: , Rfl:    potassium chloride (KLOR-CON) 10 MEQ tablet, Take 10 mEq by mouth every evening., Disp: , Rfl:    pravastatin (PRAVACHOL) 10 MG tablet, Take by mouth., Disp: , Rfl:    vitamin B-12 (CYANOCOBALAMIN) 1000 MCG tablet, Take 1,000 mcg by mouth every other day. In the morning, Disp: , Rfl:    Vitamin D, Ergocalciferol, (DRISDOL) 1.25 MG (50000 UNIT) CAPS capsule, Take 1 capsule (50,000 Units total) by mouth every 7 (seven) days., Disp: 12 capsule, Rfl: 0  Social History   Tobacco Use  Smoking Status Never  Smokeless Tobacco Never    Allergies  Allergen Reactions   Trazodone And Nefazodone Other (See Comments)    Bad headaches   Codeine Nausea Only   Propoxyphene N-Acetaminophen     "makes me sick"   Tramadol     Other Reaction(s):  other   Objective:  There were no vitals filed for this visit. There is no height or weight on file to calculate BMI. Constitutional Well developed. Well nourished.  Vascular Dorsalis pedis pulses palpable bilaterally. Posterior tibial pulses palpable bilaterally. Capillary refill normal to all digits.  No cyanosis or clubbing noted. Pedal hair growth normal.  Neurologic Normal speech. Oriented to person, place, and time. Epicritic sensation to light touch grossly present bilaterally.  Dermatologic Nails well groomed and normal in appearance. No open wounds. No skin lesions.  Orthopedic: Pain on palpation to the left posterior tibial tendon along the tendon as well as insertion.  Pain with resisted inversion plantarflexion of the foot.   No pain with dorsiflexion eversion of the foot.  No pain at the Achilles tendon peroneal tendon ATFL ligament.   Radiographs: IMPRESSION: 1. Severe tendinosis of the tibialis posterior tendon with partial-thickness tearing of the proximal inframalleolar tendon. This has significantly progressed compared to the prior MRI. No full-thickness or retracted tear. 2. The superomedial aspect of the spring ligament complex is not clearly seen and is likely severely degenerated or torn. 3. Pes planovalgus alignment. Assessment:   1. Peroneal tendinitis of left lower extremity     Plan:  Patient was evaluated and treated and all questions answered.  Left peroneal tendinitis -All questions and concerns were discussed with the patient in extensive detail -Given the amount of pain that she is having she will benefit from steroid injection of decrease inflammatory component associate with pain.  Patient agrees with plan to proceed with steroid injection -A steroid injection was performed at left lateral foot at point of maximal tenderness using 1% plain Lidocaine and 10 mg of Kenalog. This was well tolerated.   Left posterior tibial tendinitis with insertional pain into the navicular/exostosis -All questions and concerns were discussed with the patient in extensive detail -Given the amount of pain that she is having she will benefit from surgical intervention with left posterior tibial tendinitis with primary repair of the tendon with Kidner advancement.  I discussed this with the patient in extensive detail she states understanding and would like to proceed with surgery. -Informed surgical risk consent was reviewed and read aloud to the patient.  I reviewed the films.  I have discussed my findings with the patient in great detail.  I have discussed all risks including but not limited to infection, stiffness, scarring, limp, disability, deformity, damage to blood vessels and nerves, numbness, poor  healing, need for braces, arthritis, chronic pain, amputation, death.  All benefits and realistic expectations discussed in great detail.  I have made no promises as to the outcome.  I have provided realistic expectations.  I have offered the patient a 2nd opinion, which they have declined and assured me they preferred to proceed despite the risks   No follow-ups on file.

## 2023-07-13 DIAGNOSIS — E785 Hyperlipidemia, unspecified: Secondary | ICD-10-CM | POA: Diagnosis not present

## 2023-07-13 DIAGNOSIS — I10 Essential (primary) hypertension: Secondary | ICD-10-CM | POA: Diagnosis not present

## 2023-07-13 DIAGNOSIS — E559 Vitamin D deficiency, unspecified: Secondary | ICD-10-CM | POA: Diagnosis not present

## 2023-07-13 DIAGNOSIS — R7303 Prediabetes: Secondary | ICD-10-CM | POA: Diagnosis not present

## 2023-07-17 ENCOUNTER — Other Ambulatory Visit: Payer: Self-pay | Admitting: Podiatry

## 2023-07-17 DIAGNOSIS — M76822 Posterior tibial tendinitis, left leg: Secondary | ICD-10-CM | POA: Diagnosis not present

## 2023-07-17 DIAGNOSIS — Q6689 Other  specified congenital deformities of feet: Secondary | ICD-10-CM | POA: Diagnosis not present

## 2023-07-17 DIAGNOSIS — G8918 Other acute postprocedural pain: Secondary | ICD-10-CM | POA: Diagnosis not present

## 2023-07-17 MED ORDER — HYDROCODONE-ACETAMINOPHEN 5-325 MG PO TABS: 1.0000 | ORAL_TABLET | Freq: Four times a day (QID) | ORAL | 0 refills | Status: AC | PRN

## 2023-07-17 MED ORDER — IBUPROFEN 800 MG PO TABS: 800.0000 mg | ORAL_TABLET | Freq: Four times a day (QID) | ORAL | 1 refills | Status: AC | PRN

## 2023-07-18 ENCOUNTER — Telehealth: Payer: Self-pay

## 2023-07-18 DIAGNOSIS — M76822 Posterior tibial tendinitis, left leg: Secondary | ICD-10-CM

## 2023-07-18 DIAGNOSIS — R2689 Other abnormalities of gait and mobility: Secondary | ICD-10-CM

## 2023-07-18 NOTE — Addendum Note (Signed)
Addended by: Nicholes Rough on: 07/18/2023 01:10 PM   Modules accepted: Orders

## 2023-07-18 NOTE — Telephone Encounter (Signed)
Patient called 10/21 at 4:43 pm and left a message - she is requesting a home health referral  She needs help after surgery. Please advise -thanks

## 2023-07-20 DIAGNOSIS — I10 Essential (primary) hypertension: Secondary | ICD-10-CM | POA: Diagnosis not present

## 2023-07-20 DIAGNOSIS — E785 Hyperlipidemia, unspecified: Secondary | ICD-10-CM | POA: Diagnosis not present

## 2023-07-20 DIAGNOSIS — M170 Bilateral primary osteoarthritis of knee: Secondary | ICD-10-CM | POA: Diagnosis not present

## 2023-07-20 DIAGNOSIS — Z5982 Transportation insecurity: Secondary | ICD-10-CM | POA: Diagnosis not present

## 2023-07-20 DIAGNOSIS — M898X9 Other specified disorders of bone, unspecified site: Secondary | ICD-10-CM | POA: Diagnosis not present

## 2023-07-20 DIAGNOSIS — Z4789 Encounter for other orthopedic aftercare: Secondary | ICD-10-CM | POA: Diagnosis not present

## 2023-07-20 DIAGNOSIS — K219 Gastro-esophageal reflux disease without esophagitis: Secondary | ICD-10-CM | POA: Diagnosis not present

## 2023-07-25 DIAGNOSIS — Z5982 Transportation insecurity: Secondary | ICD-10-CM | POA: Diagnosis not present

## 2023-07-25 DIAGNOSIS — E785 Hyperlipidemia, unspecified: Secondary | ICD-10-CM | POA: Diagnosis not present

## 2023-07-25 DIAGNOSIS — M898X9 Other specified disorders of bone, unspecified site: Secondary | ICD-10-CM | POA: Diagnosis not present

## 2023-07-25 DIAGNOSIS — K219 Gastro-esophageal reflux disease without esophagitis: Secondary | ICD-10-CM | POA: Diagnosis not present

## 2023-07-25 DIAGNOSIS — I10 Essential (primary) hypertension: Secondary | ICD-10-CM | POA: Diagnosis not present

## 2023-07-25 DIAGNOSIS — M170 Bilateral primary osteoarthritis of knee: Secondary | ICD-10-CM | POA: Diagnosis not present

## 2023-07-25 DIAGNOSIS — Z4789 Encounter for other orthopedic aftercare: Secondary | ICD-10-CM | POA: Diagnosis not present

## 2023-07-26 ENCOUNTER — Ambulatory Visit (INDEPENDENT_AMBULATORY_CARE_PROVIDER_SITE_OTHER): Payer: 59

## 2023-07-26 ENCOUNTER — Encounter: Payer: 59 | Admitting: Podiatry

## 2023-07-26 ENCOUNTER — Ambulatory Visit (INDEPENDENT_AMBULATORY_CARE_PROVIDER_SITE_OTHER): Payer: 59 | Admitting: Podiatry

## 2023-07-26 DIAGNOSIS — K219 Gastro-esophageal reflux disease without esophagitis: Secondary | ICD-10-CM | POA: Diagnosis not present

## 2023-07-26 DIAGNOSIS — M170 Bilateral primary osteoarthritis of knee: Secondary | ICD-10-CM | POA: Diagnosis not present

## 2023-07-26 DIAGNOSIS — M778 Other enthesopathies, not elsewhere classified: Secondary | ICD-10-CM

## 2023-07-26 DIAGNOSIS — M76822 Posterior tibial tendinitis, left leg: Secondary | ICD-10-CM

## 2023-07-26 DIAGNOSIS — E785 Hyperlipidemia, unspecified: Secondary | ICD-10-CM | POA: Diagnosis not present

## 2023-07-26 DIAGNOSIS — Z5982 Transportation insecurity: Secondary | ICD-10-CM | POA: Diagnosis not present

## 2023-07-26 DIAGNOSIS — Z9889 Other specified postprocedural states: Secondary | ICD-10-CM

## 2023-07-26 DIAGNOSIS — I10 Essential (primary) hypertension: Secondary | ICD-10-CM | POA: Diagnosis not present

## 2023-07-26 DIAGNOSIS — Z4789 Encounter for other orthopedic aftercare: Secondary | ICD-10-CM | POA: Diagnosis not present

## 2023-07-26 DIAGNOSIS — M898X9 Other specified disorders of bone, unspecified site: Secondary | ICD-10-CM | POA: Diagnosis not present

## 2023-07-26 NOTE — Progress Notes (Signed)
Subjective:  Patient ID: Darlene [name], female    DOB: 1951/09/05,  MRN: [id number]  No chief complaint on file.   DOS: 07/17/2023 Procedure: Left posterior tibial tendinitis with Kidner advancement  72 y.o. female returns for post-op check.  Patient states she is doing well.  She has been trying to be nonweightbearing.  She has not been very compliant with it.  Bandages clean dry and intact.  Review of Systems: Negative except as noted in the HPI. Denies N/V/F/Ch.  Past Medical History:  Diagnosis Date   Anxiety    Cataracts, bilateral    md just watching    Constipation    Depression    Elevated LFTs    history of, normal abdominal ultrasound, LFT's normal 01/2007   Gallbladder problem    History of chest pain    rulre out MI in 12/2006.   Hyperlipidemia    diet controlled   Hypertension    Obesity    Osteoarthritis of both knees 12/26/2007   Xray of knee 10/2007: Bilateral osteoarthritis, sent to PT but did not complete tx course.     Osteopenia 07/05/2015   Prediabetes 11/30/2015    Current Outpatient Medications:    acetaminophen (TYLENOL) 500 MG tablet, Take 1,000 mg by mouth every 8 (eight) hours as needed for moderate pain., Disp: , Rfl:    Biotin w/ Vitamins C & E (HAIR SKIN & NAILS GUMMIES PO), Take 2 capsules by mouth every other day., Disp: , Rfl:    Efinaconazole 10 % SOLN, Apply 1 drop topically daily. (Patient taking differently: Apply 1 drop topically every 30 (thirty) days.), Disp: 4 mL, Rfl: 11   hydrochlorothiazide (HYDRODIURIL) 12.5 MG tablet, Take 12.5 mg by mouth daily., Disp: , Rfl:    HYDROcodone-acetaminophen (NORCO) 5-325 MG tablet, Take 1 tablet by mouth every 6 (six) hours as needed for moderate pain (pain score 4-6)., Disp: 30 tablet, Rfl: 0   HYDROcodone-acetaminophen (NORCO/VICODIN) 5-325 MG tablet, Take 1-2 tablets by mouth every 6 (six) hours as needed for severe pain., Disp: 42 tablet, Rfl: 0   ibuprofen (ADVIL) 800 MG tablet, Take 1  tablet (800 mg total) by mouth every 6 (six) hours as needed., Disp: 60 tablet, Rfl: 1   Magnesium 400 MG TABS, Take 400 mg by mouth in the morning., Disp: , Rfl:    potassium chloride (KLOR-CON) 10 MEQ tablet, Take 10 mEq by mouth every evening., Disp: , Rfl:    pravastatin (PRAVACHOL) 10 MG tablet, Take by mouth., Disp: , Rfl:    vitamin B-12 (CYANOCOBALAMIN) 1000 MCG tablet, Take 1,000 mcg by mouth every other day. In the morning, Disp: , Rfl:    Vitamin D, Ergocalciferol, (DRISDOL) 1.25 MG (50000 UNIT) CAPS capsule, Take 1 capsule (50,000 Units total) by mouth every 7 (seven) days., Disp: 12 capsule, Rfl: 0  Social History   Tobacco Use  Smoking Status Never  Smokeless Tobacco Never    Allergies  Allergen Reactions   Trazodone And Nefazodone Other (See Comments)    Bad headaches   Codeine Nausea Only   Propoxyphene N-Acetaminophen     "makes me sick"   Tramadol     Other Reaction(s): other   Objective:  There were no vitals filed for this visit. There is no height or weight on file to calculate BMI. Constitutional Well developed. Well nourished.  Vascular Foot warm and well perfused. Capillary refill normal to all digits.   Neurologic Normal speech. Oriented to person, place, and time. Epicritic  sensation to light touch grossly present bilaterally.  Dermatologic Skin healing well without signs of infection. Skin edges well coapted without signs of infection.  Orthopedic: Tenderness to palpation noted about the surgical site.   Radiographs: 3 views of skeletally mature adult left foot: Hardware is intact.  No signs of backing out or loosening noted. Assessment:   1. Status post foot surgery   2. Posterior tibial tendinitis, left    Plan:  Patient was evaluated and treated and all questions answered.  S/p foot surgery right -Progressing as expected post-operatively. -XR: See above -WB Status: Nonweightbearing to the right lower extremity in walker -Sutures:  Intact..  No clinical signs of Deis is noted no complication noted. -Medications: None -Foot redressed.  No follow-ups on file.

## 2023-07-27 ENCOUNTER — Telehealth: Payer: Self-pay

## 2023-07-27 DIAGNOSIS — Z5982 Transportation insecurity: Secondary | ICD-10-CM | POA: Diagnosis not present

## 2023-07-27 DIAGNOSIS — M898X9 Other specified disorders of bone, unspecified site: Secondary | ICD-10-CM | POA: Diagnosis not present

## 2023-07-27 DIAGNOSIS — M170 Bilateral primary osteoarthritis of knee: Secondary | ICD-10-CM | POA: Diagnosis not present

## 2023-07-27 DIAGNOSIS — E785 Hyperlipidemia, unspecified: Secondary | ICD-10-CM | POA: Diagnosis not present

## 2023-07-27 DIAGNOSIS — K219 Gastro-esophageal reflux disease without esophagitis: Secondary | ICD-10-CM | POA: Diagnosis not present

## 2023-07-27 DIAGNOSIS — I10 Essential (primary) hypertension: Secondary | ICD-10-CM | POA: Diagnosis not present

## 2023-07-27 DIAGNOSIS — Z4789 Encounter for other orthopedic aftercare: Secondary | ICD-10-CM | POA: Diagnosis not present

## 2023-07-27 NOTE — Telephone Encounter (Signed)
Received a phone message from Maralyn Sago with [facility] [phone].She is concerned that patient's post op is 08/09/23, thinks it might be too far out. She is also asking about dressing changes - is she supposed to keep current dressing on until then - please advise, thanks

## 2023-07-31 DIAGNOSIS — Z5982 Transportation insecurity: Secondary | ICD-10-CM | POA: Diagnosis not present

## 2023-07-31 DIAGNOSIS — I10 Essential (primary) hypertension: Secondary | ICD-10-CM | POA: Diagnosis not present

## 2023-07-31 DIAGNOSIS — M170 Bilateral primary osteoarthritis of knee: Secondary | ICD-10-CM | POA: Diagnosis not present

## 2023-07-31 DIAGNOSIS — E785 Hyperlipidemia, unspecified: Secondary | ICD-10-CM | POA: Diagnosis not present

## 2023-07-31 DIAGNOSIS — K219 Gastro-esophageal reflux disease without esophagitis: Secondary | ICD-10-CM | POA: Diagnosis not present

## 2023-07-31 DIAGNOSIS — M898X9 Other specified disorders of bone, unspecified site: Secondary | ICD-10-CM | POA: Diagnosis not present

## 2023-07-31 DIAGNOSIS — Z4789 Encounter for other orthopedic aftercare: Secondary | ICD-10-CM | POA: Diagnosis not present

## 2023-08-02 DIAGNOSIS — M898X9 Other specified disorders of bone, unspecified site: Secondary | ICD-10-CM | POA: Diagnosis not present

## 2023-08-02 DIAGNOSIS — K219 Gastro-esophageal reflux disease without esophagitis: Secondary | ICD-10-CM | POA: Diagnosis not present

## 2023-08-02 DIAGNOSIS — M170 Bilateral primary osteoarthritis of knee: Secondary | ICD-10-CM | POA: Diagnosis not present

## 2023-08-02 DIAGNOSIS — Z5982 Transportation insecurity: Secondary | ICD-10-CM | POA: Diagnosis not present

## 2023-08-02 DIAGNOSIS — I10 Essential (primary) hypertension: Secondary | ICD-10-CM | POA: Diagnosis not present

## 2023-08-02 DIAGNOSIS — Z4789 Encounter for other orthopedic aftercare: Secondary | ICD-10-CM | POA: Diagnosis not present

## 2023-08-02 DIAGNOSIS — E785 Hyperlipidemia, unspecified: Secondary | ICD-10-CM | POA: Diagnosis not present

## 2023-08-09 ENCOUNTER — Encounter: Payer: Self-pay | Admitting: Podiatry

## 2023-08-09 ENCOUNTER — Encounter: Payer: 59 | Admitting: Podiatry

## 2023-08-09 ENCOUNTER — Ambulatory Visit (INDEPENDENT_AMBULATORY_CARE_PROVIDER_SITE_OTHER): Payer: 59 | Admitting: Podiatry

## 2023-08-09 VITALS — Ht 59.0 in | Wt 197.0 lb

## 2023-08-09 DIAGNOSIS — M76822 Posterior tibial tendinitis, left leg: Secondary | ICD-10-CM

## 2023-08-09 DIAGNOSIS — Z9889 Other specified postprocedural states: Secondary | ICD-10-CM

## 2023-08-09 MED ORDER — IBUPROFEN 800 MG PO TABS: 800.0000 mg | ORAL_TABLET | Freq: Four times a day (QID) | ORAL | 1 refills | Status: AC | PRN

## 2023-08-09 NOTE — Progress Notes (Signed)
Subjective:  Patient ID: Darlene [name], female    DOB: 1950/10/09,  MRN: [id number]  Chief Complaint  Patient presents with   Routine Post Op    DOS: 07/17/2023 Procedure: Left posterior tibial tendinitis with Kidner advancement  72 y.o. female returns for post-op check.  Patient states she is doing well.  She has been trying to be nonweightbearing.  She has not been very compliant with it.  Bandages clean dry and intact.  Review of Systems: Negative except as noted in the HPI. Denies N/V/F/Ch.  Past Medical History:  Diagnosis Date   Anxiety    Cataracts, bilateral    md just watching    Constipation    Depression    Elevated LFTs    history of, normal abdominal ultrasound, LFT's normal 01/2007   Gallbladder problem    History of chest pain    rulre out MI in 12/2006.   Hyperlipidemia    diet controlled   Hypertension    Obesity    Osteoarthritis of both knees 12/26/2007   Xray of knee 10/2007: Bilateral osteoarthritis, sent to PT but did not complete tx course.     Osteopenia 07/05/2015   Prediabetes 11/30/2015    Current Outpatient Medications:    acetaminophen (TYLENOL) 500 MG tablet, Take 1,000 mg by mouth every 8 (eight) hours as needed for moderate pain., Disp: , Rfl:    Biotin w/ Vitamins C & E (HAIR SKIN & NAILS GUMMIES PO), Take 2 capsules by mouth every other day., Disp: , Rfl:    Efinaconazole 10 % SOLN, Apply 1 drop topically daily. (Patient taking differently: Apply 1 drop topically every 30 (thirty) days.), Disp: 4 mL, Rfl: 11   hydrochlorothiazide (HYDRODIURIL) 12.5 MG tablet, Take 12.5 mg by mouth daily., Disp: , Rfl:    HYDROcodone-acetaminophen (NORCO) 5-325 MG tablet, Take 1 tablet by mouth every 6 (six) hours as needed for moderate pain (pain score 4-6)., Disp: 30 tablet, Rfl: 0   HYDROcodone-acetaminophen (NORCO/VICODIN) 5-325 MG tablet, Take 1-2 tablets by mouth every 6 (six) hours as needed for severe pain., Disp: 42 tablet, Rfl: 0   ibuprofen  (ADVIL) 800 MG tablet, Take 1 tablet (800 mg total) by mouth every 6 (six) hours as needed., Disp: 60 tablet, Rfl: 1   ibuprofen (ADVIL) 800 MG tablet, Take 1 tablet (800 mg total) by mouth every 6 (six) hours as needed., Disp: 60 tablet, Rfl: 1   Magnesium 400 MG TABS, Take 400 mg by mouth in the morning., Disp: , Rfl:    potassium chloride (KLOR-CON) 10 MEQ tablet, Take 10 mEq by mouth every evening., Disp: , Rfl:    pravastatin (PRAVACHOL) 10 MG tablet, Take by mouth., Disp: , Rfl:    vitamin B-12 (CYANOCOBALAMIN) 1000 MCG tablet, Take 1,000 mcg by mouth every other day. In the morning, Disp: , Rfl:    Vitamin D, Ergocalciferol, (DRISDOL) 1.25 MG (50000 UNIT) CAPS capsule, Take 1 capsule (50,000 Units total) by mouth every 7 (seven) days., Disp: 12 capsule, Rfl: 0  Social History   Tobacco Use  Smoking Status Never  Smokeless Tobacco Never    Allergies  Allergen Reactions   Trazodone And Nefazodone Other (See Comments)    Bad headaches   Codeine Nausea Only   Propoxyphene N-Acetaminophen     "makes me sick"   Tramadol     Other Reaction(s): other   Objective:  There were no vitals filed for this visit. Body mass index is 39.79 kg/m. Constitutional Well  developed. Well nourished.  Vascular Foot warm and well perfused. Capillary refill normal to all digits.   Neurologic Normal speech. Oriented to person, place, and time. Epicritic sensation to light touch grossly present bilaterally.  Dermatologic Skin healing well without signs of infection. Skin edges well coapted without signs of infection.  Orthopedic: Tenderness to palpation noted about the surgical site.   Radiographs: 3 views of skeletally mature adult left foot: Hardware is intact.  No signs of backing out or loosening noted. Assessment:   No diagnosis found.  Plan:  Patient was evaluated and treated and all questions answered.  S/p foot surgery right -Progressing as expected post-operatively. -XR: See  above -WB Status: Slowly begin weightbearing as tolerated cam boot in 2 weeks. -Sutures: Removed.  No signs of dehiscence noted no complication noted. -Medications: None -Patient not compliant with the weightbearing restrictions.  She has been ambulating on the foot without a walker.  I discussed the importance of walker.  She states understanding. -She will benefit from physical therapy physical therapy prescription was given for nonweightbearing exercises and strengthening  No follow-ups on file.

## 2023-08-10 ENCOUNTER — Telehealth: Payer: Self-pay

## 2023-08-10 DIAGNOSIS — E559 Vitamin D deficiency, unspecified: Secondary | ICD-10-CM | POA: Diagnosis not present

## 2023-08-10 DIAGNOSIS — R7303 Prediabetes: Secondary | ICD-10-CM | POA: Diagnosis not present

## 2023-08-10 NOTE — Telephone Encounter (Signed)
Clydie Braun, home health RN from [facility], called and left a message this morning. She spoke to the patient this morning to schedule a time to come to her house. Patient refused. Clydie Braun offered  times today and tomorrow. The patient told Clydie Braun that she is just too busy to see her either of those days. Clydie Braun just wanted you to know. Thanks

## 2023-08-29 DIAGNOSIS — E785 Hyperlipidemia, unspecified: Secondary | ICD-10-CM | POA: Diagnosis not present

## 2023-08-29 DIAGNOSIS — R7303 Prediabetes: Secondary | ICD-10-CM | POA: Diagnosis not present

## 2023-08-30 ENCOUNTER — Encounter: Payer: Self-pay | Admitting: Podiatry

## 2023-08-30 ENCOUNTER — Ambulatory Visit (INDEPENDENT_AMBULATORY_CARE_PROVIDER_SITE_OTHER): Payer: 59 | Admitting: Podiatry

## 2023-08-30 ENCOUNTER — Ambulatory Visit (INDEPENDENT_AMBULATORY_CARE_PROVIDER_SITE_OTHER): Payer: 59

## 2023-08-30 DIAGNOSIS — M76822 Posterior tibial tendinitis, left leg: Secondary | ICD-10-CM

## 2023-08-30 DIAGNOSIS — E785 Hyperlipidemia, unspecified: Secondary | ICD-10-CM | POA: Diagnosis not present

## 2023-08-30 DIAGNOSIS — R7303 Prediabetes: Secondary | ICD-10-CM | POA: Diagnosis not present

## 2023-08-30 DIAGNOSIS — Z23 Encounter for immunization: Secondary | ICD-10-CM | POA: Diagnosis not present

## 2023-08-30 DIAGNOSIS — I10 Essential (primary) hypertension: Secondary | ICD-10-CM | POA: Diagnosis not present

## 2023-08-30 DIAGNOSIS — Z9889 Other specified postprocedural states: Secondary | ICD-10-CM

## 2023-08-30 NOTE — Progress Notes (Signed)
Subjective:  Patient ID: Darlene [name], female    DOB: [birth year],  MRN: [id number]  Chief Complaint  Patient presents with   Foot Pain    PATIENT STATES SHE IS HAVING PROBLEMS WITH HER FOOT HURTING UNDERNEATH , PATIENT STATES WHEN SHE WALKS IT HURTS, PATIENT IS INTERESTED IN SOMETHING SHE CAN PUT IN THE SHOES TO MAKE IT SOFT FOR WHEN SHE WALKS.     DOS: 07/17/2023 Procedure: Left posterior tibial tendinitis with Kidner advancement  72 y.o. female returns for post-op check.  Patient states she is doing well.  She has been trying to be nonweightbearing.  She has not been very compliant with it.  Bandages clean dry and intact.  Review of Systems: Negative except as noted in the HPI. Denies N/V/F/Ch.  Past Medical History:  Diagnosis Date   Anxiety    Cataracts, bilateral    md just watching    Constipation    Depression    Elevated LFTs    history of, normal abdominal ultrasound, LFT's normal 01/2007   Gallbladder problem    History of chest pain    rulre out MI in 12/2006.   Hyperlipidemia    diet controlled   Hypertension    Obesity    Osteoarthritis of both knees 12/26/2007   Xray of knee 10/2007: Bilateral osteoarthritis, sent to PT but did not complete tx course.     Osteopenia 07/05/2015   Prediabetes 11/30/2015    Current Outpatient Medications:    acetaminophen (TYLENOL) 500 MG tablet, Take 1,000 mg by mouth every 8 (eight) hours as needed for moderate pain., Disp: , Rfl:    Biotin w/ Vitamins C & E (HAIR SKIN & NAILS GUMMIES PO), Take 2 capsules by mouth every other day., Disp: , Rfl:    Efinaconazole 10 % SOLN, Apply 1 drop topically daily. (Patient taking differently: Apply 1 drop topically every 30 (thirty) days.), Disp: 4 mL, Rfl: 11   hydrochlorothiazide (HYDRODIURIL) 12.5 MG tablet, Take 12.5 mg by mouth daily., Disp: , Rfl:    HYDROcodone-acetaminophen (NORCO) 5-325 MG tablet, Take 1 tablet by mouth every 6 (six) hours as needed for moderate pain (pain  score 4-6)., Disp: 30 tablet, Rfl: 0   HYDROcodone-acetaminophen (NORCO/VICODIN) 5-325 MG tablet, Take 1-2 tablets by mouth every 6 (six) hours as needed for severe pain., Disp: 42 tablet, Rfl: 0   ibuprofen (ADVIL) 800 MG tablet, Take 1 tablet (800 mg total) by mouth every 6 (six) hours as needed., Disp: 60 tablet, Rfl: 1   ibuprofen (ADVIL) 800 MG tablet, Take 1 tablet (800 mg total) by mouth every 6 (six) hours as needed., Disp: 60 tablet, Rfl: 1   Magnesium 400 MG TABS, Take 400 mg by mouth in the morning., Disp: , Rfl:    potassium chloride (KLOR-CON) 10 MEQ tablet, Take 10 mEq by mouth every evening., Disp: , Rfl:    pravastatin (PRAVACHOL) 10 MG tablet, Take by mouth., Disp: , Rfl:    vitamin B-12 (CYANOCOBALAMIN) 1000 MCG tablet, Take 1,000 mcg by mouth every other day. In the morning, Disp: , Rfl:    Vitamin D, Ergocalciferol, (DRISDOL) 1.25 MG (50000 UNIT) CAPS capsule, Take 1 capsule (50,000 Units total) by mouth every 7 (seven) days., Disp: 12 capsule, Rfl: 0  Social History   Tobacco Use  Smoking Status Never  Smokeless Tobacco Never    Allergies  Allergen Reactions   Trazodone And Nefazodone Other (See Comments)    Bad headaches   Codeine Nausea Only  Propoxyphene N-Acetaminophen     "makes me sick"   Tramadol     Other Reaction(s): other   Objective:  There were no vitals filed for this visit. There is no height or weight on file to calculate BMI. Constitutional Well developed. Well nourished.  Vascular Foot warm and well perfused. Capillary refill normal to all digits.   Neurologic Normal speech. Oriented to person, place, and time. Epicritic sensation to light touch grossly present bilaterally.  Dermatologic Skin completely well reepithelialized.  Good correction alignment noted.  Adequate strength of the posterior tibial tendon.  Orthopedic: Tenderness to palpation noted about the surgical site.   Radiographs: 3 views of skeletally mature adult left foot:  50% reduction of hardware noted from the navicular.  The other 50% is well adhered.  Will continue to clinically monitor. Assessment:   1. Posterior tibial tendinitis, left     Plan:  Patient was evaluated and treated and all questions answered.  S/p foot surgery right -Progressing as expected post-operatively. -XR: See above -WB Status: For now nonweightbearing to the right lower extremity. -Sutures: Removed.  No signs of dehiscence noted no complication noted. -Medications: None -Patient not compliant with the weightbearing restrictions.  She has been ambulating on the foot without a walker.  I discussed the importance of walker.  She states understanding. -X-ray shows partial loss of bone anchor from the navicular bone the rest is still well adhered for now.  I encouraged her to not put a lot of weight on it.  She has been noncompliant.  She states understanding -She will benefit from physical therapy physical therapy prescription was given for nonweightbearing exercises and strengthening  No follow-ups on file.

## 2023-09-06 ENCOUNTER — Encounter: Payer: 59 | Admitting: Podiatry

## 2023-09-12 DIAGNOSIS — M25572 Pain in left ankle and joints of left foot: Secondary | ICD-10-CM | POA: Diagnosis not present

## 2023-09-15 DIAGNOSIS — M25572 Pain in left ankle and joints of left foot: Secondary | ICD-10-CM | POA: Diagnosis not present

## 2023-09-18 DIAGNOSIS — M25572 Pain in left ankle and joints of left foot: Secondary | ICD-10-CM | POA: Diagnosis not present

## 2023-10-03 DIAGNOSIS — M25572 Pain in left ankle and joints of left foot: Secondary | ICD-10-CM | POA: Diagnosis not present

## 2023-10-11 ENCOUNTER — Encounter: Payer: Self-pay | Admitting: Podiatry

## 2023-10-11 ENCOUNTER — Ambulatory Visit (INDEPENDENT_AMBULATORY_CARE_PROVIDER_SITE_OTHER): Payer: 59 | Admitting: Podiatry

## 2023-10-11 DIAGNOSIS — Z9889 Other specified postprocedural states: Secondary | ICD-10-CM

## 2023-10-11 DIAGNOSIS — M76822 Posterior tibial tendinitis, left leg: Secondary | ICD-10-CM

## 2023-10-11 NOTE — Progress Notes (Signed)
Subjective:  Patient ID: Darlene [name], female    DOB: May 09, 1951,  MRN: [id number]  Chief Complaint  Patient presents with   Foot Pain    RM#11 follow up on left foot pain patient states is doing good and has no concerns at the present time.    DOS: 07/17/2023 Procedure: Left posterior tibial tendinitis with Kidner advancement  73 y.o. female returns for post-op check.  Patient states she is doing well.  She has been trying to be nonweightbearing.  She has not been very compliant with it.  Bandages clean dry and intact.  Review of Systems: Negative except as noted in the HPI. Denies N/V/F/Ch.  Past Medical History:  Diagnosis Date   Anxiety    Cataracts, bilateral    md just watching    Constipation    Depression    Elevated LFTs    history of, normal abdominal ultrasound, LFT's normal 01/2007   Gallbladder problem    History of chest pain    rulre out MI in 12/2006.   Hyperlipidemia    diet controlled   Hypertension    Obesity    Osteoarthritis of both knees 12/26/2007   Xray of knee 10/2007: Bilateral osteoarthritis, sent to PT but did not complete tx course.     Osteopenia 07/05/2015   Prediabetes 11/30/2015    Current Outpatient Medications:    acetaminophen (TYLENOL) 500 MG tablet, Take 1,000 mg by mouth every 8 (eight) hours as needed for moderate pain., Disp: , Rfl:    Biotin w/ Vitamins C & E (HAIR SKIN & NAILS GUMMIES PO), Take 2 capsules by mouth every other day., Disp: , Rfl:    Efinaconazole 10 % SOLN, Apply 1 drop topically daily. (Patient taking differently: Apply 1 drop topically every 30 (thirty) days.), Disp: 4 mL, Rfl: 11   hydrochlorothiazide (HYDRODIURIL) 12.5 MG tablet, Take 12.5 mg by mouth daily., Disp: , Rfl:    HYDROcodone-acetaminophen (NORCO) 5-325 MG tablet, Take 1 tablet by mouth every 6 (six) hours as needed for moderate pain (pain score 4-6)., Disp: 30 tablet, Rfl: 0   HYDROcodone-acetaminophen (NORCO/VICODIN) 5-325 MG tablet, Take 1-2  tablets by mouth every 6 (six) hours as needed for severe pain., Disp: 42 tablet, Rfl: 0   ibuprofen (ADVIL) 800 MG tablet, Take 1 tablet (800 mg total) by mouth every 6 (six) hours as needed., Disp: 60 tablet, Rfl: 1   ibuprofen (ADVIL) 800 MG tablet, Take 1 tablet (800 mg total) by mouth every 6 (six) hours as needed., Disp: 60 tablet, Rfl: 1   Magnesium 400 MG TABS, Take 400 mg by mouth in the morning., Disp: , Rfl:    potassium chloride (KLOR-CON) 10 MEQ tablet, Take 10 mEq by mouth every evening., Disp: , Rfl:    pravastatin (PRAVACHOL) 10 MG tablet, Take by mouth., Disp: , Rfl:    vitamin B-12 (CYANOCOBALAMIN) 1000 MCG tablet, Take 1,000 mcg by mouth every other day. In the morning, Disp: , Rfl:    Vitamin D, Ergocalciferol, (DRISDOL) 1.25 MG (50000 UNIT) CAPS capsule, Take 1 capsule (50,000 Units total) by mouth every 7 (seven) days., Disp: 12 capsule, Rfl: 0  Social History   Tobacco Use  Smoking Status Never  Smokeless Tobacco Never    Allergies  Allergen Reactions   Trazodone And Nefazodone Other (See Comments)    Bad headaches   Codeine Nausea Only   Propoxyphene N-Acetaminophen     "makes me sick"   Tramadol     Other  Reaction(s): other   Objective:  There were no vitals filed for this visit. There is no height or weight on file to calculate BMI. Constitutional Well developed. Well nourished.  Vascular Foot warm and well perfused. Capillary refill normal to all digits.   Neurologic Normal speech. Oriented to person, place, and time. Epicritic sensation to light touch grossly present bilaterally.  Dermatologic Skin completely well reepithelialized.  Good correction alignment noted.  Adequate strength of the posterior tibial tendon.  Orthopedic: Tenderness to palpation noted about the surgical site.   Radiographs: 3 views of skeletally mature adult left foot: 50% reduction of hardware noted from the navicular.  The other 50% is well adhered.  Will continue to  clinically monitor. Assessment:   No diagnosis found.   Plan:  Patient was evaluated and treated and all questions answered.  S/p foot surgery right -Progressing as expected post-operatively. -XR: See above -WB Status: Begin weightbearing as tolerated to right lower extremity -Sutures: Removed.  No signs of dehiscence noted no complication noted. -Medications: None -X-ray shows partial loss of bone anchor from the navicular bone the rest is still well adhered for now.  I encouraged her to not put a lot of weight on it.  She has been noncompliant.  She states understanding -Continue physical therapy  No follow-ups on file.

## 2023-10-12 DIAGNOSIS — I10 Essential (primary) hypertension: Secondary | ICD-10-CM | POA: Diagnosis not present

## 2023-10-12 DIAGNOSIS — E785 Hyperlipidemia, unspecified: Secondary | ICD-10-CM | POA: Diagnosis not present

## 2023-10-12 DIAGNOSIS — R7303 Prediabetes: Secondary | ICD-10-CM | POA: Diagnosis not present

## 2023-10-17 DIAGNOSIS — M25572 Pain in left ankle and joints of left foot: Secondary | ICD-10-CM | POA: Diagnosis not present

## 2023-10-17 DIAGNOSIS — Z1211 Encounter for screening for malignant neoplasm of colon: Secondary | ICD-10-CM | POA: Diagnosis not present

## 2023-10-17 DIAGNOSIS — Z1212 Encounter for screening for malignant neoplasm of rectum: Secondary | ICD-10-CM | POA: Diagnosis not present

## 2023-10-24 DIAGNOSIS — M25572 Pain in left ankle and joints of left foot: Secondary | ICD-10-CM | POA: Diagnosis not present

## 2023-10-26 DIAGNOSIS — M25572 Pain in left ankle and joints of left foot: Secondary | ICD-10-CM | POA: Diagnosis not present

## 2023-10-31 DIAGNOSIS — M25572 Pain in left ankle and joints of left foot: Secondary | ICD-10-CM | POA: Diagnosis not present

## 2023-11-08 DIAGNOSIS — E785 Hyperlipidemia, unspecified: Secondary | ICD-10-CM | POA: Diagnosis not present

## 2023-11-08 DIAGNOSIS — I10 Essential (primary) hypertension: Secondary | ICD-10-CM | POA: Diagnosis not present

## 2023-11-08 DIAGNOSIS — R7303 Prediabetes: Secondary | ICD-10-CM | POA: Diagnosis not present

## 2023-11-09 DIAGNOSIS — M25572 Pain in left ankle and joints of left foot: Secondary | ICD-10-CM | POA: Diagnosis not present

## 2023-11-22 DIAGNOSIS — R7303 Prediabetes: Secondary | ICD-10-CM | POA: Diagnosis not present

## 2023-11-22 DIAGNOSIS — E559 Vitamin D deficiency, unspecified: Secondary | ICD-10-CM | POA: Diagnosis not present

## 2023-11-22 DIAGNOSIS — E785 Hyperlipidemia, unspecified: Secondary | ICD-10-CM | POA: Diagnosis not present

## 2023-11-22 DIAGNOSIS — I10 Essential (primary) hypertension: Secondary | ICD-10-CM | POA: Diagnosis not present

## 2023-11-23 DIAGNOSIS — M25572 Pain in left ankle and joints of left foot: Secondary | ICD-10-CM | POA: Diagnosis not present

## 2023-12-05 DIAGNOSIS — M25572 Pain in left ankle and joints of left foot: Secondary | ICD-10-CM | POA: Diagnosis not present

## 2023-12-06 DIAGNOSIS — H35363 Drusen (degenerative) of macula, bilateral: Secondary | ICD-10-CM | POA: Diagnosis not present

## 2023-12-06 DIAGNOSIS — Z961 Presence of intraocular lens: Secondary | ICD-10-CM | POA: Diagnosis not present

## 2023-12-06 DIAGNOSIS — H524 Presbyopia: Secondary | ICD-10-CM | POA: Diagnosis not present

## 2023-12-06 DIAGNOSIS — H11441 Conjunctival cysts, right eye: Secondary | ICD-10-CM | POA: Diagnosis not present

## 2023-12-06 DIAGNOSIS — H04123 Dry eye syndrome of bilateral lacrimal glands: Secondary | ICD-10-CM | POA: Diagnosis not present

## 2023-12-07 DIAGNOSIS — M25572 Pain in left ankle and joints of left foot: Secondary | ICD-10-CM | POA: Diagnosis not present

## 2023-12-19 DIAGNOSIS — E559 Vitamin D deficiency, unspecified: Secondary | ICD-10-CM | POA: Diagnosis not present

## 2023-12-19 DIAGNOSIS — R7303 Prediabetes: Secondary | ICD-10-CM | POA: Diagnosis not present

## 2023-12-19 DIAGNOSIS — I10 Essential (primary) hypertension: Secondary | ICD-10-CM | POA: Diagnosis not present

## 2023-12-19 DIAGNOSIS — E785 Hyperlipidemia, unspecified: Secondary | ICD-10-CM | POA: Diagnosis not present

## 2023-12-21 DIAGNOSIS — M25572 Pain in left ankle and joints of left foot: Secondary | ICD-10-CM | POA: Diagnosis not present

## 2023-12-28 DIAGNOSIS — M25572 Pain in left ankle and joints of left foot: Secondary | ICD-10-CM | POA: Diagnosis not present

## 2024-01-04 DIAGNOSIS — M25572 Pain in left ankle and joints of left foot: Secondary | ICD-10-CM | POA: Diagnosis not present

## 2024-01-05 ENCOUNTER — Ambulatory Visit (INDEPENDENT_AMBULATORY_CARE_PROVIDER_SITE_OTHER): Admitting: Podiatry

## 2024-01-05 DIAGNOSIS — M7752 Other enthesopathy of left foot: Secondary | ICD-10-CM

## 2024-01-05 NOTE — Progress Notes (Signed)
 Subjective:  Patient ID: Darlene [name], female    DOB: August 20, 1951,  MRN: [id number]  Chief Complaint  Patient presents with   Foot Pain    Left foot pain pt stated she is having some discomfort on the outside of her ankle     73 y.o. female presents with the above complaint.  Patient presents with new complaint of left ankle pain.  She states that started hurting.  The medial part of the foot is doing better.  She denies any other acute complaints would like to discuss the treatment plan for the ankle pain that she is having pain scale 7 out of 10 dull aching nature hurts with ambulation hurts with pressure.   Review of Systems: Negative except as noted in the HPI. Denies N/V/F/Ch.  Past Medical History:  Diagnosis Date   Anxiety    Cataracts, bilateral    md just watching    Constipation    Depression    Elevated LFTs    history of, normal abdominal ultrasound, LFT's normal 01/2007   Gallbladder problem    History of chest pain    rulre out MI in 12/2006.   Hyperlipidemia    diet controlled   Hypertension    Obesity    Osteoarthritis of both knees 12/26/2007   Xray of knee 10/2007: Bilateral osteoarthritis, sent to PT but did not complete tx course.     Osteopenia 07/05/2015   Prediabetes 11/30/2015    Current Outpatient Medications:    acetaminophen  (TYLENOL ) 500 MG tablet, Take 1,000 mg by mouth every 8 (eight) hours as needed for moderate pain., Disp: , Rfl:    Biotin w/ Vitamins C & E (HAIR SKIN & NAILS GUMMIES PO), Take 2 capsules by mouth every other day., Disp: , Rfl:    Efinaconazole  10 % SOLN, Apply 1 drop topically daily. (Patient taking differently: Apply 1 drop topically every 30 (thirty) days.), Disp: 4 mL, Rfl: 11   hydrochlorothiazide  (HYDRODIURIL ) 12.5 MG tablet, Take 12.5 mg by mouth daily., Disp: , Rfl:    HYDROcodone -acetaminophen  (NORCO) 5-325 MG tablet, Take 1 tablet by mouth every 6 (six) hours as needed for moderate pain (pain score 4-6)., Disp:  30 tablet, Rfl: 0   HYDROcodone -acetaminophen  (NORCO/VICODIN) 5-325 MG tablet, Take 1-2 tablets by mouth every 6 (six) hours as needed for severe pain., Disp: 42 tablet, Rfl: 0   ibuprofen  (ADVIL ) 800 MG tablet, Take 1 tablet (800 mg total) by mouth every 6 (six) hours as needed., Disp: 60 tablet, Rfl: 1   ibuprofen  (ADVIL ) 800 MG tablet, Take 1 tablet (800 mg total) by mouth every 6 (six) hours as needed., Disp: 60 tablet, Rfl: 1   Magnesium  400 MG TABS, Take 400 mg by mouth in the morning., Disp: , Rfl:    potassium chloride  (KLOR-CON ) 10 MEQ tablet, Take 10 mEq by mouth every evening., Disp: , Rfl:    pravastatin  (PRAVACHOL ) 10 MG tablet, Take by mouth., Disp: , Rfl:    vitamin B-12 (CYANOCOBALAMIN) 1000 MCG tablet, Take 1,000 mcg by mouth every other day. In the morning, Disp: , Rfl:    Vitamin D , Ergocalciferol , (DRISDOL ) 1.25 MG (50000 UNIT) CAPS capsule, Take 1 capsule (50,000 Units total) by mouth every 7 (seven) days., Disp: 12 capsule, Rfl: 0  Social History   Tobacco Use  Smoking Status Never  Smokeless Tobacco Never    Allergies  Allergen Reactions   Trazodone  And Nefazodone Other (See Comments)    Bad headaches   Codeine  Nausea Only   Propoxyphene N-Acetaminophen      "makes me sick"   Tramadol      Other Reaction(s): other   Objective:  There were no vitals filed for this visit. There is no height or weight on file to calculate BMI. Constitutional Well developed. Well nourished.  Vascular Dorsalis pedis pulses palpable bilaterally. Posterior tibial pulses palpable bilaterally. Capillary refill normal to all digits.  No cyanosis or clubbing noted. Pedal hair growth normal.  Neurologic Normal speech. Oriented to person, place, and time. Epicritic sensation to light touch grossly present bilaterally.  Dermatologic Nails well groomed and normal in appearance. No open wounds. No skin lesions.  Orthopedic: Pain on palpation to the left ankle joint pain with range of  motion of the ankle joint no deep intra-articular ankle pain noted.  No pain at the posterior tibial tendon.  Mild pain at the insertion.  No pain at the Achilles tendon peroneal tendon   Radiographs: None Assessment:   1. Capsulitis of ankle, left    Plan:  Patient was evaluated and treated and all questions answered.  Left ankle capsulitis - All questions and concerns were discussed with the patient in extensive detail given the amount of pain that she is having she will benefit from steroid injection to help decrease inflammatory component associated pain.  Patient agrees with plan like to proceed with steroid injection -A steroid injection was performed at left ankle joint using 1% plain Lidocaine  and 10 mg of Kenalog . This was well tolerated.   No follow-ups on file.

## 2024-01-17 DIAGNOSIS — I10 Essential (primary) hypertension: Secondary | ICD-10-CM | POA: Diagnosis not present

## 2024-01-17 DIAGNOSIS — E785 Hyperlipidemia, unspecified: Secondary | ICD-10-CM | POA: Diagnosis not present

## 2024-01-17 DIAGNOSIS — E559 Vitamin D deficiency, unspecified: Secondary | ICD-10-CM | POA: Diagnosis not present

## 2024-01-17 DIAGNOSIS — R7303 Prediabetes: Secondary | ICD-10-CM | POA: Diagnosis not present

## 2024-02-07 DIAGNOSIS — E559 Vitamin D deficiency, unspecified: Secondary | ICD-10-CM | POA: Diagnosis not present

## 2024-02-07 DIAGNOSIS — R7303 Prediabetes: Secondary | ICD-10-CM | POA: Diagnosis not present

## 2024-02-07 DIAGNOSIS — E785 Hyperlipidemia, unspecified: Secondary | ICD-10-CM | POA: Diagnosis not present

## 2024-02-07 DIAGNOSIS — I10 Essential (primary) hypertension: Secondary | ICD-10-CM | POA: Diagnosis not present

## 2024-02-28 DIAGNOSIS — I10 Essential (primary) hypertension: Secondary | ICD-10-CM | POA: Diagnosis not present

## 2024-02-28 DIAGNOSIS — E785 Hyperlipidemia, unspecified: Secondary | ICD-10-CM | POA: Diagnosis not present

## 2024-02-28 DIAGNOSIS — R7303 Prediabetes: Secondary | ICD-10-CM | POA: Diagnosis not present

## 2024-02-28 DIAGNOSIS — E559 Vitamin D deficiency, unspecified: Secondary | ICD-10-CM | POA: Diagnosis not present

## 2024-03-05 DIAGNOSIS — E785 Hyperlipidemia, unspecified: Secondary | ICD-10-CM | POA: Diagnosis not present

## 2024-03-05 DIAGNOSIS — I10 Essential (primary) hypertension: Secondary | ICD-10-CM | POA: Diagnosis not present

## 2024-03-05 DIAGNOSIS — E559 Vitamin D deficiency, unspecified: Secondary | ICD-10-CM | POA: Diagnosis not present

## 2024-03-05 DIAGNOSIS — Z0000 Encounter for general adult medical examination without abnormal findings: Secondary | ICD-10-CM | POA: Diagnosis not present

## 2024-03-05 DIAGNOSIS — R7303 Prediabetes: Secondary | ICD-10-CM | POA: Diagnosis not present

## 2024-03-19 DIAGNOSIS — Z1231 Encounter for screening mammogram for malignant neoplasm of breast: Secondary | ICD-10-CM | POA: Diagnosis not present

## 2024-03-19 DIAGNOSIS — R92333 Mammographic heterogeneous density, bilateral breasts: Secondary | ICD-10-CM | POA: Diagnosis not present

## 2024-03-20 DIAGNOSIS — E559 Vitamin D deficiency, unspecified: Secondary | ICD-10-CM | POA: Diagnosis not present

## 2024-03-20 DIAGNOSIS — I10 Essential (primary) hypertension: Secondary | ICD-10-CM | POA: Diagnosis not present

## 2024-03-20 DIAGNOSIS — R7303 Prediabetes: Secondary | ICD-10-CM | POA: Diagnosis not present

## 2024-03-20 DIAGNOSIS — E785 Hyperlipidemia, unspecified: Secondary | ICD-10-CM | POA: Diagnosis not present

## 2024-04-10 ENCOUNTER — Ambulatory Visit (INDEPENDENT_AMBULATORY_CARE_PROVIDER_SITE_OTHER): Payer: 59 | Admitting: Podiatry

## 2024-04-10 DIAGNOSIS — M65972 Unspecified synovitis and tenosynovitis, left ankle and foot: Secondary | ICD-10-CM

## 2024-04-10 MED ORDER — MELOXICAM 15 MG PO TABS: 15.0000 mg | ORAL_TABLET | Freq: Every day | ORAL | 0 refills | Status: DC

## 2024-04-10 NOTE — Progress Notes (Signed)
 Subjective:  Patient ID: Darlene [name], female    DOB: 10-28-1950,  MRN: [id number]  Chief Complaint  Patient presents with   Foot Pain    6 month follow up pt stated that she is having some tenderness     73 y.o. female presents with the above complaint.  Patient presents with new complaint of left ankle pain.  She states that started hurting.  The medial part of the foot is doing better.  She denies any other acute complaints would like to discuss the treatment plan for the ankle pain that she is having pain scale 7 out of 10 dull aching nature hurts with ambulation hurts with pressure.   Review of Systems: Negative except as noted in the HPI. Denies N/V/F/Ch.  Past Medical History:  Diagnosis Date   Anxiety    Cataracts, bilateral    md just watching    Constipation    Depression    Elevated LFTs    history of, normal abdominal ultrasound, LFT's normal 01/2007   Gallbladder problem    History of chest pain    rulre out MI in 12/2006.   Hyperlipidemia    diet controlled   Hypertension    Obesity    Osteoarthritis of both knees 12/26/2007   Xray of knee 10/2007: Bilateral osteoarthritis, sent to PT but did not complete tx course.     Osteopenia 07/05/2015   Prediabetes 11/30/2015    Current Outpatient Medications:    meloxicam  (MOBIC ) 15 MG tablet, Take 1 tablet (15 mg total) by mouth daily., Disp: 30 tablet, Rfl: 0   acetaminophen  (TYLENOL ) 500 MG tablet, Take 1,000 mg by mouth every 8 (eight) hours as needed for moderate pain., Disp: , Rfl:    Biotin w/ Vitamins C & E (HAIR SKIN & NAILS GUMMIES PO), Take 2 capsules by mouth every other day., Disp: , Rfl:    Efinaconazole  10 % SOLN, Apply 1 drop topically daily. (Patient taking differently: Apply 1 drop topically every 30 (thirty) days.), Disp: 4 mL, Rfl: 11   hydrochlorothiazide  (HYDRODIURIL ) 12.5 MG tablet, Take 12.5 mg by mouth daily., Disp: , Rfl:    HYDROcodone -acetaminophen  (NORCO) 5-325 MG tablet, Take 1 tablet  by mouth every 6 (six) hours as needed for moderate pain (pain score 4-6)., Disp: 30 tablet, Rfl: 0   HYDROcodone -acetaminophen  (NORCO/VICODIN) 5-325 MG tablet, Take 1-2 tablets by mouth every 6 (six) hours as needed for severe pain., Disp: 42 tablet, Rfl: 0   ibuprofen  (ADVIL ) 800 MG tablet, Take 1 tablet (800 mg total) by mouth every 6 (six) hours as needed., Disp: 60 tablet, Rfl: 1   ibuprofen  (ADVIL ) 800 MG tablet, Take 1 tablet (800 mg total) by mouth every 6 (six) hours as needed., Disp: 60 tablet, Rfl: 1   Magnesium  400 MG TABS, Take 400 mg by mouth in the morning., Disp: , Rfl:    potassium chloride  (KLOR-CON ) 10 MEQ tablet, Take 10 mEq by mouth every evening., Disp: , Rfl:    pravastatin  (PRAVACHOL ) 10 MG tablet, Take by mouth., Disp: , Rfl:    vitamin B-12 (CYANOCOBALAMIN) 1000 MCG tablet, Take 1,000 mcg by mouth every other day. In the morning, Disp: , Rfl:    Vitamin D , Ergocalciferol , (DRISDOL ) 1.25 MG (50000 UNIT) CAPS capsule, Take 1 capsule (50,000 Units total) by mouth every 7 (seven) days., Disp: 12 capsule, Rfl: 0  Social History   Tobacco Use  Smoking Status Never  Smokeless Tobacco Never    Allergies  Allergen  Reactions   Trazodone  And Nefazodone Other (See Comments)    Bad headaches   Codeine Nausea Only   Propoxyphene N-Acetaminophen      makes me sick   Tramadol      Other Reaction(s): other   Objective:  There were no vitals filed for this visit. There is no height or weight on file to calculate BMI. Constitutional Well developed. Well nourished.  Vascular Dorsalis pedis pulses palpable bilaterally. Posterior tibial pulses palpable bilaterally. Capillary refill normal to all digits.  No cyanosis or clubbing noted. Pedal hair growth normal.  Neurologic Normal speech. Oriented to person, place, and time. Epicritic sensation to light touch grossly present bilaterally.  Dermatologic Nails well groomed and normal in appearance. No open wounds. No skin  lesions.  Orthopedic: Pain on palpation to the left ankle joint pain with range of motion of the ankle joint no deep intra-articular ankle pain noted.  No pain at the posterior tibial tendon.  Mild pain at the insertion.  No pain at the Achilles tendon peroneal tendon   Radiographs: None Assessment:   1. Synovitis of left ankle     Plan:  Patient was evaluated and treated and all questions answered.  Left ankle capsulitis - All questions and concerns were discussed with the patient in extensive detail given the amount of pain that she is having she will benefit from steroid injection to help decrease inflammatory component associated pain.  Patient agrees with plan like to proceed with steroid injection -A steroid injection was performed at left ankle joint using 1% plain Lidocaine  and 10 mg of Kenalog . This was well tolerated.   No follow-ups on file.

## 2024-04-24 DIAGNOSIS — I10 Essential (primary) hypertension: Secondary | ICD-10-CM | POA: Diagnosis not present

## 2024-04-24 DIAGNOSIS — E559 Vitamin D deficiency, unspecified: Secondary | ICD-10-CM | POA: Diagnosis not present

## 2024-04-24 DIAGNOSIS — E785 Hyperlipidemia, unspecified: Secondary | ICD-10-CM | POA: Diagnosis not present

## 2024-04-24 DIAGNOSIS — R7303 Prediabetes: Secondary | ICD-10-CM | POA: Diagnosis not present

## 2024-05-13 ENCOUNTER — Other Ambulatory Visit: Payer: Self-pay | Admitting: Podiatry

## 2024-05-22 DIAGNOSIS — R7303 Prediabetes: Secondary | ICD-10-CM | POA: Diagnosis not present

## 2024-05-22 DIAGNOSIS — E785 Hyperlipidemia, unspecified: Secondary | ICD-10-CM | POA: Diagnosis not present

## 2024-05-22 DIAGNOSIS — I10 Essential (primary) hypertension: Secondary | ICD-10-CM | POA: Diagnosis not present

## 2024-05-22 DIAGNOSIS — E559 Vitamin D deficiency, unspecified: Secondary | ICD-10-CM | POA: Diagnosis not present

## 2024-06-04 DIAGNOSIS — R21 Rash and other nonspecific skin eruption: Secondary | ICD-10-CM | POA: Diagnosis not present

## 2024-06-12 DIAGNOSIS — H04123 Dry eye syndrome of bilateral lacrimal glands: Secondary | ICD-10-CM | POA: Diagnosis not present

## 2024-06-12 DIAGNOSIS — H16223 Keratoconjunctivitis sicca, not specified as Sjogren's, bilateral: Secondary | ICD-10-CM | POA: Diagnosis not present

## 2024-06-19 DIAGNOSIS — E785 Hyperlipidemia, unspecified: Secondary | ICD-10-CM | POA: Diagnosis not present

## 2024-06-19 DIAGNOSIS — R7303 Prediabetes: Secondary | ICD-10-CM | POA: Diagnosis not present

## 2024-06-19 DIAGNOSIS — I10 Essential (primary) hypertension: Secondary | ICD-10-CM | POA: Diagnosis not present

## 2024-06-19 DIAGNOSIS — E559 Vitamin D deficiency, unspecified: Secondary | ICD-10-CM | POA: Diagnosis not present
